# Patient Record
Sex: Male | Born: 1955
Health system: Southern US, Community
[De-identification: ages and names within clinical notes are randomized; demographics above are authoritative.]

## PROBLEM LIST (undated history)

## (undated) DIAGNOSIS — E1142 Type 2 diabetes mellitus with diabetic polyneuropathy: Secondary | ICD-10-CM

## (undated) DIAGNOSIS — IMO0001 Reserved for inherently not codable concepts without codable children: Secondary | ICD-10-CM

## (undated) DIAGNOSIS — E119 Type 2 diabetes mellitus without complications: Secondary | ICD-10-CM

## (undated) DIAGNOSIS — I6381 Other cerebral infarction due to occlusion or stenosis of small artery: Secondary | ICD-10-CM

## (undated) DIAGNOSIS — R059 Cough, unspecified: Secondary | ICD-10-CM

## (undated) DIAGNOSIS — J189 Pneumonia, unspecified organism: Secondary | ICD-10-CM

## (undated) DIAGNOSIS — F419 Anxiety disorder, unspecified: Secondary | ICD-10-CM

## (undated) DIAGNOSIS — Z8739 Personal history of other diseases of the musculoskeletal system and connective tissue: Secondary | ICD-10-CM

## (undated) DIAGNOSIS — C801 Malignant (primary) neoplasm, unspecified: Secondary | ICD-10-CM

## (undated) DIAGNOSIS — R05 Cough: Secondary | ICD-10-CM

## (undated) DIAGNOSIS — F329 Major depressive disorder, single episode, unspecified: Secondary | ICD-10-CM

## (undated) DIAGNOSIS — M1990 Unspecified osteoarthritis, unspecified site: Secondary | ICD-10-CM

## (undated) DIAGNOSIS — I639 Cerebral infarction, unspecified: Secondary | ICD-10-CM

## (undated) DIAGNOSIS — Z9989 Dependence on other enabling machines and devices: Secondary | ICD-10-CM

## (undated) DIAGNOSIS — Z86010 Personal history of colonic polyps: Principal | ICD-10-CM

## (undated) DIAGNOSIS — R55 Syncope and collapse: Secondary | ICD-10-CM

## (undated) DIAGNOSIS — F32A Depression, unspecified: Secondary | ICD-10-CM

## (undated) DIAGNOSIS — N189 Chronic kidney disease, unspecified: Secondary | ICD-10-CM

## (undated) DIAGNOSIS — E785 Hyperlipidemia, unspecified: Secondary | ICD-10-CM

## (undated) DIAGNOSIS — G4733 Obstructive sleep apnea (adult) (pediatric): Secondary | ICD-10-CM

## (undated) DIAGNOSIS — I10 Essential (primary) hypertension: Secondary | ICD-10-CM

## (undated) HISTORY — PX: KNEE ARTHROSCOPY: SHX127

## (undated) HISTORY — DX: Personal history of colonic polyps: Z86.010

## (undated) HISTORY — PX: HERNIA REPAIR: SHX51

---

## 1978-10-20 HISTORY — PX: APPENDECTOMY: SHX54

## 2002-02-18 DIAGNOSIS — E119 Type 2 diabetes mellitus without complications: Secondary | ICD-10-CM

## 2002-02-18 HISTORY — DX: Type 2 diabetes mellitus without complications: E11.9

## 2002-09-03 DIAGNOSIS — R7303 Prediabetes: Secondary | ICD-10-CM

## 2004-07-11 ENCOUNTER — Emergency Department (HOSPITAL_COMMUNITY): Admission: EM | Admit: 2004-07-11 | Discharge: 2004-07-11 | Payer: Self-pay | Admitting: Family Medicine

## 2004-07-23 ENCOUNTER — Ambulatory Visit: Payer: Self-pay | Admitting: *Deleted

## 2004-07-23 ENCOUNTER — Ambulatory Visit: Payer: Self-pay | Admitting: Internal Medicine

## 2004-08-06 ENCOUNTER — Ambulatory Visit: Payer: Self-pay | Admitting: Internal Medicine

## 2004-08-31 ENCOUNTER — Ambulatory Visit: Payer: Self-pay | Admitting: Internal Medicine

## 2004-11-02 ENCOUNTER — Ambulatory Visit: Payer: Self-pay | Admitting: Internal Medicine

## 2005-01-15 ENCOUNTER — Ambulatory Visit: Payer: Self-pay | Admitting: Internal Medicine

## 2005-04-23 ENCOUNTER — Ambulatory Visit: Payer: Self-pay | Admitting: Internal Medicine

## 2005-05-23 ENCOUNTER — Ambulatory Visit: Payer: Self-pay | Admitting: Internal Medicine

## 2005-06-27 ENCOUNTER — Ambulatory Visit: Payer: Self-pay | Admitting: Internal Medicine

## 2005-09-06 ENCOUNTER — Ambulatory Visit: Payer: Self-pay | Admitting: Internal Medicine

## 2005-10-18 ENCOUNTER — Ambulatory Visit: Payer: Self-pay | Admitting: Family Medicine

## 2005-11-01 ENCOUNTER — Ambulatory Visit: Payer: Self-pay | Admitting: Internal Medicine

## 2005-12-11 ENCOUNTER — Ambulatory Visit: Payer: Self-pay | Admitting: Internal Medicine

## 2006-01-21 ENCOUNTER — Ambulatory Visit: Payer: Self-pay | Admitting: Internal Medicine

## 2006-03-18 ENCOUNTER — Ambulatory Visit: Payer: Self-pay | Admitting: Internal Medicine

## 2006-03-21 ENCOUNTER — Ambulatory Visit: Payer: Self-pay | Admitting: Internal Medicine

## 2006-05-05 ENCOUNTER — Ambulatory Visit (HOSPITAL_BASED_OUTPATIENT_CLINIC_OR_DEPARTMENT_OTHER): Admission: RE | Admit: 2006-05-05 | Discharge: 2006-05-05 | Payer: Self-pay | Admitting: Internal Medicine

## 2006-05-11 ENCOUNTER — Ambulatory Visit: Payer: Self-pay | Admitting: Internal Medicine

## 2006-06-24 ENCOUNTER — Ambulatory Visit (HOSPITAL_BASED_OUTPATIENT_CLINIC_OR_DEPARTMENT_OTHER): Admission: RE | Admit: 2006-06-24 | Discharge: 2006-06-24 | Payer: Self-pay | Admitting: Internal Medicine

## 2006-06-28 ENCOUNTER — Ambulatory Visit: Payer: Self-pay | Admitting: Internal Medicine

## 2006-08-19 ENCOUNTER — Ambulatory Visit: Payer: Self-pay | Admitting: Internal Medicine

## 2006-11-05 ENCOUNTER — Encounter (INDEPENDENT_AMBULATORY_CARE_PROVIDER_SITE_OTHER): Payer: Self-pay | Admitting: *Deleted

## 2007-03-12 ENCOUNTER — Ambulatory Visit: Payer: Self-pay | Admitting: Internal Medicine

## 2007-04-23 ENCOUNTER — Ambulatory Visit: Payer: Self-pay | Admitting: Internal Medicine

## 2007-07-16 ENCOUNTER — Ambulatory Visit: Payer: Self-pay | Admitting: Internal Medicine

## 2007-07-16 LAB — CONVERTED CEMR LAB
Albumin: 4.6 g/dL (ref 3.5–5.2)
Alkaline Phosphatase: 80 units/L (ref 39–117)
CO2: 24 meq/L (ref 19–32)
Cholesterol: 170 mg/dL (ref 0–200)
Glucose, Bld: 134 mg/dL — ABNORMAL HIGH (ref 70–99)
Microalb, Ur: 0.74 mg/dL (ref 0.00–1.89)
Total Bilirubin: 0.3 mg/dL (ref 0.3–1.2)

## 2007-08-25 ENCOUNTER — Ambulatory Visit: Payer: Self-pay | Admitting: Internal Medicine

## 2007-10-06 ENCOUNTER — Ambulatory Visit: Payer: Self-pay | Admitting: Internal Medicine

## 2007-11-19 ENCOUNTER — Ambulatory Visit: Payer: Self-pay | Admitting: Internal Medicine

## 2007-11-19 HISTORY — PX: CAROTID ENDARTERECTOMY: SUR193

## 2007-11-23 ENCOUNTER — Ambulatory Visit: Payer: Self-pay | Admitting: Internal Medicine

## 2007-11-25 ENCOUNTER — Ambulatory Visit: Payer: Self-pay | Admitting: *Deleted

## 2007-11-25 ENCOUNTER — Inpatient Hospital Stay (HOSPITAL_COMMUNITY): Admission: EM | Admit: 2007-11-25 | Discharge: 2007-11-28 | Payer: Self-pay | Admitting: Emergency Medicine

## 2007-11-26 ENCOUNTER — Ambulatory Visit: Payer: Self-pay | Admitting: Vascular Surgery

## 2007-11-26 ENCOUNTER — Encounter (INDEPENDENT_AMBULATORY_CARE_PROVIDER_SITE_OTHER): Payer: Self-pay | Admitting: *Deleted

## 2007-11-27 ENCOUNTER — Encounter: Payer: Self-pay | Admitting: Vascular Surgery

## 2007-12-08 ENCOUNTER — Ambulatory Visit: Payer: Self-pay | Admitting: Vascular Surgery

## 2007-12-10 ENCOUNTER — Ambulatory Visit: Payer: Self-pay | Admitting: Internal Medicine

## 2008-03-10 ENCOUNTER — Ambulatory Visit: Payer: Self-pay | Admitting: Internal Medicine

## 2008-06-02 ENCOUNTER — Ambulatory Visit: Payer: Self-pay | Admitting: Internal Medicine

## 2008-06-02 LAB — CONVERTED CEMR LAB
ALT: 24 units/L (ref 0–53)
AST: 17 units/L (ref 0–37)
Albumin: 4.3 g/dL (ref 3.5–5.2)
Alkaline Phosphatase: 98 units/L (ref 39–117)
BUN: 19 mg/dL (ref 6–23)
Chloride: 106 meq/L (ref 96–112)
Ferritin: 47 ng/mL (ref 22–322)
Glucose, Bld: 68 mg/dL — ABNORMAL LOW (ref 70–99)
Iron: 71 ug/dL (ref 42–165)
LDL Cholesterol: 75 mg/dL (ref 0–99)
Magnesium: 2.1 mg/dL (ref 1.5–2.5)
Potassium: 5 meq/L (ref 3.5–5.3)
Saturation Ratios: 17 % — ABNORMAL LOW (ref 20–55)
Sodium: 141 meq/L (ref 135–145)
Total CHOL/HDL Ratio: 4.2
VLDL: 42 mg/dL — ABNORMAL HIGH (ref 0–40)
Vit D, 25-Hydroxy: 27 ng/mL — ABNORMAL LOW (ref 30–89)

## 2008-09-01 ENCOUNTER — Ambulatory Visit: Payer: Self-pay | Admitting: Internal Medicine

## 2008-10-27 ENCOUNTER — Ambulatory Visit: Payer: Self-pay | Admitting: Internal Medicine

## 2008-11-04 ENCOUNTER — Ambulatory Visit: Payer: Self-pay | Admitting: Family Medicine

## 2008-11-04 ENCOUNTER — Ambulatory Visit (HOSPITAL_COMMUNITY): Admission: RE | Admit: 2008-11-04 | Discharge: 2008-11-04 | Payer: Self-pay | Admitting: Internal Medicine

## 2008-11-04 ENCOUNTER — Encounter (INDEPENDENT_AMBULATORY_CARE_PROVIDER_SITE_OTHER): Payer: Self-pay | Admitting: Internal Medicine

## 2008-11-04 LAB — CONVERTED CEMR LAB
Basophils Absolute: 0 10*3/uL (ref 0.0–0.1)
Basophils Relative: 0 % (ref 0–1)
Eosinophils Relative: 2 % (ref 0–5)
HCT: 46.9 % (ref 39.0–52.0)
MCHC: 31.3 g/dL (ref 30.0–36.0)
MCV: 96.7 fL (ref 78.0–100.0)
Monocytes Absolute: 0.7 10*3/uL (ref 0.1–1.0)
Monocytes Relative: 7 % (ref 3–12)
Neutro Abs: 7.6 10*3/uL (ref 1.7–7.7)
Neutrophils Relative %: 72 % (ref 43–77)
WBC: 10.5 10*3/uL (ref 4.0–10.5)

## 2008-11-05 ENCOUNTER — Encounter (INDEPENDENT_AMBULATORY_CARE_PROVIDER_SITE_OTHER): Payer: Self-pay | Admitting: Internal Medicine

## 2008-11-07 ENCOUNTER — Ambulatory Visit: Payer: Self-pay | Admitting: Internal Medicine

## 2008-11-07 ENCOUNTER — Emergency Department (HOSPITAL_COMMUNITY): Admission: EM | Admit: 2008-11-07 | Discharge: 2008-11-07 | Payer: Self-pay | Admitting: Emergency Medicine

## 2008-11-10 ENCOUNTER — Ambulatory Visit: Payer: Self-pay | Admitting: Internal Medicine

## 2008-11-23 ENCOUNTER — Ambulatory Visit: Payer: Self-pay | Admitting: Internal Medicine

## 2008-11-23 LAB — CONVERTED CEMR LAB: Calcium: 9.5 mg/dL (ref 8.4–10.5)

## 2008-12-01 ENCOUNTER — Ambulatory Visit: Payer: Self-pay | Admitting: Internal Medicine

## 2009-03-16 ENCOUNTER — Ambulatory Visit: Payer: Self-pay | Admitting: Internal Medicine

## 2009-03-16 LAB — CONVERTED CEMR LAB
Cholesterol: 161 mg/dL (ref 0–200)
LDL Cholesterol: 89 mg/dL (ref 0–99)
Triglycerides: 227 mg/dL — ABNORMAL HIGH (ref <150)

## 2009-03-20 ENCOUNTER — Ambulatory Visit: Payer: Self-pay | Admitting: Internal Medicine

## 2009-05-24 ENCOUNTER — Ambulatory Visit: Payer: Self-pay | Admitting: Internal Medicine

## 2009-06-15 ENCOUNTER — Ambulatory Visit: Payer: Self-pay | Admitting: Vascular Surgery

## 2009-07-31 ENCOUNTER — Telehealth (INDEPENDENT_AMBULATORY_CARE_PROVIDER_SITE_OTHER): Payer: Self-pay | Admitting: *Deleted

## 2009-07-31 ENCOUNTER — Inpatient Hospital Stay (HOSPITAL_COMMUNITY)
Admission: EM | Admit: 2009-07-31 | Discharge: 2009-08-03 | Payer: Self-pay | Source: Home / Self Care | Admitting: Emergency Medicine

## 2009-07-31 ENCOUNTER — Ambulatory Visit: Payer: Self-pay | Admitting: Cardiology

## 2009-08-03 ENCOUNTER — Encounter: Payer: Self-pay | Admitting: Internal Medicine

## 2009-08-28 ENCOUNTER — Ambulatory Visit: Payer: Self-pay | Admitting: Internal Medicine

## 2010-02-06 ENCOUNTER — Encounter (INDEPENDENT_AMBULATORY_CARE_PROVIDER_SITE_OTHER): Payer: Self-pay | Admitting: Family Medicine

## 2010-02-06 LAB — CONVERTED CEMR LAB
ALT: 19 units/L (ref 0–53)
AST: 18 units/L (ref 0–37)
Albumin: 4.3 g/dL (ref 3.5–5.2)
Alkaline Phosphatase: 100 units/L (ref 39–117)
Cholesterol: 157 mg/dL (ref 0–200)
LDL Cholesterol: 94 mg/dL (ref 0–99)
Potassium: 5 meq/L (ref 3.5–5.3)
Sodium: 144 meq/L (ref 135–145)
Total CHOL/HDL Ratio: 5.1
Total Protein: 6.9 g/dL (ref 6.0–8.3)
Triglycerides: 158 mg/dL — ABNORMAL HIGH (ref <150)

## 2010-02-20 ENCOUNTER — Ambulatory Visit (HOSPITAL_BASED_OUTPATIENT_CLINIC_OR_DEPARTMENT_OTHER)
Admission: RE | Admit: 2010-02-20 | Discharge: 2010-02-20 | Payer: Self-pay | Source: Home / Self Care | Attending: Family Medicine | Admitting: Family Medicine

## 2010-02-21 ENCOUNTER — Ambulatory Visit (HOSPITAL_COMMUNITY)
Admission: RE | Admit: 2010-02-21 | Discharge: 2010-02-21 | Payer: Self-pay | Source: Home / Self Care | Attending: Family Medicine | Admitting: Family Medicine

## 2010-03-11 ENCOUNTER — Encounter: Payer: Self-pay | Admitting: *Deleted

## 2010-03-20 NOTE — Progress Notes (Signed)
Summary: triage/chest and left shoulder pain  Phone Note Call from Patient   Caller: Spouse Reason for Call: Talk to Nurse Summary of Call: patient's wife states her husband  has been hurting day and night in his left shoulder. Patient is not aware of any injuries or strains to shoulder. He has a history of stroke and states the pain radiates to his chest at times with severe chest pain and he becomes SOB.Marland KitchenMarland KitchenHe says his left hand feels like it goes to sleep alot.. Wife advised to take patient to ED for evaluation.  She stated she would do so. Initial call taken by: Conchita Paris,  July 31, 2009 2:40 PM

## 2010-03-20 NOTE — Consult Note (Signed)
Summary: [facility]  [facility]   Imported By: Marylou Mccoy 09/12/2009 16:23:34  _____________________________________________________________________  External Attachment:    Type:   Image     Comment:   External Document

## 2010-04-13 ENCOUNTER — Other Ambulatory Visit: Payer: Self-pay | Admitting: Emergency Medicine

## 2010-04-16 ENCOUNTER — Encounter: Payer: Self-pay | Admitting: Internal Medicine

## 2010-04-16 LAB — CONVERTED CEMR LAB
Amylase: 64 units/L (ref 0–105)
Helicobacter Pylori Antibody-IgG: 3.41 — ABNORMAL HIGH
Lipase: 45 units/L (ref 0–75)

## 2010-04-24 ENCOUNTER — Ambulatory Visit
Admission: RE | Admit: 2010-04-24 | Discharge: 2010-04-24 | Disposition: A | Discharge status: Home / Self Care | Payer: Self-pay | Source: Ambulatory Visit | Attending: Internal Medicine | Admitting: Internal Medicine

## 2010-05-06 LAB — BASIC METABOLIC PANEL
CO2: 28 mEq/L (ref 19–32)
Calcium: 9.5 mg/dL (ref 8.4–10.5)
Chloride: 105 mEq/L (ref 96–112)
GFR calc Af Amer: >60 mL/min (ref >60)
Sodium: 138 mEq/L (ref 135–145)

## 2010-05-06 LAB — COMPREHENSIVE METABOLIC PANEL
AST: 21 U/L (ref 0–37)
Albumin: 3.4 g/dL — ABNORMAL LOW (ref 3.5–5.2)
CO2: 28 mEq/L (ref 19–32)
Calcium: 9.6 mg/dL (ref 8.4–10.5)
Chloride: 106 mEq/L (ref 96–112)
Creatinine, Ser: 1.36 mg/dL (ref 0.4–1.5)
GFR calc Af Amer: >60 mL/min (ref >60)
GFR calc non Af Amer: 55 mL/min — ABNORMAL LOW (ref >60)
Potassium: 5.3 mEq/L — ABNORMAL HIGH (ref 3.5–5.1)
Total Protein: 6.8 g/dL (ref 6.0–8.3)

## 2010-05-06 LAB — CBC
HCT: 45.6 % (ref 39.0–52.0)
MCV: 94 fL (ref 78.0–100.0)
RBC: 4.69 MIL/uL (ref 4.22–5.81)
RDW: 14.3 % (ref 11.5–15.5)
WBC: 10.3 10*3/uL (ref 4.0–10.5)
WBC: 12.9 10*3/uL — ABNORMAL HIGH (ref 4.0–10.5)

## 2010-05-06 LAB — GLUCOSE, CAPILLARY
Glucose-Capillary: 139 mg/dL — ABNORMAL HIGH (ref 70–99)
Glucose-Capillary: 76 mg/dL (ref 70–99)

## 2010-05-06 LAB — PROTIME-INR: Prothrombin Time: 12.9 seconds (ref 11.6–15.2)

## 2010-05-07 LAB — DIFFERENTIAL
Eosinophils Absolute: 0.3 10*3/uL (ref 0.0–0.7)
Lymphocytes Relative: 19 % (ref 12–46)
Lymphs Abs: 1.9 10*3/uL (ref 0.7–4.0)
Monocytes Relative: 6 % (ref 3–12)
Neutrophils Relative %: 72 % (ref 43–77)

## 2010-05-07 LAB — CARDIAC PANEL(CRET KIN+CKTOT+MB+TROPI)
CK, MB: 1.9 ng/mL (ref 0.3–4.0)
Relative Index: INVALID (ref 0.0–2.5)
Relative Index: INVALID (ref 0.0–2.5)
Troponin I: 0.02 ng/mL (ref 0.00–0.06)
Troponin I: 0.02 ng/mL (ref 0.00–0.06)

## 2010-05-07 LAB — GLUCOSE, CAPILLARY
Glucose-Capillary: 168 mg/dL — ABNORMAL HIGH (ref 70–99)
Glucose-Capillary: 76 mg/dL (ref 70–99)
Glucose-Capillary: 85 mg/dL (ref 70–99)
Glucose-Capillary: 93 mg/dL (ref 70–99)
Glucose-Capillary: 99 mg/dL (ref 70–99)

## 2010-05-07 LAB — DRUGS OF ABUSE SCREEN W/O ALC, ROUTINE URINE
Amphetamine Screen, Ur: NEGATIVE
Barbiturate Quant, Ur: NEGATIVE
Benzodiazepines.: POSITIVE — AB
Cocaine Metabolites: NEGATIVE
Marijuana Metabolite: POSITIVE — AB
Opiate Screen, Urine: NEGATIVE

## 2010-05-07 LAB — BLOOD GAS, ARTERIAL
Acid-Base Excess: 0.3 mmol/L (ref 0.0–2.0)
Bicarbonate: 25.2 mEq/L — ABNORMAL HIGH (ref 20.0–24.0)
FIO2: 0.21 %
O2 Saturation: 93.3 %
Patient temperature: 98.6
pO2, Arterial: 67.2 mmHg — ABNORMAL LOW (ref 80.0–100.0)

## 2010-05-07 LAB — THC (MARIJUANA), URINE, CONFIRMATION: Marijuana, Ur-Confirmation: 38 NG/ML — ABNORMAL HIGH

## 2010-05-07 LAB — CK TOTAL AND CKMB (NOT AT ARMC): Relative Index: INVALID (ref 0.0–2.5)

## 2010-05-07 LAB — LIPID PANEL
HDL: 31 mg/dL — ABNORMAL LOW (ref >39)
Total CHOL/HDL Ratio: 5.3 RATIO
VLDL: 69 mg/dL — ABNORMAL HIGH (ref 0–40)

## 2010-05-07 LAB — HEPARIN LEVEL (UNFRACTIONATED): Heparin Unfractionated: <0.1 IU/mL — ABNORMAL LOW (ref 0.30–0.70)

## 2010-05-07 LAB — BASIC METABOLIC PANEL
Chloride: 106 mEq/L (ref 96–112)
Creatinine, Ser: 1.87 mg/dL — ABNORMAL HIGH (ref 0.4–1.5)
GFR calc Af Amer: 46 mL/min — ABNORMAL LOW (ref >60)
GFR calc non Af Amer: 38 mL/min — ABNORMAL LOW (ref >60)
Potassium: 4.6 mEq/L (ref 3.5–5.1)

## 2010-05-07 LAB — HEMOGLOBIN A1C: Hgb A1c MFr Bld: 5.5 % (ref <5.7)

## 2010-05-07 LAB — POCT CARDIAC MARKERS
CKMB, poc: 1.2 ng/mL (ref 1.0–8.0)
Myoglobin, poc: 80.9 ng/mL (ref 12–200)
Troponin i, poc: <0.05 ng/mL (ref 0.00–0.09)

## 2010-05-07 LAB — CBC
MCV: 93.5 fL (ref 78.0–100.0)
RBC: 4.74 MIL/uL (ref 4.22–5.81)
WBC: 10.5 10*3/uL (ref 4.0–10.5)

## 2010-05-07 LAB — BENZODIAZEPINE, QUANTITATIVE, URINE: Lorazepam UR QT: NEGATIVE NG/ML

## 2010-05-07 LAB — MRSA PCR SCREENING: MRSA by PCR: NEGATIVE

## 2010-05-07 LAB — TSH: TSH: 2.906 u[IU]/mL (ref 0.350–4.500)

## 2010-05-25 LAB — CBC
HCT: 44.8 % (ref 39.0–52.0)
MCHC: 32.6 g/dL (ref 30.0–36.0)
MCV: 93.5 fL (ref 78.0–100.0)
Platelets: 219 10*3/uL (ref 150–400)
WBC: 10.7 10*3/uL — ABNORMAL HIGH (ref 4.0–10.5)

## 2010-05-25 LAB — WOUND CULTURE: Gram Stain: NONE SEEN

## 2010-05-25 LAB — POCT I-STAT, CHEM 8
BUN: 30 mg/dL — ABNORMAL HIGH (ref 6–23)
Calcium, Ion: 1.09 mmol/L — ABNORMAL LOW (ref 1.12–1.32)
Chloride: 108 mEq/L (ref 96–112)
HCT: 47 % (ref 39.0–52.0)
Potassium: 4.6 mEq/L (ref 3.5–5.1)

## 2010-05-25 LAB — DIFFERENTIAL
Basophils Absolute: 0 10*3/uL (ref 0.0–0.1)
Eosinophils Absolute: 0.2 10*3/uL (ref 0.0–0.7)
Eosinophils Relative: 2 % (ref 0–5)
Lymphs Abs: 1.7 10*3/uL (ref 0.7–4.0)

## 2010-06-07 ENCOUNTER — Other Ambulatory Visit (INDEPENDENT_AMBULATORY_CARE_PROVIDER_SITE_OTHER): Payer: Self-pay

## 2010-06-07 DIAGNOSIS — I6529 Occlusion and stenosis of unspecified carotid artery: Secondary | ICD-10-CM

## 2010-06-07 DIAGNOSIS — Z48812 Encounter for surgical aftercare following surgery on the circulatory system: Secondary | ICD-10-CM

## 2010-06-13 NOTE — Procedures (Unsigned)
CAROTID DUPLEX EXAM  INDICATION:  Carotid endarterectomy.  HISTORY: Diabetes:  Yes. Cardiac:  No. Hypertension:  Yes. Smoking:  Yes. Previous Surgery:  Left carotid endarterectomy on 11/27/2007. CV History:  History of possible stroke prior to surgery, currently asymptomatic. Amaurosis Fugax No, Paresthesias No, Hemiparesis No                                      RIGHT             LEFT Brachial systolic pressure:         130               142 Brachial Doppler waveforms:         Normal            Normal Vertebral direction of flow:        Antegrade         Antegrade DUPLEX VELOCITIES (cm/sec) CCA peak systolic                   67                72 ECA peak systolic                   141               104 ICA peak systolic                   78                57 ICA end diastolic                   28                23 PLAQUE MORPHOLOGY:                  Heterogeneous PLAQUE AMOUNT:                      Mild              None PLAQUE LOCATION:                    ICA / bifurcation  IMPRESSION: 1. No hemodynamically significant stenosis of the right proximal     internal carotid artery with plaque formations as described above. 2. Patent left carotid endarterectomy site with no left internal     carotid artery stenosis. 3. No significant change noted when compared to the previous exam on     06/15/2009.  ___________________________________________ Di Kindle. Edilia Bo, M.D.  CH/MEDQ  D:  06/08/2010  T:  06/08/2010  Job:  [id number]

## 2010-07-03 NOTE — Procedures (Signed)
CAROTID DUPLEX EXAM   INDICATION:  Follow up carotid artery disease.   HISTORY:  Diabetes:  Yes.  Cardiac:  No.  Hypertension:  Yes.  Smoking:  Quit.  Previous Surgery:  Left CEA with DPA on 11/27/07 by Dr. Edilia Bo.  CV History:  Right-sided stroke, still symptomatic; however, improving,  per patient.  Amaurosis Fugax No, Paresthesias Yes, Hemiparesis Yes.                                       RIGHT             LEFT  Brachial systolic pressure:         142               140  Brachial Doppler waveforms:         WNL               WNL  Vertebral direction of flow:        Not visualized    Antegrade  DUPLEX VELOCITIES (cm/sec)  CCA peak systolic                   128               122  ECA peak systolic                   166               255  ICA peak systolic                   118               135 (distal)  ICA end diastolic                   51                45  PLAQUE MORPHOLOGY:                  Homogenous        N/A  PLAQUE AMOUNT:                      Mild              N/A  PLAQUE LOCATION:                    ICA               ECA   IMPRESSION:  1. Right internal carotid artery velocities are suggestive of 40-59%      stenosis (low end of range; however, no significant plaque      visualized).  2. Left internal carotid artery velocities distal to patch are      suggestive of 40-59% stenosis (low end of range; however, no plaque      visualized).  No evidence of restenosis in patch area.  3. Left external carotid artery stenosis.  4. Left vertebral artery is antegrade.  Right is not visualized;      however, may be due to body habitus.   ___________________________________________  Di Kindle. Edilia Bo, M.D.   AS/MEDQ  D:  06/07/2008  T:  06/07/2008  Job:  [id number]

## 2010-07-03 NOTE — Procedures (Signed)
CAROTID DUPLEX EXAM   INDICATION:  Followup known carotid artery disease.   HISTORY:  Diabetes:  Yes.  Cardiac:  No.  Hypertension:  Yes.  Smoking:  Yes.  Previous Surgery:  Left carotid endarterectomy with DPA on 11/27/2007 by  Dr. Edilia Bo.  CV History:  CVA right-sided, still symptomatic but improving.  Amaurosis Fugax No, Paresthesias Yes, Hemiparesis Yes                                       RIGHT             LEFT  Brachial systolic pressure:         100               120  Brachial Doppler waveforms:         Triphasic         Triphasic  Vertebral direction of flow:        Unable to visualize                 Antegrade  DUPLEX VELOCITIES (cm/sec)  CCA peak systolic                   81                104  ECA peak systolic                   171               293  ICA peak systolic                   117               113  ICA end diastolic                   54                51  PLAQUE MORPHOLOGY:                  Homogeneous       NA  PLAQUE AMOUNT:                      Mild              NA  PLAQUE LOCATION:                    ICA               ECA   IMPRESSION:  1. Right internal carotid artery velocities are suggestive of 40%-59%      stenosis (low end of range; however, no significant plaque      visualized).  2. Left internal carotid artery velocities distal to the patch are      suggestive of 40%-59% stenosis (low end of range; however, no      plaque visualized).  No evidence of restenosis in patch area.  3. Left external carotid artery stenosis.  4. Left vertebral is antegrade.  Right is not visualized, however, may      be due to body habitus.  5. Noticeable difference in blood pressures with a drop on the right      side.       ___________________________________________  Di Kindle. Edilia Bo, M.D.  NT/MEDQ  D:  06/15/2009  T:  06/15/2009  Job:  [id number]

## 2010-07-03 NOTE — Assessment & Plan Note (Signed)
OFFICE VISIT   [name], [name]  DOB:  1955/08/26                                       12/08/2007  MVHQI#:[id number]   I saw the patient in the office today for followup after his recent left  carotid endarterectomy.  This is a pleasant 55 year old gentleman who  had an episode of dysarthria which lasted approximately 15 minutes and  resolved.  He had a subsequent episode and was admitted when he  developed right upper extremity weakness and clumsiness on 11/26/2007.  He was fount to have a evidence of stroke in the left frontal parietal  region on his CT scan.  Carotid duplex scan showed a greater than 80%  left carotid stenosis.  Left carotid endarterectomy was recommended in  order to lower his risk of future stroke.   He underwent a left carotid endarterectomy with Dacron patch angioplasty  and 11/27/2007.  He did well postoperatively and was discharged on  postoperative day #1.  Returns for his first followup visit.  Overall he  has been doing quite well and states that the strength in his upper  extremity is improving on the right.  He has had no focal weakness or  paresthesias.   PHYSICAL EXAMINATION:  Blood pressure is 137/82, heart rate is 104.  His  incision is healing nicely.  His strength in the right upper extremity  has improved.  Lungs are clear bilaterally to auscultation.  Cardiac  exam, he has a regular rate and rhythm.   Overall, I am pleased with his progress.  I will see him back in 6  months for a followup duplex scan.  He knows to call sooner if he has  problems.  In the meantime he knows to continue taking his aspirin. Of  note, he has been on Aggrenox and I have told him, from my standpoint,  he does not need to continue with his Aggrenox.  He will discuss this  with Dr. Reche Dixon also.   Di Kindle. Edilia Bo, M.D.  Electronically Signed   CSD/MEDQ  D:  12/08/2007  T:  12/09/2007  Job:  [id number]   cc:   Dineen Kid. Reche Dixon,  M.D.

## 2010-07-03 NOTE — Assessment & Plan Note (Signed)
OFFICE VISIT   [name], [name]  DOB:  1955/10/09                                       06/07/2008  [id number]#:[id number]   I saw the patient in the office today for continued followup of his  carotid disease.  He had undergone a left carotid endarterectomy in  October of 2009 for symptomatic left carotid stenosis.  He comes in for  a 6 month followup visit.  Since I saw him last he has had no history of  stroke, TIAs, expressive or receptive aphasia or amaurosis fugax.  This  has been no significant change in his medical history since October of  2009.   REVIEW OF SYSTEMS:  He has had no recent chest pain, chest pressure,  palpitations or arrhythmias.  He does admit to dyspnea on exertion.   PHYSICAL EXAMINATION:  This is a pleasant 55 year old gentleman who  appears his stated age.  He has morbid obesity.  Blood pressure is  159/86, heart rate is 90.  Neck is supple.  I do not detect any carotid  bruits.  His lungs are clear bilaterally to auscultation.  On cardiac  exam he has a regular rate and rhythm.  Neurologic exam is nonfocal.   Carotid duplex scan shows no evidence of recurrent carotid stenosis on  the left.  He has a 40-59% right carotid stenosis.   As he is asymptomatic he understands we would not consider right carotid  endarterectomy unless the stenosis progressed to greater than 80%.  I  will see him back in 1 year for followup duplex scan.  He knows to call  sooner if he has problems.  In the meantime he knows to continue taking  his aspirin.   Di Kindle. Edilia Bo, M.D.  Electronically Signed   CSD/MEDQ  D:  06/07/2008  T:  06/08/2008  Job:  [id number]   cc:   Dineen Kid. Reche Dixon, M.D.

## 2010-07-03 NOTE — Op Note (Signed)
[name], [name]           ACCOUNT NO.:  [id number]   MEDICAL RECORD NO.:  [id number]          PATIENT TYPE:  INP   LOCATION:  3309                         FACILITY:  [facility]   PHYSICIAN:  Di Kindle. Edilia Bo, M.D.DATE OF BIRTH:  [birth year]   DATE OF PROCEDURE:  11/27/2007  DATE OF DISCHARGE:                               OPERATIVE REPORT   PREOPERATIVE DIAGNOSIS:  Symptomatic left carotid stenosis.   POSTOPERATIVE DIAGNOSIS:  Symptomatic left carotid stenosis.   PROCEDURES:  Left carotid endarterectomy with Dacron patch angioplasty.   SURGEON:  Di Kindle. Edilia Bo, MD   ASSISTANT:  Johny Shears, RNFA.   ANESTHESIA:  General.   INDICATIONS:  This is a 55 year old gentleman who a week ago had an  episode of dysarthria which lasted 15 minutes and resolved.  He had a  subsequent episode.  Four days prior to admission, he had developed  right upper extremity weakness and clumsiness.  He was admitted on  November 26, 2007, and was found to have subtle area of low attenuation in  the left frontal parietal area consistent with possible stroke.  Carotid  duplex scan showed a greater than 80% left carotid stenosis.  Left  carotid endarterectomy was recommended in order to lower his risk of  future stroke.   TECHNIQUE:  The patient was taken to the operating room and received a  general anesthetic.  Arterial line had been placed by anesthesia.  The  neck and upper chest were prepped and draped in the usual sterile  fashion on the left.  An incision was made along the anterior border of  the sternocleidomastoid and dissection was carried down to the common  carotid artery which was dissected free and controlled Rumel tourniquet.  Of note, the patient was morbidly obese and had a very thick neck.  The  dissection was quite deep.  The facial vein was divided between 2-0 silk  ties.  The internal carotid artery was controlled above the plaque.  It  was fairly small.  The external  carotid artery was controlled with blue  vessel loop.  The patient received 1200 units of heparin.  The internal,  then the external, and then the common carotid arteries were clamped.  A  longitudinal arteriotomy was made in the common carotid artery.  This  was extended through this soft hemorrhagic plaque into the internal  carotid artery.  A 10 shunt was placed into the internal carotid artery  backbled and then placed in the common carotid artery and secured with  Rumel tourniquet.  Flow was reestablished to the shunt.  Endarterectomy  plane was established proximally and the plaque was sharply divided.  Eversion endarterectomy was performed of the external carotid artery.  Distally, there was a nice taper in the plaque and 2 tacking sutures  were placed.  The artery was irrigated with copious amounts of heparin  and dextran and all loose debris was removed.  Dacron patch was then  sewn using continuous 6-0 Prolene suture.  Prior to completing the patch  closure, the shunt was removed.  The arteries were backbled and flushed  appropriately and the  anastomosis was completed.  Flow was reestablished  first to the external carotid artery and into the internal carotid  artery.  At the completion, there was good pulse distal to the patch and  a good Doppler signal with good diastolic flow.  Heparin was partially  reversed with protamine.  The wound was then closed with a deep layer of  3-0 Vicryl.  The platysma was closed with running 3-0 Vicryl.  A third  layer was a subcutaneous layer  of 3-0 Vicryl and then the skin was closed with 4-0 subcuticular stitch.  Sterile dressing was applied.  The patient tolerated the procedure well  and woke at his neurologic baseline with right upper extremity weakness,  which he had preoperatively.      Di Kindle. Edilia Bo, M.D.  Electronically Signed     CSD/MEDQ  D:  11/27/2007  T:  11/27/2007  Job:  [id number]   cc:   Manning Charity, MD

## 2010-07-03 NOTE — Discharge Summary (Signed)
NAMEMATTHER, LABELL NO.:  [id number]   MEDICAL RECORD NO.:  [id number]          PATIENT TYPE:  INP   LOCATION:  3309                         FACILITY:  [facility]   PHYSICIAN:  Manning Charity, MD     DATE OF BIRTH:  September 15, 1955   DATE OF ADMISSION:  11/25/2007  DATE OF DISCHARGE:  11/28/2007                               DISCHARGE SUMMARY   DISCHARGE DIAGNOSES:  1. Stroke of the left anterior parietal region resulting in right hand      numbness, weakness, and incoordination.  2. Hypertension.  3. Chronic renal injury.  4. Type 2 diabetes.  5. Obstructive sleep apnea.  6. Diabetic neuropathy.   DISCHARGE MEDICATIONS:  1. Byetta 5 units twice daily.  2. Neurontin 100 mg 3 times daily.  3. Hydrochlorothiazide 25 mg daily.  4. Allegra 180 mg daily.  5. Metformin 1000 mg twice daily.  6. Glucotrol 5 mg daily.  7. Naproxen 500 mg nightly as needed for jaw pain.  8. Wellbutrin 150 mg daily.  9. Aggrenox 1 tablet twice daily.  10.Tylenol 1-2 tablets every 4 hours as needed for pain.  The patient was given instructions to stop aspirin 81 mg daily, while on  Aggrenox.   DISPOSITION AND FOLLOWUP:  The patient was given the phone number for  Dr. Edilia Bo, phone number [phone].  He was told he would need an  appointment in the next 2-3 weeks.  Dr. Adele Dan office will arrange  the appointment.  He was told to call that number for any redness,  drainage from the incision site, fever greater than 101, or neurological  change.  The reason for this appointment is to follow up after his left  carotid endarterectomy.  In addition, the patient was asked to call  [facility] to see Dr. Reche Dixon within 1-2 weeks of discharge.  At that  time, it would be important to reassess the patient's chronic issues  including hypertension and renal insufficiency.  He had a slight bump in  his creatinine on admission at 1.56.  He was back at his baseline at  discharge, but it might be  important to recheck his BMET, also as he has  type 2 diabetes, recheck his sugars, and also reassess for obstructive  sleep apnea.  The patient has a BiPAP machine at home, he is not  currently using it.  It might be important to reassess the parameters  there as well.   PROCEDURES PERFORMED:  The patient had a left carotid endarterectomy  during this hospitalization that took place on Jun 27, 2007, after the  carotid Doppler showed a greater than 80% occlusion of the left internal  carotid artery.  Procedure was performed by Dr. Edilia Bo.   CONSULTATIONS:  Dr. Edilia Bo ([facility]).   HISTORY OF PRESENT ILLNESS:  This is a 55 year old male with past  medical history of hypertension, hyperlipidemia, and diabetes type 2.  He is a morbidly obese man who presents with a 6-day history of right  hand numbness that progressed to right hand weakness x2 days ago.  The  patient notes inability to hold pen and utensils with  right hand since  Monday.  In addition, the patient has noted slurred speech initially  that has improved.  The patient's wife adds that she noticed decreased  memory and mental processing since Friday.  There was no facial droop  detected.  The patient endorses balance abnormalities, but not favoring  any 1 side.  The patient also notes left-sided headache that is  retroorbital and throbbing with photophobia and phonophobia.  He has no  history of migraines.  His headache began on Friday prior to his other  symptoms.  He has had no similar symptoms in the past and no prior  hospitalizations.   PHYSICAL EXAMINATION:  VITAL SIGNS:  T 98.0, BP 138/88, P 116, RR 18, O2  sat 94% on room air.  GENERAL:  The patient is in no acute distress.  He is a morbidly obese  man.  EYES:  Pupils are round and reactive to light.  Right pupil is  approximately 3 mm and the left pupil was approximately 2 mm.  His  extraocular muscles are intact.  Slight ptosis of the right eye noted.  ENT:   Oropharynx clear, Mallampati III, edentulous, and he had moist  mucous membranes.  NECK:  Obese neck with no thyromegaly and no JVD present.  RESPIRATORY:  Lungs are clear to auscultation bilaterally with poor  inspiratory effort.  CV:  The patient is tachycardic, but no murmurs, rubs, or gallops were  noted.  His peripheral pulses were 1+ bilaterally.  GI:  Soft, obese abdomen, nontender, and nondistended with positive  bowel sounds.  EXTREMITIES:  There is no edema.  SKIN:  Normal skin turgor.  No rashes noted.  NEUROLOGIC:  The patient is alert and oriented x3.  Cranial nerves II  through XII are intact except he has decreased SSP in the right lower  two-thirds of the face.  He has decreased blink to threat on the right.  His muscle strength is 4/5 in the right upper extremity mainly reserved  to his grip strength, otherwise he has 5/5 in all 4 extremities with no  pronator drift.  CEREBELLAR:  He is intact by finger-to-nose and heel-to-shin.  He had  downgoing toes on Babinski.  He had decreased sensation in his legs and  right hand, indiscriminate dull-to-sharp differentiation.  He had a  negative Romberg.  His DTRs were +1, but symmetric.   ADMITTING LABORATORY DATA:  Sodium of 135, potassium 4.4, chloride of  104, bicarb 23, BUN 24, creatinine 1.56, glucose 149, calcium is 9.6.  LFTs were bilirubin 0.5, alk phos 77, AST 22, ALT 28, total protein 6.7,  and albumin of 3.7.  He had a white count of 9.6, hemoglobin of 14.7  with a crit of 44.3, his platelets were 214.  His ANC was 7.5 and his  MCV was 91.2.  His coags were normal with an INR of 0.9 and a PTT of 26.  A CT of the head which showed an age indeterminate, advanced small  vessel disease with areas of remote ischemia.  In addition, there was a  subtle area of hypoattenuation within the left frontoparietal area that  may relate to his symptoms and may be consistent with ischemia.  He had  an EKG with poor R-wave  progression, but normal sinus rhythm and normal  rate.  No old EKGs were available for comparison.   HOSPITAL COURSE:  1. Right arm paraesthesia, weakness, and incoordination.  The patient      presented with these  symptoms that were suspicious for a CVA.  He      had a CT of the head which showed hypoattenuation in the left      frontoparietal region that would be consistent with these findings      on physical exam.  He had no known history of AFib or palpitations,      but multiple risk factors including diabetes, hyperlipidemia.      During his hospitalization, he underwent testing for his stroke      protocol.  Of note, he had cardiac biomarkers that were negative      x3.  A repeat EKG showed new first degree AV block.  He was placed      on tele and on his first night, had bradycardia into the 20s and      30s for about 6-12 seconds.  This is nonsustained.  There was      question if the leads had been removed temporarily or were not      attached properly.  He underwent further risk stratification for      secondary prevention.  He had a fasting lipid panel, which      initially showed elevated triglycerides greater than 400. This was      repeated the next day with triglycerides still elevated at 265 and      an HDL of 25; however, his LDL was 86.  His hemoglobin A1c was      6.9%.  His TSH was normal.  He had carotid Dopplers, which showed      greater than 80% occlusion of the left carotid artery.  He had 2-D      echo which had evidence of left ventricular diastolic dysfunction,      but there was normal left ventricular ejection fraction of 60%.  He      had borderline normal left ventricular hypertrophy, but no other      abnormalities were noted.  An MRI and MRA could not be performed      due to the patient's body habitus.  Therefore, he underwent a      repeat head CT prior to discharge which had no new findings.  Given      that the patient had failed aspirin therapy,  he was started on      Aggrenox and Vascular Surgery was consulted and after discussion      with the patient and his family, the decision was made to perform a      left carotid endarterectomy during this hospitalization.  He was      believed to be at low risk given his minimal symptoms prior to the      procedure.  He did well following the procedure and was discharged      the next day with exercises for home to improve his strength.  He      was cleared by Speech, PT, and OT and he required no home health at      this time.  2. Hypertension.  Overall, he is well controlled during his      hospitalization.  We held his ACE inhibitor and hydrochlorothiazide      as he had an elevated creatinine.  These medications were restarted      upon discharge as his creatinine did return to baseline.  Further      management should be taken up by his primary care Zavien Clubb at  [facility].  3. Chronic renal insufficiency.  Per records obtained from      [facility], his baseline creatinine was 1.4, it was slightly      elevated at admission of 1.56 and by his discharge it was back to      baseline.  4. Type 2 diabetes.  The patient was placed on sliding scale insulin      while in the hospital and his home regimen was held.  He was placed      on a carb-monitored heart-healthy diet and overall did well.  His      home medications were restarted prior to discharge.  5. Obstructive sleep apnea.  The patient was offered CPAP while he was      in the hospital, however, he used it only intermittently and then      refused.  He was placed on the tele and was noted to have      bradycardia into the 20s or 30s that was unsustained and      asymptomatic.  It lasted about 6-12 seconds.  There is question if      the leads were placed on or if this was secondary to bradycardia      from his obstructive sleep apnea.  This should be reassessed with      an outpatient care Zamarian Scarano.  6. Diabetic  neuropathy.  He was continued on Neurontin during his      hospitalization and had no complaints.   DISCHARGE VITALS:  T-max of 37.4, systolic blood pressure of 113-132,  diastolic blood pressure of 53-94 with pulse of 89-104, and a  respiratory rate of 13-21.  He was sating at 96% on room air.   DISCHARGE LABORATORY DATA:  CBC with a white count of 14.7, hemoglobin  of 13.9 and a crit of 42.1 with a platelet count of 226.  He also had  chemistries with sodium 138, potassium 4.6, chloride 103, bicarb 27, BUN  14, and creatinine 1.4 with a glucose of 150.   Hollace Hayward, MS IV      Carlus Pavlov, M.D.  Electronically Signed      Manning Charity, MD  Electronically Signed    CG/MEDQ  D:  12/02/2007  T:  12/03/2007  Job:  [id number]   cc:   Di Kindle. Edilia Bo, M.D.

## 2010-07-03 NOTE — Consult Note (Signed)
[name], [name]           ACCOUNT NO.:  [id number]   MEDICAL RECORD NO.:  [id number]          PATIENT TYPE:  INP   LOCATION:  3712                         FACILITY:  [facility]   PHYSICIAN:  Di Kindle. Edilia Bo, M.D.DATE OF BIRTH:  May 05, 1955   DATE OF CONSULTATION:  11/26/2007  DATE OF DISCHARGE:                                 CONSULTATION   REFERRED BY:  Internal medicine teaching service.   REASON FOR CONSULTATION:  Symptomatic left carotid stenosis.   HISTORY:  This is a pleasant 55 year old gentleman who approximately a  week ago had an episode of transient dysarthria which lasted  approximately 15 minutes.  He has had 1 subsequent episode in the last  week.  He also noted some weakness in the right upper extremity on  Monday which was 4 days ago, and then when he went to sign a check, he  noted that his arm was not functioning well.  He has had clumsiness in  the arm, weakness and paresthesias in the arm for the last 4 days.  He  was admitted today for further workup.  He is unaware of any previous  history of stroke, TIAs or amaurosis fugax.  He did have a previous  episode of dysarthria approximately 3 months ago.  He has been on  aspirin.   The patient was admitted and underwent an extensive workup including CT  scan and duplex scan of the carotids which showed a greater than 80%  left carotid stenosis.  Carotid vascular surgery was consulted for  further recommendations.   PAST MEDICAL HISTORY:  1. Non-insulin-dependent diabetes which he has had for about 3 years.  2. Hypertension.  3. Hypercholesterolemia.  4. Morbid obesity.  5. He denies any history of previous myocardial infarction, history of      congestive heart failure, or history of COPD.  6. Diabetic neuropathy.   PAST SURGICAL HISTORY:  Significant for surgery on his knee and  appendectomy.   SOCIAL HISTORY:  He is married.  He has 1 child.  He smokes 1/2 pack per  day of cigarettes and has  been smoking for 30 years.   FAMILY HISTORY:  His father had coronary artery disease, but he is not  sure of what age.  He is unaware of any history of premature  cardiovascular disease.   MEDICATIONS ON ADMISSION:  1. Neurontin 100 mg p.o. t.i.d.  2. Lipitor 20 mg p.o. daily.  3. Hydrochlorothiazide.  4. Allegra.  5. Metformin 100 mg p.o. b.i.d.  6. Glucotrol 5 mg p.o. daily.  7. Naprosyn 500 mg p.o. daily.  8. Aspirin 81 mg p.o. daily.  9. He is been on Wellbutrin to try to quit smoking 150 mg a day.  10.He takes Excedrin and Tylenol p.r.n. for headache.   REVIEW OF SYSTEMS:  GENERAL:  He has had no recent weight loss, weight  gain, problem with his appetite, fever or chills.  CARDIAC:  He had no  chest pain, chest pressure, palpitations or arrhythmias.  He denies  orthopnea.  PULMONARY:  He does have a history of sleep apnea but does  not wear  his BiPAP.  He has had no recent productive cough, bronchitis,  asthma or wheezing.  GI:  He has had no recent change in his bowel  habits and has no history of peptic ulcer disease.  GU:  He has had no  dysuria or frequency.  ENDOCRINE:  He has had no heat intolerance, cold  intolerance, polyuria or polydipsia.  NEURO:  He has had no dizziness,  blackouts or seizures.  He has had somewhat of a left-sided headache.  VASCULAR:  He has had no claudication rest pain.  He has had no history  of DVT or phlebitis.  HEMATOLOGIC:  He has had no bleeding problems or  clotting disorders.   PHYSICAL EXAMINATION:  VITAL SIGNS:  Temperature is 98, blood pressure  138/88, heart rate 103.  NECK:  Supple.  There is no cervical lymphadenopathy.  He has  significant obesity.  I did not detect carotid bruits.  LUNGS:  Clear bilaterally to auscultation.  CARDIAC:  On cardiac exam, he has a regular rate and rhythm.  ABDOMEN:  Obese and difficult to assess, but I cannot palpate an  aneurysm.  PULSES/EXTREMITIES:  He has palpable femoral, popliteal,  posterior  tibial pulses bilaterally and diminished but palpable right dorsalis  pedis pulse with normal left dorsalis pedis pulse.  He has no  significant lower extremity swelling.  NEUROLOGIC:  Exam reveals 3/5 grip in the right upper extremity, 3/5  biceps in the right upper extremity, 3/5 triceps in the right upper  extremity.  He has normal strength in the left upper extremity and both  lower extremities.  He has some paresthesias in the right upper  extremity but none in the lower extremities or left arm.  Speech is  somewhat slow, but he has no significant dysarthria currently.   I did review his CT scan which shows a subtle area of hypoattenuation  within the left frontal parietal area which could be related to his  recent symptoms.  Duplex scan:  I also reviewed this and it shows a  critical greater than 80% left carotid stenosis with no significant  carotid stenosis on the right.   LABORATORY EVALUATION:  Reveals normal coags and a platelet count.  Hemoglobin  and hematocrit 14 and 44 respectively, white count 9.6,  creatinine 1.56.   IMPRESSION:  This patient presents with a symptomatic critical left  carotid stenosis.  He has had dysarthria and right upper extremity  weakness consistent with a symptomatic left carotid stenosis.  The  stenosis is quite tight on duplex, and I have recommend left carotid  endarterectomy in order to lower his risk of future stroke.  We have  discussed the indications for the procedure and the potential  complications including but not limited to bleeding, stroke (peri-  procedural risk 1-2%), MI, nerve injury, or other unpredictable medical  problems.  We discussed the potential risk of reperfusion bleed given  his recent stroke.  I have also discussed the option of delaying surgery  for 4-6 weeks to allow him to have time to heal the stroke to lower his  risk of reperfusion bleed.  I have explained there is some risk of  having a stroke in  the interim, and he would have to be on Coumadin.  However, given that his CT findings are subtle, and he has reasonable  strength in the right upper extremity, (he does not have a profound  weakness) I feel that it is safe to proceed, and the patient  and wife  both would favor early carotid endarterectomy  versus delayed carotid endarterectomy.  We will need careful control of  his blood pressure postoperatively.  We put him on the schedule for  tomorrow.  All the patient's and his wife's questions have been  answered.  He will continue his aspirin through surgery, but we will  hold his Lovenox.      Di Kindle. Edilia Bo, M.D.  Electronically Signed     CSD/MEDQ  D:  11/26/2007  T:  11/26/2007  Job:  [id number]   cc:   Internal Medicine Teaching Service

## 2010-07-06 NOTE — Procedures (Signed)
NAME:  [name], [name]           ACCOUNT NO.:  [id number]   MEDICAL RECORD NO.:  [id number]          PATIENT TYPE:  OUT   LOCATION:  SLEEP CENTER                 FACILITY:  [facility]   PHYSICIAN:  Clinton D. Maple Hudson, MD, FCCP, FACPDATE OF BIRTH:  06-11-1955   DATE OF STUDY:  06/24/2006                            NOCTURNAL POLYSOMNOGRAM   REFERRING PHYSICIAN:  Dineen Kid. Reche Dixon, M.D.   INDICATION FOR STUDY:  Hypersomnia with sleep apnea.   EPWORTH SLEEPINESS SCORE:  19/24.  BMI 45.4.  Weight 325 pounds.   MEDICATIONS:  Home medications are listed and reviewed.   A baseline study on May 05, 2006, recorded an AHI of 79.7 per hour.  CPAP titration is requested.   SLEEP ARCHITECTURE:  Total sleep time 383 minutes with sleep efficiency  95%.  Stage I 5%, stage II 57%, stages III and IV absent.  REM 38% of  total sleep time indicating REM rebound.  Sleep latency was 6 minutes.  REM latency 40 minutes.  Awake after sleep onset 15 minutes.  Arousal  index 7.  Lipitor, Naproxen, hydrochlorothiazide and metformin were  taken at 10 p.m.   RESPIRATORY DATA:  CPAP titration protocol.  CPAP was titrated to 22  CWP, AHI 0.8 per hour.  A medium, ResMed Quattro mask was used with  heated humidifier.   OXYGEN DATA:  Moderate snoring before CPAP control was prevented by CPAP  and saturation held at 92% on room air with CPAP.   CARDIAC DATA:  Sinus rhythm with occasional PAC.   MOVEMENT-PARASOMNIA:  Rare limb jerk.   IMPRESSIONS-RECOMMENDATIONS:  1. Successful continuous positive airway pressure (CPAP) titration to      22 CWP, AHI 0.8 per hour.  A medium,      ResMed Quattro mask was used with heated humidifier.  2. Diagnostic nocturnal polysomnography (NPSG) on May 05, 2006,      recorded an AHI of 79.7 per hour.      Clinton D. Maple Hudson, MD, [facility], FACP  Diplomate, Biomedical engineer of Sleep Medicine  Electronically Signed     CDY/MEDQ  D:  06/29/2006 10:09:21  T:  06/30/2006 08:04:20   Job:  [id number]

## 2010-07-06 NOTE — Procedures (Signed)
NAME:  [name], [name]           ACCOUNT NO.:  [id number]   MEDICAL RECORD NO.:  [id number]          PATIENT TYPE:  OUT   LOCATION:  SLEEP CENTER                 FACILITY:  [facility]   PHYSICIAN:  Clinton D. Maple Hudson, MD, FCCP, FACPDATE OF BIRTH:  1955-02-23   DATE OF STUDY:                            NOCTURNAL POLYSOMNOGRAM   INDICATION FOR STUDY:  Hypersomnia with sleep apnea.   EPWORTH SLEEPINESS SCORE:  18/24. BMI 45.3. Weight 324 pounds.   MEDICATIONS:  Home medications listed and reviewed. Diagnostic NPSG  protocol ordered.   SLEEP ARCHITECTURE:  Short total sleep time 254 minutes with sleep  efficiency 71%. Stage I was 32%, stage II 57%, stages III and IV absent.  REM 12% of total sleep time.  Sleep latency 41 minutes. REM latency 35 minutes. Awake after sleep  onset 57 minutes. Arousal index 35.8. Sleep was fragmented by very  frequent sleep stage changes and awakenings reflecting respiratory  events.   RESPIRATORY DATA:  Apnea/hypopnea index (AHI/RDI) 79.7 obstructive  events per hour indicating severe obstructive sleep apnea/hypopnea  syndrome. There were 299 obstructive apnea's and 39 hypopnea's. Events  were not positional. REM AHI 80.   OXYGEN DATA:  Moderately loud snoring with oxygen desaturation to a  nadir of 70%.  Mean oxygen saturation through the study was 90% on room  air.   CARDIAC DATA:  Sinus rhythm with sinus pauses at blocked PACs.   MOVEMENT-PARASOMNIA:  No significant limb jerk or unusual activity  noted.   IMPRESSIONS-RECOMMENDATIONS:  1. Severe obstructive sleep apnea/hypopnea syndrome, RDI 79.7 per hour      with non positional events. Moderately loud snoring at oxygen      desaturation to 70%.  2. A diagnostic NPSG protocol was ordered. Consider return for CPAP      titration or evaluate for alternative therapies as appropriate.      Clinton D. Maple Hudson, MD, [facility], FACP  Diplomate, Biomedical engineer of Sleep Medicine  Electronically Signed    CDY/MEDQ  D:  05/11/2006 15:23:34  T:  05/11/2006 19:08:02  Job:  [id number]

## 2010-10-06 ENCOUNTER — Emergency Department (HOSPITAL_COMMUNITY)
Admission: EM | Admit: 2010-10-06 | Discharge: 2010-10-06 | Disposition: A | Discharge status: Home / Self Care | Payer: Self-pay | Attending: Emergency Medicine | Admitting: Emergency Medicine

## 2010-10-06 DIAGNOSIS — M109 Gout, unspecified: Secondary | ICD-10-CM | POA: Insufficient documentation

## 2010-10-06 DIAGNOSIS — E119 Type 2 diabetes mellitus without complications: Secondary | ICD-10-CM | POA: Insufficient documentation

## 2010-10-06 DIAGNOSIS — M2550 Pain in unspecified joint: Secondary | ICD-10-CM | POA: Insufficient documentation

## 2010-10-06 DIAGNOSIS — Z8679 Personal history of other diseases of the circulatory system: Secondary | ICD-10-CM | POA: Insufficient documentation

## 2010-10-06 DIAGNOSIS — I10 Essential (primary) hypertension: Secondary | ICD-10-CM | POA: Insufficient documentation

## 2010-10-06 LAB — POCT I-STAT, CHEM 8
BUN: 26 mg/dL — ABNORMAL HIGH (ref 6–23)
Chloride: 108 mEq/L (ref 96–112)
Creatinine, Ser: 1.3 mg/dL (ref 0.50–1.35)
Sodium: 137 mEq/L (ref 135–145)
TCO2: 22 mmol/L (ref 0–100)

## 2010-10-06 LAB — URIC ACID: Uric Acid, Serum: 11.5 mg/dL — ABNORMAL HIGH (ref 4.0–7.8)

## 2010-10-06 LAB — SEDIMENTATION RATE: Sed Rate: 76 mm/hr — ABNORMAL HIGH (ref 0–16)

## 2010-10-07 LAB — RHEUMATOID FACTOR: Rhuematoid fact SerPl-aCnc: <10 IU/mL (ref <=14)

## 2010-10-08 LAB — ROCKY MTN SPOTTED FVR AB, IGG-BLOOD: RMSF IgG: 0.52 IV

## 2010-10-08 LAB — ROCKY MTN SPOTTED FVR AB, IGM-BLOOD: RMSF IgM: 0.37 IV (ref 0.00–0.89)

## 2010-10-30 ENCOUNTER — Inpatient Hospital Stay (INDEPENDENT_AMBULATORY_CARE_PROVIDER_SITE_OTHER)
Admission: RE | Admit: 2010-10-30 | Discharge: 2010-10-30 | Disposition: A | Discharge status: Home / Self Care | Payer: Self-pay | Source: Ambulatory Visit | Attending: Family Medicine | Admitting: Family Medicine

## 2010-10-30 DIAGNOSIS — M129 Arthropathy, unspecified: Secondary | ICD-10-CM

## 2010-10-30 LAB — POCT I-STAT, CHEM 8
Creatinine, Ser: 1.4 mg/dL — ABNORMAL HIGH (ref 0.50–1.35)
HCT: 40 % (ref 39.0–52.0)
Hemoglobin: 13.6 g/dL (ref 13.0–17.0)
Potassium: 4.8 mEq/L (ref 3.5–5.1)
Sodium: 139 mEq/L (ref 135–145)
TCO2: 22 mmol/L (ref 0–100)

## 2010-10-30 LAB — POCT URINALYSIS DIP (DEVICE)
Glucose, UA: NEGATIVE mg/dL
Leukocytes, UA: NEGATIVE
Nitrite: NEGATIVE
Protein, ur: NEGATIVE mg/dL
Specific Gravity, Urine: 1.01 (ref 1.005–1.030)
Urobilinogen, UA: 0.2 mg/dL (ref 0.0–1.0)

## 2010-10-30 LAB — SEDIMENTATION RATE: Sed Rate: 45 mm/hr — ABNORMAL HIGH (ref 0–16)

## 2010-11-20 LAB — CARDIAC PANEL(CRET KIN+CKTOT+MB+TROPI)
CK, MB: 2.1
Relative Index: INVALID
Total CK: 91
Troponin I: 0.06

## 2010-11-20 LAB — BASIC METABOLIC PANEL
BUN: 14
BUN: 16
CO2: 27
Calcium: 10
Creatinine, Ser: 1.4
Creatinine, Ser: 1.43
GFR calc Af Amer: >60
GFR calc non Af Amer: 54 — ABNORMAL LOW
Glucose, Bld: 150 — ABNORMAL HIGH

## 2010-11-20 LAB — GLUCOSE, CAPILLARY
Glucose-Capillary: 112 — ABNORMAL HIGH
Glucose-Capillary: 130 — ABNORMAL HIGH
Glucose-Capillary: 132 — ABNORMAL HIGH
Glucose-Capillary: 132 — ABNORMAL HIGH
Glucose-Capillary: 138 — ABNORMAL HIGH
Glucose-Capillary: 139 — ABNORMAL HIGH
Glucose-Capillary: 149 — ABNORMAL HIGH
Glucose-Capillary: 150 — ABNORMAL HIGH
Glucose-Capillary: 154 — ABNORMAL HIGH
Glucose-Capillary: 161 — ABNORMAL HIGH
Glucose-Capillary: 163 — ABNORMAL HIGH
Glucose-Capillary: 228 — ABNORMAL HIGH
Glucose-Capillary: 263 — ABNORMAL HIGH

## 2010-11-20 LAB — COMPREHENSIVE METABOLIC PANEL
ALT: 28
ALT: 34
AST: 25
Albumin: 3.7
BUN: 24 — ABNORMAL HIGH
Calcium: 9.4
Calcium: 9.6
Creatinine, Ser: 1.49
GFR calc Af Amer: >60
Glucose, Bld: 149 — ABNORMAL HIGH
Potassium: 4.4
Sodium: 135
Sodium: 137
Total Protein: 6.5
Total Protein: 6.7

## 2010-11-20 LAB — DIFFERENTIAL
Lymphs Abs: 1.5
Monocytes Absolute: 0.3
Monocytes Relative: 3
Neutro Abs: 7.5
Neutrophils Relative %: 78 — ABNORMAL HIGH

## 2010-11-20 LAB — CBC
HCT: 42.1
Hemoglobin: 14.7
MCHC: 33.2
MCHC: 33.2
MCHC: 33.7
MCV: 90.6
Platelets: 214
Platelets: 226
Platelets: 229
RBC: 5.02
RDW: 13.4
RDW: 13.5
RDW: 13.6
RDW: 13.9
WBC: 14.7 — ABNORMAL HIGH

## 2010-11-20 LAB — LIPID PANEL
Cholesterol: 164
HDL: 21 — ABNORMAL LOW
LDL Cholesterol: 86
LDL Cholesterol: UNDETERMINED
Total CHOL/HDL Ratio: 6.6
Triglycerides: 403 — ABNORMAL HIGH
VLDL: UNDETERMINED

## 2010-11-20 LAB — URINALYSIS, ROUTINE W REFLEX MICROSCOPIC
Glucose, UA: NEGATIVE
Hgb urine dipstick: NEGATIVE
Protein, ur: NEGATIVE
Specific Gravity, Urine: 1.016
pH: 5.5

## 2010-11-20 LAB — HOMOCYSTEINE: Homocysteine: 14.8

## 2010-11-20 LAB — APTT: aPTT: 26

## 2010-11-20 LAB — PROTIME-INR: INR: 0.9

## 2010-11-20 LAB — RAPID URINE DRUG SCREEN, HOSP PERFORMED
Barbiturates: NOT DETECTED
Benzodiazepines: NOT DETECTED

## 2010-11-20 LAB — CK TOTAL AND CKMB (NOT AT ARMC): Total CK: 84

## 2010-11-20 LAB — TROPONIN I: Troponin I: 0.05

## 2011-09-26 ENCOUNTER — Encounter: Payer: Self-pay | Admitting: Vascular Surgery

## 2014-02-18 HISTORY — PX: CARDIAC CATHETERIZATION: SHX172

## 2014-09-03 ENCOUNTER — Emergency Department (HOSPITAL_COMMUNITY): Payer: No Typology Code available for payment source

## 2014-09-03 ENCOUNTER — Encounter (HOSPITAL_COMMUNITY): Payer: Self-pay | Admitting: Physical Medicine and Rehabilitation

## 2014-09-03 ENCOUNTER — Inpatient Hospital Stay (HOSPITAL_COMMUNITY)
Admission: EM | Admit: 2014-09-03 | Discharge: 2014-09-05 | DRG: 065 | Disposition: A | Discharge status: Home / Self Care | Payer: No Typology Code available for payment source | Attending: Internal Medicine | Admitting: Internal Medicine

## 2014-09-03 DIAGNOSIS — Z8673 Personal history of transient ischemic attack (TIA), and cerebral infarction without residual deficits: Secondary | ICD-10-CM

## 2014-09-03 DIAGNOSIS — R4701 Aphasia: Secondary | ICD-10-CM

## 2014-09-03 DIAGNOSIS — G8321 Monoplegia of upper limb affecting right dominant side: Secondary | ICD-10-CM | POA: Diagnosis present

## 2014-09-03 DIAGNOSIS — E1122 Type 2 diabetes mellitus with diabetic chronic kidney disease: Secondary | ICD-10-CM | POA: Diagnosis present

## 2014-09-03 DIAGNOSIS — I6381 Other cerebral infarction due to occlusion or stenosis of small artery: Secondary | ICD-10-CM | POA: Insufficient documentation

## 2014-09-03 DIAGNOSIS — R7303 Prediabetes: Secondary | ICD-10-CM

## 2014-09-03 DIAGNOSIS — Z79899 Other long term (current) drug therapy: Secondary | ICD-10-CM

## 2014-09-03 DIAGNOSIS — E785 Hyperlipidemia, unspecified: Secondary | ICD-10-CM

## 2014-09-03 DIAGNOSIS — R471 Dysarthria and anarthria: Secondary | ICD-10-CM | POA: Insufficient documentation

## 2014-09-03 DIAGNOSIS — N189 Chronic kidney disease, unspecified: Secondary | ICD-10-CM

## 2014-09-03 DIAGNOSIS — I10 Essential (primary) hypertension: Secondary | ICD-10-CM

## 2014-09-03 DIAGNOSIS — Z833 Family history of diabetes mellitus: Secondary | ICD-10-CM

## 2014-09-03 DIAGNOSIS — Z8249 Family history of ischemic heart disease and other diseases of the circulatory system: Secondary | ICD-10-CM

## 2014-09-03 DIAGNOSIS — E1140 Type 2 diabetes mellitus with diabetic neuropathy, unspecified: Secondary | ICD-10-CM | POA: Diagnosis present

## 2014-09-03 DIAGNOSIS — N179 Acute kidney failure, unspecified: Secondary | ICD-10-CM | POA: Diagnosis present

## 2014-09-03 DIAGNOSIS — E669 Obesity, unspecified: Secondary | ICD-10-CM | POA: Diagnosis present

## 2014-09-03 DIAGNOSIS — Z823 Family history of stroke: Secondary | ICD-10-CM

## 2014-09-03 DIAGNOSIS — F17210 Nicotine dependence, cigarettes, uncomplicated: Secondary | ICD-10-CM | POA: Diagnosis present

## 2014-09-03 DIAGNOSIS — I129 Hypertensive chronic kidney disease with stage 1 through stage 4 chronic kidney disease, or unspecified chronic kidney disease: Secondary | ICD-10-CM | POA: Diagnosis not present

## 2014-09-03 DIAGNOSIS — G4733 Obstructive sleep apnea (adult) (pediatric): Secondary | ICD-10-CM

## 2014-09-03 DIAGNOSIS — Z720 Tobacco use: Secondary | ICD-10-CM

## 2014-09-03 DIAGNOSIS — I639 Cerebral infarction, unspecified: Secondary | ICD-10-CM | POA: Diagnosis not present

## 2014-09-03 DIAGNOSIS — I63532 Cerebral infarction due to unspecified occlusion or stenosis of left posterior cerebral artery: Principal | ICD-10-CM | POA: Diagnosis present

## 2014-09-03 DIAGNOSIS — Z9889 Other specified postprocedural states: Secondary | ICD-10-CM

## 2014-09-03 DIAGNOSIS — F40240 Claustrophobia: Secondary | ICD-10-CM | POA: Diagnosis present

## 2014-09-03 DIAGNOSIS — Z6833 Body mass index (BMI) 33.0-33.9, adult: Secondary | ICD-10-CM

## 2014-09-03 DIAGNOSIS — Z7982 Long term (current) use of aspirin: Secondary | ICD-10-CM

## 2014-09-03 HISTORY — DX: Hyperlipidemia, unspecified: E78.5

## 2014-09-03 HISTORY — DX: Cerebral infarction, unspecified: I63.9

## 2014-09-03 HISTORY — DX: Type 2 diabetes mellitus without complications: E11.9

## 2014-09-03 HISTORY — DX: Essential (primary) hypertension: I10

## 2014-09-03 LAB — COMPREHENSIVE METABOLIC PANEL
ALK PHOS: 90 U/L (ref 38–126)
ALT: 16 U/L — AB (ref 17–63)
ANION GAP: 11 (ref 5–15)
AST: 18 U/L (ref 15–41)
Albumin: 4.1 g/dL (ref 3.5–5.0)
BUN: 23 mg/dL — AB (ref 6–20)
CO2: 19 mmol/L — AB (ref 22–32)
Calcium: 9.3 mg/dL (ref 8.9–10.3)
Chloride: 108 mmol/L (ref 101–111)
Creatinine, Ser: 1.79 mg/dL — ABNORMAL HIGH (ref 0.61–1.24)
GFR, EST AFRICAN AMERICAN: 47 mL/min — AB (ref >60)
GFR, EST NON AFRICAN AMERICAN: 41 mL/min — AB (ref >60)
GLUCOSE: 120 mg/dL — AB (ref 65–99)
POTASSIUM: 4 mmol/L (ref 3.5–5.1)
SODIUM: 138 mmol/L (ref 135–145)
TOTAL PROTEIN: 7.3 g/dL (ref 6.5–8.1)
Total Bilirubin: 0.8 mg/dL (ref 0.3–1.2)

## 2014-09-03 LAB — DIFFERENTIAL
BASOS PCT: 0 % (ref 0–1)
Basophils Absolute: 0 10*3/uL (ref 0.0–0.1)
Eosinophils Absolute: 0.2 10*3/uL (ref 0.0–0.7)
Eosinophils Relative: 2 % (ref 0–5)
LYMPHS PCT: 15 % (ref 12–46)
Lymphs Abs: 1.4 10*3/uL (ref 0.7–4.0)
MONO ABS: 0.5 10*3/uL (ref 0.1–1.0)
MONOS PCT: 6 % (ref 3–12)
NEUTROS ABS: 7.3 10*3/uL (ref 1.7–7.7)
NEUTROS PCT: 77 % (ref 43–77)

## 2014-09-03 LAB — CBC
HCT: 50.1 % (ref 39.0–52.0)
Hemoglobin: 16.7 g/dL (ref 13.0–17.0)
MCH: 30 pg (ref 26.0–34.0)
MCHC: 33.3 g/dL (ref 30.0–36.0)
MCV: 90.1 fL (ref 78.0–100.0)
PLATELETS: 179 10*3/uL (ref 150–400)
RBC: 5.56 MIL/uL (ref 4.22–5.81)
RDW: 14.1 % (ref 11.5–15.5)
WBC: 9.5 10*3/uL (ref 4.0–10.5)

## 2014-09-03 LAB — CBG MONITORING, ED: Glucose-Capillary: 109 mg/dL — ABNORMAL HIGH (ref 65–99)

## 2014-09-03 LAB — PROTIME-INR
INR: 1.08 (ref 0.00–1.49)
Prothrombin Time: 14.2 seconds (ref 11.6–15.2)

## 2014-09-03 LAB — I-STAT TROPONIN, ED: TROPONIN I, POC: 0.01 ng/mL (ref 0.00–0.08)

## 2014-09-03 LAB — ETHANOL

## 2014-09-03 LAB — APTT: aPTT: 28 seconds (ref 24–37)

## 2014-09-03 MED ORDER — STROKE: EARLY STAGES OF RECOVERY BOOK
Freq: Once | Status: AC
  Administered 2014-09-03: 22:00:00
  Filled 2014-09-03: qty 1

## 2014-09-03 MED ORDER — ASPIRIN 325 MG PO TABS
325.0000 mg | ORAL_TABLET | Freq: Once | ORAL | Status: AC
  Administered 2014-09-03: 325 mg via ORAL
  Filled 2014-09-03: qty 1

## 2014-09-03 MED ORDER — PREGABALIN 75 MG PO CAPS: 75.0000 mg | ORAL_CAPSULE | Freq: Every evening | ORAL | Status: DC | PRN

## 2014-09-03 MED ORDER — LORAZEPAM 2 MG/ML IJ SOLN
1.0000 mg | Freq: Once | INTRAMUSCULAR | Status: AC
  Administered 2014-09-03: 1 mg via INTRAVENOUS
  Filled 2014-09-03: qty 1

## 2014-09-03 MED ORDER — SODIUM CHLORIDE 0.9 % IV SOLN
INTRAVENOUS | Status: DC
  Administered 2014-09-03: 1000 mL via INTRAVENOUS
  Administered 2014-09-04: 05:00:00 via INTRAVENOUS

## 2014-09-03 MED ORDER — HEPARIN SODIUM (PORCINE) 5000 UNIT/ML IJ SOLN
5000.0000 [IU] | Freq: Three times a day (TID) | INTRAMUSCULAR | Status: DC
  Administered 2014-09-03 – 2014-09-05 (×6): 5000 [IU] via SUBCUTANEOUS
  Filled 2014-09-03 (×6): qty 1

## 2014-09-03 MED ORDER — ASPIRIN 300 MG RE SUPP: 300.0000 mg | Freq: Once | RECTAL | Status: AC

## 2014-09-03 MED ORDER — HEPARIN SODIUM (PORCINE) 5000 UNIT/ML IJ SOLN: 5000.0000 [IU] | Freq: Three times a day (TID) | INTRAMUSCULAR | Status: DC

## 2014-09-03 NOTE — ED Notes (Signed)
CT called to make aware pt ready is ready transport

## 2014-09-03 NOTE — Progress Notes (Signed)
Pts BP running higher than parameters per MD order. MD on call notified. No new orders. Cont to monitor.

## 2014-09-03 NOTE — H&P (Signed)
Date: 09/03/2014               Patient Name:  [name] MRN: [id number]  DOB: [birth year] Age / Sex: 59 y.o., male   PCP: No Pcp Per Patient         Medical Service: Internal Medicine Teaching Service         Attending Physician: Dr. Lajean Saver, MD    First Contact: Dr. [name] Pager: [phone]  Second Contact: Dr. Jenetta Downer Pager: [phone]       After Hours (After 5p/  First Contact Pager: [phone]  weekends / holidays): Second Contact Pager: [phone]   Chief Complaint: Slurred Speech  History of Present Illness: 59 year old white male with a PMH of DM type 2, HLD, HTN, and current smoker presented to the ED with slurred speech and right hand weakness. Last night at around midnight he developed slurred speech as well as lost his balance fell into a door. He went to bed and woke up this morning with no improvement in his speech. He continued with his daily routine until his wife finally got him to come to the ED for evaluation. He has slurred speech with right sided mouth drooping as well as worsened right hand weakness from baseline.   He had a stroke back in 2009 with residual right hand weakness. At the time his Left Carotid Artery was >80% stenosed and a stent was placed. His PCP retired last year and he has not established with a new PCP and stopped taking his medications. He does report taking Lyrica occasionally for neuropathy but has not had a prescription filled in >9 months.   CT in the ED showed progression of left frontal and parietal lobe deep white matter infarcts but no acute hemorrhage. MRI showed acute lacunar infarct extending from left corona radiata through the left external capsule as well as chronic infarcts throughout the left MCA territory and right thalamic lacunar region.   Meds: No current facility-administered medications for this encounter.   Current Outpatient Prescriptions  Medication Sig Dispense Refill  . aspirin EC 81 MG tablet  Take 81 mg by mouth daily.    . pregabalin (LYRICA) 75 MG capsule Take 75 mg by mouth at bedtime as needed (for feet).      Allergies: Allergies as of 09/03/2014  . (No Known Allergies)   Past Medical History  Diagnosis Date  . CVA (cerebral vascular accident) 2009  . Hypertension   . Hyperlipemia   . Diabetes type 2, controlled     controlled by lifestyle modification  . OSA (obstructive sleep apnea)    Past Surgical History  Procedure Laterality Date  . Appendectomy    . Knee surgery     Family History  Problem Relation Age of Onset  . Stroke Father   . Hypertension Mother   . Hypertension Father   . Hypertension Sister   . Hypertension Brother   . Diabetes Mother   . Diabetes Father   . Diabetes Sister   . Diabetes Brother   . Heart failure Mother   . Heart failure Father    History   Social History  . Marital Status: Married    Spouse Name: N/A  . Number of Children: N/A  . Years of Education: N/A   Occupational History  . Not on file.   Social History Main Topics  . Smoking status: Current Every Day Smoker -- 1.00 packs/day for 30 years  Types: Cigarettes  . Smokeless tobacco: Not on file  . Alcohol Use: 0.0 oz/week    0 Standard drinks or equivalent per week     Comment: 2-3 drinks per year  . Drug Use: No  . Sexual Activity: Not on file   Other Topics Concern  . Not on file   Social History Narrative   Lives alone in [place], [place] (2006)    Review of Systems: Review of Systems  Constitutional: Negative for fever and chills.  Eyes: Negative for blurred vision and double vision.  Respiratory: Negative for cough and shortness of breath.   Cardiovascular: Negative for chest pain.  Gastrointestinal: Negative for nausea, vomiting, abdominal pain, diarrhea and constipation.  Genitourinary: Negative for dysuria.  Neurological: Positive for weakness. Negative for dizziness.   Physical Exam: Blood pressure 165/108, pulse 66, temperature 97.8 F  (36.6 C), temperature source Oral, resp. rate 16, SpO2 89 %. GENERAL- alert, co-operative, appears as stated age, not in any distress. HEENT- Atraumatic, normocephalic, pupils constricted and minimally reactive, EOMI, oral mucosa appears moist, good and intact dentition. No carotid bruit, no cervical LN enlargement, thyroid does not appear enlarged, neck supple. CARDIAC- RRR, no murmurs, rubs or gallops. RESP- Moving equal volumes of air, and clear to auscultation bilaterally, no wheezes or crackles. ABDOMEN- Soft, nontender, no guarding or rebound, no palpable masses or organomegaly, bowel sounds present. BACK- Normal curvature of the spine, No tenderness along the vertebrae, no CVA tenderness. NEURO- Right sided mouth droop, all other cranial nerves intact, strenght upper and lower extremities- 5/5, 4/5 strength right hand grip, 5/5 left hand grip, 1/5 pinky to thumb strength on right, 5/5 pinky to thumb strength on left, Sensation intact- globally, DTRs- Normal, finger to nose test normal bilat, rapid alternating movement- intact, Gait- Normal. EXTREMITIES- pulse 2+, symmetric, no pedal edema. SKIN- Warm, dry, No rash or lesion. PSYCH- Normal mood and affect, appropriate thought content and speech.  Lab results: Basic Metabolic Panel:  Recent Labs  09/03/14 1047  NA 138  K 4.0  CL 108  CO2 19*  GLUCOSE 120*  BUN 23*  CREATININE 1.79*  CALCIUM 9.3   Liver Function Tests:  Recent Labs  09/03/14 1047  AST 18  ALT 16*  ALKPHOS 90  BILITOT 0.8  PROT 7.3  ALBUMIN 4.1   CBC:  Recent Labs  09/03/14 1047  WBC 9.5  NEUTROABS 7.3  HGB 16.7  HCT 50.1  MCV 90.1  PLT 179   CBG:  Recent Labs  09/03/14 1046  GLUCAP 109*   Coagulation:  Recent Labs  09/03/14 1047  LABPROT 14.2  INR 1.08   Urine Drug Screen: Drugs of Abuse     Component Value Date/Time   LABOPIA NEGATIVE 08/01/2009 0507   LABOPIA NONE DETECTED 11/25/2007 2100   COCAINSCRNUR NEGATIVE  08/01/2009 0507   COCAINSCRNUR NONE DETECTED 11/25/2007 2100   LABBENZ * 08/01/2009 0507    POSITIVE (NOTE) Result repeated and verified. Sent for confirmatory testing   [place] DETECTED 11/25/2007 2100   AMPHETMU NEGATIVE 08/01/2009 0507   AMPHETMU NONE DETECTED 11/25/2007 2100   THCU NONE DETECTED 11/25/2007 2100   LABBARB  11/25/2007 2100    NONE DETECTED        DRUG SCREEN FOR MEDICAL PURPOSES ONLY.  IF CONFIRMATION IS NEEDED FOR ANY PURPOSE, NOTIFY LAB WITHIN 5 DAYS.    Alcohol Level:  Recent Labs  09/03/14 [place] <5   Urinalysis: No results for input(s): COLORURINE, LABSPEC, [place], [place],  HGBUR, BILIRUBINUR, KETONESUR, PROTEINUR, UROBILINOGEN, NITRITE, LEUKOCYTESUR in the last 72 hours.  Invalid input(s): APPERANCEUR  Imaging results:  Ct Head Wo Contrast  09/03/2014   CLINICAL DATA:  Possible stroke. Bilateral hand weakness. Difficulty with speech and walking.  EXAM: CT HEAD WITHOUT CONTRAST  TECHNIQUE: Contiguous axial images were obtained from the base of the skull through the vertex without intravenous contrast.  COMPARISON:  11/26/2008  FINDINGS: Left frontal lobe low-attenuation appears increased from previous exam, image 23/ series 201. New area of subcortical low attenuation within the posterior left frontal lobe, image 25/series 201. There is no evidence for acute cortical infarct. No intracranial hemorrhage or mass. The ventricular volumes and sulci appear normal. The paranasal sinuses appear clear. The mastoid air cells are also clear. The calvarium appears intact  IMPRESSION: 1. Progression of left frontal and parietal lobe deep white matter infarcts. 2. No acute intracranial hemorrhage, stroke or mass noted.   Electronically Signed   By: Kerby Moors M.D.   On: 09/03/2014 12:15   Mr Brain Wo Contrast  09/03/2014   CLINICAL DATA:  59 year old male with slurred speech since midnight which began after a fall. Initial encounter.  EXAM: MRI HEAD WITHOUT  CONTRAST  TECHNIQUE: Multiplanar, multiecho pulse sequences of the brain and surrounding structures were obtained without intravenous contrast.  COMPARISON:  Head CT without contrast 1159 hours today, and earlier.  FINDINGS: Strict a diffusion extending from the left corona radiata caudally through the external capsule along a segment of about 3 cm (series 8, image 19). Minimal to mild associated T2 and FLAIR hyperintensity. No associated hemorrhage or mass effect.  Major intracranial vascular flow voids are preserved. No contralateral right hemisphere or posterior fossa restricted diffusion.  However, there are multifocal chronic lacunar infarcts in the left hemisphere including both small cortically based lesions (left superior frontal gyrus and parietal lobe series 6, images 22 and 23), and multifocal corona radiata involvement (including at the left frontal horn which was identified on the earlier CT). There is mild hemosiderin associated with that lesion. There is also a chronic lacunar infarct of the right lateral thalamus near the posterior limb of the right internal capsule. Nearby T2 heterogeneity in the right basal ganglia may reflect small perivascular spaces.  No midline shift, mass effect, evidence of mass lesion, ventriculomegaly, extra-axial collection or acute intracranial hemorrhage. Cervicomedullary junction and pituitary are within normal limits. Brainstem and cerebellum are within normal limits.  Visible internal auditory structures appear normal. Mastoids are clear. Paranasal sinuses are clear. Orbits soft tissues appear normal. Visualized scalp soft tissues are within normal limits. Negative visualized cervical spine. Normal bone marrow signal.  IMPRESSION: 1. Acute on chronic small vessel ischemia. Acute lacunar type infarct extending from the left corona radiata through the left external capsule. No hemorrhage or mass effect. 2. Small chronic white matter and cortically based infarcts  throughout the left MCA territory. Small chronic right thalamic lacunar infarct. 3.   Electronically Signed   By: Genevie Ann M.D.   On: 09/03/2014 15:36    Other results: EKG: normal EKG, normal sinus rhythm, unchanged from previous tracings.  Assessment & Plan by Problem: Principal Problem:   CVA (cerebral infarction) Active Problems:   Diabetes mellitus   Hyperlipidemia   Hypertension   Smoker   CKD (chronic kidney disease)   OSA (obstructive sleep apnea)  59 year old man with a history of strokes, diabetes, hyperlipidemia, hypertension and current smoker presents with a CVA with slurred speech and  right hand weakness  CVA (cerebral infarction): CT negative. MRI showing an acute lacunar infarct extending from left corona radiata through the left external capsule as well as chronic infarcts throughout the left MCA territory and right thalamic lacunar region. Will place patient on stroke protocol. EKG normal sinus. Troponin negative x1.  - Passed swallow eval - Start telemetry - PT/OT eval - Neuro checks q2hr x 12 hrs, then q4hrs - ECHO ordered - Carotid US ordered - ASA 325 mg PO qdaily  AKI on CKD: Stage unknown. Was told he had kidney problems but has not had any follow up. Baseline Cr on previous  admissions is around 1.3. Cr on admission today is 1.79.  - NS 100 mL/hr - UA pending - BMP in AM   Diabetes Mellitus Type II: Currently not taking any medications, believes it to be well controlled with lifestyle modifications. Was previously on Metformin, never required insulin.  -HgA1c pending  Hyperlipidemia: Not taking any medications for HLD.  - lipid panel pending   HTN: Patient has not been on any medications for >9 months. SBP 150-180. Will allow permissive HTN in the setting of acute ischemic stroke for 24 hours. Will assess BP medication requirements after 24 hours and will need follow up outpatient.    Smoker: RN to provide smoking cessation material  DVT PPX:   Heparin 5000 units SQ q8hr  Code status: FULL CODE  Dispo: Disposition is deferred at this time, awaiting improvement of current medical problems. Anticipated discharge in approximately 1-3 day(s).   The patient does not have a current PCP (No Pcp Per Patient) and does need an [facility] hospital follow-up appointment after discharge.  The patient does have transportation limitations that hinder transportation to clinic appointments.  Signed: [name], MD 09/03/2014, 5:41 PM

## 2014-09-03 NOTE — Progress Notes (Signed)
Patient arrived at 1850 he is alert and oriented and wife is with him no complaints of pain and vitals are stable.

## 2014-09-03 NOTE — H&P (Signed)
Date: 09/03/2014               Patient Name:  [name] MRN: [id number]  DOB: 01-17-1956 Age / Sex: 59 y.o., male   PCP: No Pcp Per Patient              Medical Service: Internal Medicine Teaching Service              Attending Physician: Dr. Madilyn Fireman, MD    First Contact: Debbra Riding, MS3 Pager: [phone]  Second Contact: Dr. [name] Pager: [phone]  Third Contact Dr. Bing Neighbors Pager: [phone]       After Hours (After 5p/  First Contact Pager: [phone]  weekends / holidays): Second Contact Pager: [phone]   Chief Complaint: slurred speech  History of Present Illness: [name] is a 59 y.o. male with a past history significant for stroke (2009), carotid artery stenosis (left carotid 80% stenosis in 2009, s/p stent placement '09), CKD, OSA, HTN, HLD, and DMT2 who presents with slurred speech and worsening of previous R hand weakness. First noticed change in speech last night (7/16 12:30AM), but was not too concerned. He then noticed his balance was off when he fell trying to put a leash on his dog, but he did not hit his head. Went to sleep, and woke up this morning (7/16) with speech still slurred and noticed worsening of previous right hand weakness (from previous stroke). Endorses mild confusion and drooping of right lower face. He denies dysphagia, blurry vision, headache, numbness, tingling, syncope, seizure.   Meds:  (Not in a hospital admission) No current facility-administered medications for this encounter.   Current Outpatient Prescriptions  Medication Sig Dispense Refill  . aspirin EC 81 MG tablet Take 81 mg by mouth daily.    . pregabalin (LYRICA) 75 MG capsule Take 75 mg by mouth at bedtime as needed (for feet).      Allergies: Allergies as of 09/03/2014  . (No Known Allergies)    Past Medical History: Past Medical History  Diagnosis Date  . CVA (cerebral vascular accident) 2009  . Hypertension   . Hyperlipemia   .  Diabetes type 2, controlled     controlled by lifestyle modification  . OSA (obstructive sleep apnea)     Past Surgical History: Past Surgical History  Procedure Laterality Date  . Appendectomy    . Knee surgery      Past Family History: Family History  Problem Relation Age of Onset  . Stroke Father   . Hypertension Mother   . Hypertension Father   . Hypertension Sister   . Hypertension Brother   . Diabetes Mother   . Diabetes Father   . Diabetes Sister   . Diabetes Brother   . Heart failure Mother   . Heart failure Father     Past Social History: History   Social History  . Marital Status: Married    Spouse Name: N/A  . Number of Children: N/A  . Years of Education: N/A   Occupational History  . Not on file.   Social History Main Topics  . Smoking status: Current Every Day Smoker -- 1.00 packs/day for 30 years    Types: Cigarettes  . Smokeless tobacco: Not on file  . Alcohol Use: 0.0 oz/week    0 Standard drinks or equivalent per week     Comment: 2-3 drinks per year  . Drug Use: No  . Sexual Activity: Not on file   Other  Topics Concern  . Not on file   Social History Narrative   Lives alone in [place], [place] (2006)    Review of Systems: Review of Systems  Constitutional: Positive for weight loss.  Eyes: Negative for blurred vision and double vision.  Respiratory: Negative for cough and shortness of breath.   Gastrointestinal: Negative for nausea, vomiting, abdominal pain, diarrhea and constipation.  Genitourinary: Negative for dysuria.  Neurological: Positive for speech change, focal weakness and weakness. Negative for dizziness, tingling, tremors, sensory change, seizures, loss of consciousness and headaches.    Physical Exam: Blood pressure 167/83, pulse 62, temperature 97.8 F (36.6 C), temperature source Oral, resp. rate 19, SpO2 99 %. General: alert, cooperative, in no acute distress CV: RRR, normal S1 and S2, no murmurs, no LE edema, +2 pulse  DP bilaterally Lung: LCAB, no increased work of breathing, no wheezing or crackles Abd: normal bowel sounds, soft, nontender Neuro: pupils equal, constricted, mildly reactive to light; EOM normal; normal facial sensation, normal eyebrow raise, normal eyelid close, mild drooping of R side of mouth with frown and smile; symmetrical palate elevation, normal lateral head rotation bilaterally, normal shoulder shrug bilaterally, symmetrical tongue protrusion; 5/5 strength bilaterally in UE and LE EXCEPT 4/5 hand grip in right hand and 1/5 pinky-to-thumb strength in right hand; 2+ reflexes in biceps, triceps, patellar, achilles; normal finger to nose; normal rapid alternating hand movements; unable to perform rapid alternating finger movements in right hand, but normal in left hand  Lab results: Results for orders placed or performed during the hospital encounter of 09/03/14 (from the past 24 hour(s))  CBG monitoring, ED     Status: Abnormal   Collection Time: 09/03/14 10:46 AM  Result Value Ref Range   Glucose-Capillary 109 (H) 65 - 99 mg/dL  Protime-INR     Status: None   Collection Time: 09/03/14 10:47 AM  Result Value Ref Range   Prothrombin Time 14.2 11.6 - 15.2 seconds   INR 1.08 0.00 - 1.49  APTT     Status: None   Collection Time: 09/03/14 10:47 AM  Result Value Ref Range   aPTT 28 24 - 37 seconds  CBC     Status: None   Collection Time: 09/03/14 10:47 AM  Result Value Ref Range   WBC 9.5 4.0 - 10.5 K/uL   RBC 5.56 4.22 - 5.81 MIL/uL   Hemoglobin 16.7 13.0 - 17.0 g/dL   HCT 50.1 39.0 - 52.0 %   MCV 90.1 78.0 - 100.0 fL   MCH 30.0 26.0 - 34.0 pg   MCHC 33.3 30.0 - 36.0 g/dL   RDW 14.1 11.5 - 15.5 %   Platelets 179 150 - 400 K/uL  Differential     Status: None   Collection Time: 09/03/14 10:47 AM  Result Value Ref Range   Neutrophils Relative % 77 43 - 77 %   Neutro Abs 7.3 1.7 - 7.7 K/uL   Lymphocytes Relative 15 12 - 46 %   Lymphs Abs 1.4 0.7 - 4.0 K/uL   Monocytes Relative 6  3 - 12 %   Monocytes Absolute 0.5 0.1 - 1.0 K/uL   Eosinophils Relative 2 0 - 5 %   Eosinophils Absolute 0.2 0.0 - 0.7 K/uL   Basophils Relative 0 0 - 1 %   Basophils Absolute 0.0 0.0 - 0.1 K/uL  Comprehensive metabolic panel     Status: Abnormal   Collection Time: 09/03/14 10:47 AM  Result Value Ref Range   Sodium 138 135 -  145 mmol/L   Potassium 4.0 3.5 - 5.1 mmol/L   Chloride 108 101 - 111 mmol/L   CO2 19 (L) 22 - 32 mmol/L   Glucose, Bld 120 (H) 65 - 99 mg/dL   BUN 23 (H) 6 - 20 mg/dL   Creatinine, Ser 1.79 (H) 0.61 - 1.24 mg/dL   Calcium 9.3 8.9 - 10.3 mg/dL   Total Protein 7.3 6.5 - 8.1 g/dL   Albumin 4.1 3.5 - 5.0 g/dL   AST 18 15 - 41 U/L   ALT 16 (L) 17 - 63 U/L   Alkaline Phosphatase 90 38 - 126 U/L   Total Bilirubin 0.8 0.3 - 1.2 mg/dL   GFR calc non Af Amer 41 (L) >60 mL/min   GFR calc Af Amer 47 (L) >60 mL/min   Anion gap 11 5 - 15  Ethanol     Status: None   Collection Time: 09/03/14 10:47 AM  Result Value Ref Range   Alcohol, Ethyl (B) <5 <5 mg/dL  I-stat troponin, ED (not at [facility])     Status: None   Collection Time: 09/03/14 10:51 AM  Result Value Ref Range   Troponin i, poc 0.01 0.00 - 0.08 ng/mL   Comment 3            Imaging results:  Ct Head Wo Contrast  09/03/2014   CLINICAL DATA:  Possible stroke. Bilateral hand weakness. Difficulty with speech and walking.  EXAM: CT HEAD WITHOUT CONTRAST  TECHNIQUE: Contiguous axial images were obtained from the base of the skull through the vertex without intravenous contrast.  COMPARISON:  11/26/2008  FINDINGS: Left frontal lobe low-attenuation appears increased from previous exam, image 23/ series 201. New area of subcortical low attenuation within the posterior left frontal lobe, image 25/series 201. There is no evidence for acute cortical infarct. No intracranial hemorrhage or mass. The ventricular volumes and sulci appear normal. The paranasal sinuses appear clear. The mastoid air cells are also clear. The  calvarium appears intact  IMPRESSION: 1. Progression of left frontal and parietal lobe deep white matter infarcts. 2. No acute intracranial hemorrhage, stroke or mass noted.   Electronically Signed   By: Kerby Moors M.D.   On: 09/03/2014 12:15   Mr Brain Wo Contrast  09/03/2014   CLINICAL DATA:  59 year old male with slurred speech since midnight which began after a fall. Initial encounter.  EXAM: MRI HEAD WITHOUT CONTRAST  TECHNIQUE: Multiplanar, multiecho pulse sequences of the brain and surrounding structures were obtained without intravenous contrast.  COMPARISON:  Head CT without contrast 1159 hours today, and earlier.  FINDINGS: Strict a diffusion extending from the left corona radiata caudally through the external capsule along a segment of about 3 cm (series 8, image 19). Minimal to mild associated T2 and FLAIR hyperintensity. No associated hemorrhage or mass effect.  Major intracranial vascular flow voids are preserved. No contralateral right hemisphere or posterior fossa restricted diffusion.  However, there are multifocal chronic lacunar infarcts in the left hemisphere including both small cortically based lesions (left superior frontal gyrus and parietal lobe series 6, images 22 and 23), and multifocal corona radiata involvement (including at the left frontal horn which was identified on the earlier CT). There is mild hemosiderin associated with that lesion. There is also a chronic lacunar infarct of the right lateral thalamus near the posterior limb of the right internal capsule. Nearby T2 heterogeneity in the right basal ganglia may reflect small perivascular spaces.  No midline shift, mass effect,  evidence of mass lesion, ventriculomegaly, extra-axial collection or acute intracranial hemorrhage. Cervicomedullary junction and pituitary are within normal limits. Brainstem and cerebellum are within normal limits.  Visible internal auditory structures appear normal. Mastoids are clear. Paranasal  sinuses are clear. Orbits soft tissues appear normal. Visualized scalp soft tissues are within normal limits. Negative visualized cervical spine. Normal bone marrow signal.  IMPRESSION: 1. Acute on chronic small vessel ischemia. Acute lacunar type infarct extending from the left corona radiata through the left external capsule. No hemorrhage or mass effect. 2. Small chronic white matter and cortically based infarcts throughout the left MCA territory. Small chronic right thalamic lacunar infarct. 3.   Electronically Signed   By: Genevie Ann M.D.   On: 09/03/2014 15:36    Other results: EKG: normal sinus rhythm. Left atrial enlargement  Assessment & Plan by Problem: Principal Problem:   CVA (cerebral infarction) Active Problems:   Diabetes mellitus   Hyperlipidemia   Hypertension   Smoker   CKD (chronic kidney disease)   OSA (obstructive sleep apnea)   [name] is a 59 y.o. male with a past history significant for stroke (2009), carotid artery stenosis (left carotid 80% stenosis in 2009, s/p stent placement '09), CKD, OSA, HTN, HLD, and DMT2 who presented with slurred speech and worsening of previous R hand weakness, due to an acute left lacunar stroke.  Acute Left Lacunar Stroke: CT negative. MRI showed an acute left lacunar stroke extending from L corona radiata through L external capsule, as well as multiple chronic infarcts. Presented too late to give tPA. EKG normal, troponin neg. Will assess for chronic conditions (CAS, HTN, CKD, HLD, DMT2) for secondary stroke prevention. Probe for smoking cessation education. - aspirin 325 mg daily - Echo tomorrow - on telemetry -neuro check q2hr x12hr, then q4hr  Carotid Artery Stenosis: s/p stent placement in 2009 when L carotid at 80% stenosis. May be possible etiology for current stroke, will need to reassess. - US Carotid Arteries  HTN: Blood pressure elevated  Systolic BP [id number]'R. He does not take any home medications for his blood  pressure. Will not treat for now due to acute stroke, but will continue to monitor. Will need follow up with PCP to treat chronic HTN.  CKD:  Has not been taking medications for chronic CKD. Today's creatinine elevated to 1.79 (baseline 1.4) and GFR 41 (unknown baseline). Will continue to monitor kidney function to check for AKI. - NS IV 100 mL/hr - UA tomorrow -BMP tomorrow  HLD: Has not been taking any medications for HLD. - lipid panel tomorrow  DMT2: Currently treated with lifestyle modifications. Previously on Metformin. Glucose in ED at 120. - Hgb A1c tomorrow  Dispo: Depending on results of tests, estimated discharge in 1-2 days.  -Needs to establish PCP  FEN: heart healthy diet DVT Prophylaxis: heparin Code Status: Full  This is a Careers information officer Note.  The care of the patient was discussed with Dr. Charlynn Grimes and Dr. Denton Brick and the assessment and plan was formulated with their assistance.  Please see their note for official documentation of the patient encounter.   Signed: Debbra Riding, Med Student 09/03/2014, 5:45 PM

## 2014-09-03 NOTE — ED Notes (Signed)
Pt presents to department for evaluation of possible CVA. Pt states he began having bilateral hand weakness Friday evening, wife reports garbled speech and difficulty walking this morning. Pt's speech slurred upon arrival to ED. Pt is alert, able to move all extremities. Denies pain.

## 2014-09-03 NOTE — ED Provider Notes (Signed)
CSN: [id number]     Arrival date & time 09/03/14  1035 History   First MD Initiated Contact with Patient 09/03/14 1045     Chief Complaint  Patient presents with  . Cerebrovascular Accident     (Consider location/radiation/quality/duration/timing/severity/associated sxs/prior Treatment) Patient is a 59 y.o. male presenting with Acute Neurological Problem. The history is provided by the patient and the spouse. The history is limited by the condition of the patient.  Cerebrovascular Accident Pertinent negatives include no chest pain, no abdominal pain, no headaches and no shortness of breath.  Patient w hx prior cva, presents w slurred speech since last pm. Started around [place].  Pt had leaned against, falling into wall, ?imbalance - no head trauma or fall to ground, along w slurred speech. Slurred speech persists this AM.  From prior cva, minor residual right hand weakness, but could attend to normal adls, and speech normal.  Pt denies headache. No trouble breathing or swallowing. Denies extremity numbness/weakness. Spouse indicates balance seems impaired this AM.  Denies fever or chills.      Past Medical History  Diagnosis Date  . CVA (cerebral vascular accident)   . Hypertension   . Hyperlipemia    History reviewed. No pertinent past surgical history. No family history on file. History  Substance Use Topics  . Smoking status: Current Every Day Smoker    Types: Cigarettes  . Smokeless tobacco: Not on file  . Alcohol Use: No    Review of Systems  Constitutional: Negative for fever and chills.  HENT: Negative for sore throat.   Eyes: Negative for redness.  Respiratory: Negative for shortness of breath.   Cardiovascular: Negative for chest pain.  Gastrointestinal: Negative for vomiting and abdominal pain.  Genitourinary: Negative for flank pain.  Musculoskeletal: Negative for back pain and neck pain.  Skin: Negative for rash.  Neurological: Positive for speech difficulty.  Negative for weakness, numbness and headaches.  Hematological: Does not bruise/bleed easily.  Psychiatric/Behavioral: Negative for confusion.      Allergies  Review of patient's allergies indicates no known allergies.  Home Medications   Prior to Admission medications   Not on File   BP 172/83 mmHg  Pulse 75  Temp(Src) 97.8 F (36.6 C) (Oral)  Resp 18  SpO2 99% Physical Exam  Constitutional: He is oriented to person, place, and time. He appears well-developed and well-nourished. No distress.  HENT:  Head: Atraumatic.  Mouth/Throat: Oropharynx is clear and moist.  Eyes: Conjunctivae are normal. Pupils are equal, round, and reactive to light. No scleral icterus.  Neck: Neck supple. No tracheal deviation present.  No bruit  Cardiovascular: Normal rate, regular rhythm, normal heart sounds and intact distal pulses.   Pulmonary/Chest: Effort normal and breath sounds normal. No accessory muscle usage. No respiratory distress.  Abdominal: Soft. Bowel sounds are normal. He exhibits no distension. There is no tenderness.  Musculoskeletal: Normal range of motion. He exhibits no edema or tenderness.  Neurological: He is alert and oriented to person, place, and time. No cranial nerve deficit.  Speech markedly dysarthric. No pronator drift, motor intact bil, stre 5/5. sens grossly intact  Skin: Skin is warm and dry. He is not diaphoretic.  Psychiatric: He has a normal mood and affect.  Nursing note and vitals reviewed.   ED Course  Procedures (including critical care time) Labs Review   Results for orders placed or performed during the hospital encounter of 09/03/14  Protime-INR  Result Value Ref Range   Prothrombin Time 14.2  11.6 - 15.2 seconds   INR 1.08 0.00 - 1.49  APTT  Result Value Ref Range   aPTT 28 24 - 37 seconds  CBC  Result Value Ref Range   WBC 9.5 4.0 - 10.5 K/uL   RBC 5.56 4.22 - 5.81 MIL/uL   Hemoglobin 16.7 13.0 - 17.0 g/dL   HCT 50.1 39.0 - 52.0 %   MCV  90.1 78.0 - 100.0 fL   MCH 30.0 26.0 - 34.0 pg   MCHC 33.3 30.0 - 36.0 g/dL   RDW 14.1 11.5 - 15.5 %   Platelets 179 150 - 400 K/uL  Differential  Result Value Ref Range   Neutrophils Relative % 77 43 - 77 %   Neutro Abs 7.3 1.7 - 7.7 K/uL   Lymphocytes Relative 15 12 - 46 %   Lymphs Abs 1.4 0.7 - 4.0 K/uL   Monocytes Relative 6 3 - 12 %   Monocytes Absolute 0.5 0.1 - 1.0 K/uL   Eosinophils Relative 2 0 - 5 %   Eosinophils Absolute 0.2 0.0 - 0.7 K/uL   Basophils Relative 0 0 - 1 %   Basophils Absolute 0.0 0.0 - 0.1 K/uL  Comprehensive metabolic panel  Result Value Ref Range   Sodium 138 135 - 145 mmol/L   Potassium 4.0 3.5 - 5.1 mmol/L   Chloride 108 101 - 111 mmol/L   CO2 19 (L) 22 - 32 mmol/L   Glucose, Bld 120 (H) 65 - 99 mg/dL   BUN 23 (H) 6 - 20 mg/dL   Creatinine, Ser 1.79 (H) 0.61 - 1.24 mg/dL   Calcium 9.3 8.9 - 10.3 mg/dL   Total Protein 7.3 6.5 - 8.1 g/dL   Albumin 4.1 3.5 - 5.0 g/dL   AST 18 15 - 41 U/L   ALT 16 (L) 17 - 63 U/L   Alkaline Phosphatase 90 38 - 126 U/L   Total Bilirubin 0.8 0.3 - 1.2 mg/dL   GFR calc non Af Amer 41 (L) >60 mL/min   GFR calc Af Amer 47 (L) >60 mL/min   Anion gap 11 5 - 15  Ethanol  Result Value Ref Range   Alcohol, Ethyl (B) <5 <5 mg/dL  I-stat troponin, ED (not at [facility])  Result Value Ref Range   Troponin i, poc 0.01 0.00 - 0.08 ng/mL   Comment 3          CBG monitoring, ED  Result Value Ref Range   Glucose-Capillary 109 (H) 65 - 99 mg/dL   Ct Head Wo Contrast  09/03/2014   CLINICAL DATA:  Possible stroke. Bilateral hand weakness. Difficulty with speech and walking.  EXAM: CT HEAD WITHOUT CONTRAST  TECHNIQUE: Contiguous axial images were obtained from the base of the skull through the vertex without intravenous contrast.  COMPARISON:  11/26/2008  FINDINGS: Left frontal lobe low-attenuation appears increased from previous exam, image 23/ series 201. New area of subcortical low attenuation within the posterior left frontal  lobe, image 25/series 201. There is no evidence for acute cortical infarct. No intracranial hemorrhage or mass. The ventricular volumes and sulci appear normal. The paranasal sinuses appear clear. The mastoid air cells are also clear. The calvarium appears intact  IMPRESSION: 1. Progression of left frontal and parietal lobe deep white matter infarcts. 2. No acute intracranial hemorrhage, stroke or mass noted.   Electronically Signed   By: Kerby Moors M.D.   On: 09/03/2014 12:15       EKG Interpretation  Date/Time:  Saturday September 03 2014 10:45:16 EDT Ventricular Rate:  76 PR Interval:  191 QRS Duration: 82 QT Interval:  398 QTC Calculation: 447 R Axis:   19 Text Interpretation:  Sinus rhythm Left atrial enlargement Baseline wander  in lead(s) III V1 Confirmed by Ashok Cordia  MD, Lennette Bihari ([id number]) on 09/03/2014  10:48:15 AM      MDM   Iv ns. Continuous pulse ox and monitor. o2 [place]. Labs. Stat ct.  Reviewed nursing notes and prior charts for additional history.   Recheck, no change to neuro exam from prior.   Mri added to workup.  Pt indicates has no pcp. Int med roc called as next up for unassigned admission.     Lajean Saver, MD 09/03/14 [phone]

## 2014-09-04 ENCOUNTER — Inpatient Hospital Stay (HOSPITAL_COMMUNITY): Payer: No Typology Code available for payment source

## 2014-09-04 ENCOUNTER — Observation Stay (HOSPITAL_COMMUNITY): Payer: No Typology Code available for payment source

## 2014-09-04 DIAGNOSIS — G8321 Monoplegia of upper limb affecting right dominant side: Secondary | ICD-10-CM | POA: Diagnosis present

## 2014-09-04 DIAGNOSIS — F40240 Claustrophobia: Secondary | ICD-10-CM | POA: Diagnosis present

## 2014-09-04 DIAGNOSIS — I6789 Other cerebrovascular disease: Secondary | ICD-10-CM | POA: Diagnosis not present

## 2014-09-04 DIAGNOSIS — Z8249 Family history of ischemic heart disease and other diseases of the circulatory system: Secondary | ICD-10-CM | POA: Diagnosis not present

## 2014-09-04 DIAGNOSIS — Z79899 Other long term (current) drug therapy: Secondary | ICD-10-CM | POA: Diagnosis not present

## 2014-09-04 DIAGNOSIS — Z9889 Other specified postprocedural states: Secondary | ICD-10-CM | POA: Diagnosis not present

## 2014-09-04 DIAGNOSIS — Z8673 Personal history of transient ischemic attack (TIA), and cerebral infarction without residual deficits: Secondary | ICD-10-CM | POA: Diagnosis not present

## 2014-09-04 DIAGNOSIS — N179 Acute kidney failure, unspecified: Secondary | ICD-10-CM | POA: Diagnosis present

## 2014-09-04 DIAGNOSIS — E1140 Type 2 diabetes mellitus with diabetic neuropathy, unspecified: Secondary | ICD-10-CM | POA: Diagnosis present

## 2014-09-04 DIAGNOSIS — I129 Hypertensive chronic kidney disease with stage 1 through stage 4 chronic kidney disease, or unspecified chronic kidney disease: Secondary | ICD-10-CM | POA: Diagnosis present

## 2014-09-04 DIAGNOSIS — I63312 Cerebral infarction due to thrombosis of left middle cerebral artery: Secondary | ICD-10-CM | POA: Diagnosis not present

## 2014-09-04 DIAGNOSIS — F17210 Nicotine dependence, cigarettes, uncomplicated: Secondary | ICD-10-CM | POA: Diagnosis present

## 2014-09-04 DIAGNOSIS — E785 Hyperlipidemia, unspecified: Secondary | ICD-10-CM | POA: Diagnosis present

## 2014-09-04 DIAGNOSIS — I639 Cerebral infarction, unspecified: Secondary | ICD-10-CM | POA: Diagnosis not present

## 2014-09-04 DIAGNOSIS — Z833 Family history of diabetes mellitus: Secondary | ICD-10-CM | POA: Diagnosis not present

## 2014-09-04 DIAGNOSIS — R471 Dysarthria and anarthria: Secondary | ICD-10-CM | POA: Insufficient documentation

## 2014-09-04 DIAGNOSIS — Z823 Family history of stroke: Secondary | ICD-10-CM | POA: Diagnosis not present

## 2014-09-04 DIAGNOSIS — E1122 Type 2 diabetes mellitus with diabetic chronic kidney disease: Secondary | ICD-10-CM | POA: Diagnosis present

## 2014-09-04 DIAGNOSIS — N189 Chronic kidney disease, unspecified: Secondary | ICD-10-CM | POA: Diagnosis not present

## 2014-09-04 DIAGNOSIS — E669 Obesity, unspecified: Secondary | ICD-10-CM | POA: Diagnosis present

## 2014-09-04 DIAGNOSIS — Z6833 Body mass index (BMI) 33.0-33.9, adult: Secondary | ICD-10-CM | POA: Diagnosis not present

## 2014-09-04 DIAGNOSIS — I63532 Cerebral infarction due to unspecified occlusion or stenosis of left posterior cerebral artery: Secondary | ICD-10-CM | POA: Diagnosis present

## 2014-09-04 DIAGNOSIS — G4733 Obstructive sleep apnea (adult) (pediatric): Secondary | ICD-10-CM | POA: Diagnosis present

## 2014-09-04 DIAGNOSIS — Z7982 Long term (current) use of aspirin: Secondary | ICD-10-CM | POA: Diagnosis not present

## 2014-09-04 LAB — URINALYSIS, ROUTINE W REFLEX MICROSCOPIC
Bilirubin Urine: NEGATIVE
Glucose, UA: NEGATIVE mg/dL
HGB URINE DIPSTICK: NEGATIVE
Ketones, ur: NEGATIVE mg/dL
LEUKOCYTES UA: NEGATIVE
Nitrite: NEGATIVE
PROTEIN: NEGATIVE mg/dL
SPECIFIC GRAVITY, URINE: 1.012 (ref 1.005–1.030)
Urobilinogen, UA: 1 mg/dL (ref 0.0–1.0)
pH: 5.5 (ref 5.0–8.0)

## 2014-09-04 LAB — BASIC METABOLIC PANEL
Anion gap: 6 (ref 5–15)
BUN: 20 mg/dL (ref 6–20)
CHLORIDE: 108 mmol/L (ref 101–111)
CO2: 23 mmol/L (ref 22–32)
Calcium: 8.9 mg/dL (ref 8.9–10.3)
Creatinine, Ser: 1.63 mg/dL — ABNORMAL HIGH (ref 0.61–1.24)
GFR calc Af Amer: 53 mL/min — ABNORMAL LOW (ref >60)
GFR calc non Af Amer: 46 mL/min — ABNORMAL LOW (ref >60)
Glucose, Bld: 93 mg/dL (ref 65–99)
Potassium: 4.5 mmol/L (ref 3.5–5.1)
Sodium: 137 mmol/L (ref 135–145)

## 2014-09-04 LAB — LIPID PANEL
CHOL/HDL RATIO: 6.5 ratio
Cholesterol: 203 mg/dL — ABNORMAL HIGH (ref 0–200)
HDL: 31 mg/dL — ABNORMAL LOW (ref >40)
LDL Cholesterol: 151 mg/dL — ABNORMAL HIGH (ref 0–99)
Triglycerides: 106 mg/dL (ref <150)
VLDL: 21 mg/dL (ref 0–40)

## 2014-09-04 MED ORDER — ASPIRIN 81 MG PO CHEW
81.0000 mg | CHEWABLE_TABLET | Freq: Every day | ORAL | Status: DC
  Administered 2014-09-04 – 2014-09-05 (×2): 81 mg via ORAL
  Filled 2014-09-04 (×2): qty 1

## 2014-09-04 MED ORDER — ATORVASTATIN CALCIUM 40 MG PO TABS
40.0000 mg | ORAL_TABLET | Freq: Every day | ORAL | Status: DC
  Administered 2014-09-04: 40 mg via ORAL
  Filled 2014-09-04: qty 1

## 2014-09-04 NOTE — Consult Note (Signed)
Referring Physician: Dr Ellwood Dense    Chief Complaint:  stroke  HPI: [name] is a 59 y.o. male  with history of a previous CVA in 2009, hypertension, hyperlipidemia, previous carotid artery stenting, diabetes mellitus, and obstructive sleep apnea, who has not had regular medical follow-up for several years and apparently stopped taking all of his medications several months ago. Late Friday evening early Saturday morning the patient developed dysarthria. He did not seek medical attention at that time and went about his normal activities. He came to the emergency department today where an MRI showed an acute lacunar type infarct extending from the left corona radiata through the left external capsule. The patient was admitted for further evaluation and treatment. A neurology consult was requested. The patient and his family feel that his dysarthria has improved somewhat since admission. He denies any unilateral weakness. He states he is determined to quit smoking and will be compliant with medical therapy from here on out.  Date last known well: Date: 09/02/2014 Time last known well: Unable to determine tPA Given: No: Late presentation and minimal deficits.  Past Medical History  Diagnosis Date  . CVA (cerebral vascular accident) 2009  . Hypertension   . Hyperlipemia   . Diabetes type 2, controlled     controlled by lifestyle modification  . OSA (obstructive sleep apnea)     Past Surgical History  Procedure Laterality Date  . Appendectomy    . Knee surgery      Family History  Problem Relation Age of Onset  . Stroke Father   . Hypertension Mother   . Hypertension Father   . Hypertension Sister   . Hypertension Brother   . Diabetes Mother   . Diabetes Father   . Diabetes Sister   . Diabetes Brother   . Heart failure Mother   . Heart failure Father    Social History:  reports that he has been smoking cigarettes but he intends to quit.  He has a 30 pack-year smoking  history. He does not have any smokeless tobacco history on file. He reports that he drinks alcohol. He reports that he does not use illicit drugs.  Allergies: No Known Allergies  Medications:  Scheduled: . aspirin  81 mg Oral Daily  . atorvastatin  40 mg Oral q1800  . heparin  5,000 Units Subcutaneous 3 times per day    ROS: History obtained from the patient and wife  General ROS: negative for - chills, fatigue, fever, night sweats, weight gain. Intentional 100 pound weight loss over the past year. Psychological ROS: negative for - behavioral disorder, hallucinations, memory difficulties, mood swings or suicidal ideation Ophthalmic ROS: negative for - blurry vision, double vision, eye pain or loss of vision ENT ROS: negative for - epistaxis, nasal discharge, oral lesions, sore throat, tinnitus or vertigo Allergy and Immunology ROS: negative for - hives or itchy/watery eyes Hematological and Lymphatic ROS: negative for - bleeding problems, bruising or swollen lymph nodes Endocrine ROS: negative for - galactorrhea, hair pattern changes, polydipsia/polyuria or temperature intolerance Respiratory ROS: negative for - cough, hemoptysis, shortness of breath or wheezing Cardiovascular ROS: negative for - chest pain, dyspnea on exertion, edema or irregular heartbeat Gastrointestinal ROS: negative for - abdominal pain, diarrhea, hematemesis, nausea/vomiting or stool incontinence Genito-Urinary ROS: negative for - dysuria, hematuria, incontinence or urinary frequency/urgency Musculoskeletal ROS: negative for - joint swelling or muscular weakness. Positive for recent neck soreness, possible pulled muscle. Neurological ROS: as noted in HPI. Positive for severe  peripheral neuropathy secondary to his diabetes. Dermatological ROS: negative for rash and skin lesion changes   Physical Examination: Blood pressure 138/82, pulse 70, temperature 98.8 F (37.1 C), temperature source Oral, resp. rate 18,  weight 107.865 kg (237 lb 12.8 oz), SpO2 99 %.  General - 59 year old male in bed in no acute distress. Heart - Regular rate and rhythm - no murmer Lungs - decreased breath sounds with occasional wheeze. Abdomen - Soft - non tender Extremities - distal pulses weak to absent in the lower extremities - no edema Skin - Warm and dry  Mental Status: Alert, oriented, thought content appropriate.  No aphasia present significant dysarthria.  Able to follow 3 step commands without difficulty. Cranial Nerves: II: Discs not visualized; Visual fields grossly normal, pupils equal, round, reactive to light and accommodation III,IV, VI: ptosis not present, extra-ocular motions intact bilaterally V,VII: smile symmetric, facial light touch sensation normal bilaterally VIII: hearing normal bilaterally IX,X: gag reflex present XI: bilateral shoulder shrug XII: midline tongue extension Motor: Right : Upper extremity   5/5    Left:     Upper extremity   5/5  Lower extremity   5/5     Lower extremity   5/5 Tone and bulk:normal tone throughout; no atrophy noted Mild difficulty with rapid alternating movements of the right upper extremity. Sensory: Pinprick and light touch intact throughout, bilaterally Deep Tendon Reflexes: 1+ and symmetric throughout Plantars: Patient declined testing secondary to his neuropathy Cerebellar: normal finger-to-nose, normal rapid alternating movements and normal heel-to-shin test Gait: Deferred CV: pulses palpable throughout    Laboratory Studies:  Basic Metabolic Panel:  Recent Labs Lab 09/03/14 1047 09/04/14 0405  NA 138 137  K 4.0 4.5  CL 108 108  CO2 19* 23  GLUCOSE 120* 93  BUN 23* 20  CREATININE 1.79* 1.63*  CALCIUM 9.3 8.9    Liver Function Tests:  Recent Labs Lab 09/03/14 1047  AST 18  ALT 16*  ALKPHOS 90  BILITOT 0.8  PROT 7.3  ALBUMIN 4.1   No results for input(s): LIPASE, AMYLASE in the last 168 hours. No results for input(s): AMMONIA  in the last 168 hours.  CBC:  Recent Labs Lab 09/03/14 1047  WBC 9.5  NEUTROABS 7.3  HGB 16.7  HCT 50.1  MCV 90.1  PLT 179    Cardiac Enzymes: No results for input(s): CKTOTAL, CKMB, CKMBINDEX, TROPONINI in the last 168 hours.  BNP: Invalid input(s): POCBNP  CBG:  Recent Labs Lab 09/03/14 1046  GLUCAP 109*    Microbiology: Results for orders placed or performed during the hospital encounter of 07/31/09  MRSA PCR Screening     Status: None   Collection Time: 08/01/09  4:37 PM  Result Value Ref Range Status   MRSA by PCR  NEGATIVE Final    NEGATIVE        The GeneXpert MRSA Assay (FDA approved for NASAL specimens only), is one component of a comprehensive MRSA colonization surveillance program. It is not intended to diagnose MRSA infection nor to guide or monitor treatment for MRSA infections.    Coagulation Studies:  Recent Labs  09/03/14 1047  LABPROT 14.2  INR 1.08    Urinalysis:  Recent Labs Lab 09/04/14 0755  COLORURINE YELLOW  LABSPEC 1.012  PHURINE 5.5  GLUCOSEU NEGATIVE  HGBUR NEGATIVE  BILIRUBINUR NEGATIVE  KETONESUR NEGATIVE  PROTEINUR NEGATIVE  UROBILINOGEN 1.0  NITRITE NEGATIVE  LEUKOCYTESUR NEGATIVE    Lipid Panel:    Component Value Date/Time  CHOL 203* 09/04/2014 0405   TRIG 106 09/04/2014 0405   HDL 31* 09/04/2014 0405   CHOLHDL 6.5 09/04/2014 0405   VLDL 21 09/04/2014 0405   LDLCALC 151* 09/04/2014 0405    HgbA1C:  Lab Results  Component Value Date   HGBA1C 5.8* 02/06/2010    Urine Drug Screen:     Component Value Date/Time   LABOPIA NEGATIVE 08/01/2009 0507   LABOPIA NONE DETECTED 11/25/2007 2100   COCAINSCRNUR NEGATIVE 08/01/2009 0507   COCAINSCRNUR NONE DETECTED 11/25/2007 2100   LABBENZ * 08/01/2009 0507    POSITIVE (NOTE) Result repeated and verified. Sent for confirmatory testing   [place] DETECTED 11/25/2007 2100   AMPHETMU NEGATIVE 08/01/2009 0507   AMPHETMU NONE DETECTED 11/25/2007 2100    THCU NONE DETECTED 11/25/2007 2100   LABBARB  11/25/2007 2100    NONE DETECTED        DRUG SCREEN FOR MEDICAL PURPOSES ONLY.  IF CONFIRMATION IS NEEDED FOR ANY PURPOSE, NOTIFY LAB WITHIN 5 DAYS.    Alcohol Level:  Recent Labs Lab 09/03/14 [place] <5    Other results: EKG: Sinus rhythm rate 76 bpm. Please see formal cardiology reading for complete details.  Imaging:   Ct Head Wo Contrast 09/03/2014    1. Progression of left frontal and parietal lobe deep white matter infarcts.  2. No acute intracranial hemorrhage, stroke or mass noted.      Mr Brain Wo Contrast 09/03/2014    1. Acute lacunar type infarct extending from the left corona radiata through the left external capsule. Acute on chronic small vessel ischemia. No hemorrhage or mass effect. 2. Small chronic white matter and cortically based infarcts throughout the left MCA territory.  3. Small chronic right thalamic lacunar infarct.     Assessment: 59 y.o. male with history of previous stroke and multiple risk factors for stroke who developed dysarthria Friday evening but did not present for evaluation until today. MRI of the brain was consistent with an acute lacunar type infarct as noted above. The patient has not been compliant with medical follow-up or medical therapy. He had been a daily smoker up until this admission. His dysarthria has already shown some improvement.  The patient realizes that this is a wake-up call and he is determined to quit smoking and resume medical therapy. Full stroke workup planned and already initiated.  Stroke Risk Factors - carotid stenosis, diabetes mellitus, family history, hyperlipidemia, hypertension and smoking  Plan: 1. HgbA1c, fasting lipid panel 2. MRI, MRA  of the brain without contrast 3. PT consult, OT consult, Speech consult 4. Echocardiogram 5. Carotid dopplers 6. Prophylactic therapy- consider change to Plavix 7. NPO until RN stroke swallow screen 8. Telemetry  monitoring 9. Frequent neuro checks  Dr. Armida Sans  to follow with definitive assessment and plan.  Chad Bussing PA-C [facility] Pager [phone] 09/04/2014, 5:33 PM Patient seen and examined together with physician assistant and I concur with the assessment and plan.  Dorian Pod, MD

## 2014-09-04 NOTE — Evaluation (Signed)
Physical Therapy Evaluation Patient Details Name: [name] MRN: [id number] DOB: 1955-05-24 Today's Date: 09/04/2014   History of Present Illness  Patient is a 59 yo male admitted 09/03/14 with slurred speech and Rt hand weakness.  PMH:  CVA, DM, neuropathy, HTN, OSA, tobacco use  Clinical Impression  Patient is functioning at supervision/independent level with all mobility and gait.  Good balance with gait.  No acute PT needs - PT will sign off.   Wife staying with her mother (dementia).  Patient to stay with them initially at discharge for 24 hour supervision availability.    Follow Up Recommendations No PT follow up;Supervision for mobility/OOB    Equipment Recommendations  None recommended by PT    Recommendations for Other Services       Precautions / Restrictions Precautions Precautions: None Restrictions Weight Bearing Restrictions: No      Mobility  Bed Mobility Overal bed mobility: Independent                Transfers Overall transfer level: Needs assistance Equipment used: None Transfers: Sit to/from Stand Sit to Stand: Supervision         General transfer comment: Assist for safety and IV pole.  Ambulation/Gait Ambulation/Gait assistance: Supervision Ambulation Distance (Feet): 40 Feet Assistive device: None Gait Pattern/deviations: Step-through pattern;Decreased stride length   Gait velocity interpretation: at or above normal speed for age/gender General Gait Details: Patient with good gait pattern, balance, and speed.  Assist for safety only.  Stairs            Wheelchair Mobility    Modified Rankin (Stroke Patients Only) Modified Rankin (Stroke Patients Only) Pre-Morbid Rankin Score: No symptoms Modified Rankin: No significant disability     Balance Overall balance assessment: No apparent balance deficits (not formally assessed)         Standing balance support: No upper extremity supported Standing balance-Leahy  Scale: Normal               High level balance activites: Direction changes;Turns;Sudden stops;Head turns High Level Balance Comments: No loss of balance with high level balance activities             Pertinent Vitals/Pain Pain Assessment: No/denies pain    Home Living Family/patient expects to be discharged to:: Private residence Living Arrangements: Alone Available Help at Discharge: Family;Available 24 hours/day (Wife moved in with her mother-dementia. Pt can go there.) Type of Home: House Home Access: Stairs to enter Entrance Stairs-Rails: None Entrance Stairs-Number of Steps: 2 Home Layout: One level Home Equipment: Walker - 2 wheels;Walker - 4 wheels;Cane - single point;Shower seat      Prior Function Level of Independence: Independent         Comments: Works in yard and garden     Journalist, newspaper   Dominant Hand: Right    Extremity/Trunk Assessment   Upper Extremity Assessment: Defer to OT evaluation (Patient reports decreased coord Rt hand after first CVA)           Lower Extremity Assessment: Overall WFL for tasks assessed      Cervical / Trunk Assessment: Normal  Communication   Communication: Expressive difficulties  Cognition Arousal/Alertness: Awake/alert Behavior During Therapy: Flat affect;Anxious (Tearful at end of session re: speech) Overall Cognitive Status: Within Functional Limits for tasks assessed                      General Comments  Education provided on warning signs of stroke and calling  911.  Patient voiced understanding.    Exercises        Assessment/Plan    PT Assessment Patent does not need any further PT services  PT Diagnosis Hemiplegia dominant side   PT Problem List    PT Treatment Interventions     PT Goals (Current goals can be found in the Care Plan section) Acute Rehab PT Goals PT Goal Formulation: All assessment and education complete, DC therapy    Frequency     Barriers to discharge         Co-evaluation               End of Session   Activity Tolerance: Patient tolerated treatment well Patient left: in bed;with call bell/phone within reach;with family/visitor present Nurse Communication: Mobility status    Functional Assessment Tool Used: Clinical judgement Functional Limitation: Mobility: Walking and moving around Mobility: Walking and Moving Around Current Status (T5974): 0 percent impaired, limited or restricted Mobility: Walking and Moving Around Goal Status [phone]): 0 percent impaired, limited or restricted Mobility: Walking and Moving Around Discharge Status [phone]): 0 percent impaired, limited or restricted    Time: 8032-1224 PT Time Calculation (min) (ACUTE ONLY): 19 min   Charges:   PT Evaluation $Initial PT Evaluation Tier I: 1 Procedure     PT G Codes:   PT G-Codes **NOT FOR INPATIENT CLASS** Functional Assessment Tool Used: Clinical judgement Functional Limitation: Mobility: Walking and moving around Mobility: Walking and Moving Around Current Status (M2500): 0 percent impaired, limited or restricted Mobility: Walking and Moving Around Goal Status (B7048): 0 percent impaired, limited or restricted Mobility: Walking and Moving Around Discharge Status (G8916): 0 percent impaired, limited or restricted    Despina Pole 09/04/2014, 11:56 AM Carita Pian. Sanjuana Kava, [place] Pager [phone]

## 2014-09-04 NOTE — Progress Notes (Signed)
Subjective: Patient with improved speech though still having some slurring and somewhat difficult to understand. Reports weakness in right hand has improved. Counseled about smoking cessation and reports he his done smoking.  Objective: Vital signs in last 24 hours: Filed Vitals:   09/04/14 0300 09/04/14 0500 09/04/14 0700 09/04/14 0940  BP: 151/69 141/72 150/97   Pulse: 71 62 78   Temp: 98.1 F (36.7 C) 98 F (36.7 C) 99 F (37.2 C)   TempSrc: Oral Oral Oral   Resp: 19 19 19    Weight:    107.865 kg (237 lb 12.8 oz)  SpO2: 100% 98% 100%    Weight change:  No intake or output data in the 24 hours ending 09/04/14 1123   Physical Exam GENERAL- alert, co-operative, not in any distress, speech slurred but improved from previous. CARDIAC- RRR, no murmurs, rubs or gallops. RESP- Moving equal volumes of air, and clear to auscultation bilaterally, no wheezes or crackles. ABDOMEN- Soft, nontender, no guarding or rebound, no palpable masses or organomegaly, bowel sounds present. NEURO- pupils equal, constricted, mildly reactive to light; normal eyebrow raise, normal eyelid close, mild dropping of R side of mouth with smile improved from previous, symmetrical palate elevation, normal lateral head rotation bilaterally, normal shoulder shrug bilaterally, symmetrical tongue protrusion, 5/5 strength biceps/triceps bilaterally, pinky-to-thumb strength 1/5 in right hand unchanged from previous and 5/5 in left hand EXTREMITIES- pulse 2+, symmetric, no pedal edema. SKIN- Warm, dry, No rash or lesion. PSYCH- Normal mood and affect, appropriate thought content and speech.  Lab Results: Basic Metabolic Panel:  Recent Labs Lab 09/03/14 1047 09/04/14 0405  NA 138 137  K 4.0 4.5  CL 108 108  CO2 19* 23  GLUCOSE 120* 93  BUN 23* 20  CREATININE 1.79* 1.63*  CALCIUM 9.3 8.9   Liver Function Tests:  Recent Labs Lab 09/03/14 1047  AST 18  ALT 16*  ALKPHOS 90  BILITOT 0.8  PROT 7.3    ALBUMIN 4.1   CBC:  Recent Labs Lab 09/03/14 1047  WBC 9.5  NEUTROABS 7.3  HGB 16.7  HCT 50.1  MCV 90.1  PLT 179   CBG:  Recent Labs Lab 09/03/14 1046  GLUCAP 109*   Hemoglobin A1C: No results for input(s): HGBA1C in the last 168 hours. Fasting Lipid Panel:  Recent Labs Lab 09/04/14 0405  CHOL 203*  HDL 31*  LDLCALC 151*  TRIG 106  CHOLHDL 6.5   Coagulation:  Recent Labs Lab 09/03/14 1047  LABPROT 14.2  INR 1.08   Urine Drug Screen: Drugs of Abuse     Component Value Date/Time   LABOPIA NEGATIVE 08/01/2009 0507   LABOPIA NONE DETECTED 11/25/2007 2100   COCAINSCRNUR NEGATIVE 08/01/2009 0507   COCAINSCRNUR NONE DETECTED 11/25/2007 2100   LABBENZ * 08/01/2009 0507    POSITIVE (NOTE) Result repeated and verified. Sent for confirmatory testing   [place] DETECTED 11/25/2007 2100   AMPHETMU NEGATIVE 08/01/2009 0507   AMPHETMU NONE DETECTED 11/25/2007 2100   THCU NONE DETECTED 11/25/2007 2100   LABBARB  11/25/2007 2100    NONE DETECTED        DRUG SCREEN FOR MEDICAL PURPOSES ONLY.  IF CONFIRMATION IS NEEDED FOR ANY PURPOSE, NOTIFY LAB WITHIN 5 DAYS.    Alcohol Level:  Recent Labs Lab 09/03/14 1047  ETH <5   Urinalysis:  Recent Labs Lab 09/04/14 0755  COLORURINE YELLOW  LABSPEC 1.012  PHURINE 5.5  GLUCOSEU NEGATIVE  HGBUR NEGATIVE  BILIRUBINUR NEGATIVE  KETONESUR NEGATIVE  PROTEINUR NEGATIVE  UROBILINOGEN 1.0  NITRITE NEGATIVE  LEUKOCYTESUR NEGATIVE   Micro Results: No results found for this or any previous visit (from the past 240 hour(s)). Studies/Results: Ct Head Wo Contrast  09/03/2014   CLINICAL DATA:  Possible stroke. Bilateral hand weakness. Difficulty with speech and walking.  EXAM: CT HEAD WITHOUT CONTRAST  TECHNIQUE: Contiguous axial images were obtained from the base of the skull through the vertex without intravenous contrast.  COMPARISON:  11/26/2008  FINDINGS: Left frontal lobe low-attenuation appears increased  from previous exam, image 23/ series 201. New area of subcortical low attenuation within the posterior left frontal lobe, image 25/series 201. There is no evidence for acute cortical infarct. No intracranial hemorrhage or mass. The ventricular volumes and sulci appear normal. The paranasal sinuses appear clear. The mastoid air cells are also clear. The calvarium appears intact  IMPRESSION: 1. Progression of left frontal and parietal lobe deep white matter infarcts. 2. No acute intracranial hemorrhage, stroke or mass noted.   Electronically Signed   By: Kerby Moors M.D.   On: 09/03/2014 12:15   Mr Brain Wo Contrast  09/03/2014   CLINICAL DATA:  59 year old male with slurred speech since midnight which began after a fall. Initial encounter.  EXAM: MRI HEAD WITHOUT CONTRAST  TECHNIQUE: Multiplanar, multiecho pulse sequences of the brain and surrounding structures were obtained without intravenous contrast.  COMPARISON:  Head CT without contrast 1159 hours today, and earlier.  FINDINGS: Strict a diffusion extending from the left corona radiata caudally through the external capsule along a segment of about 3 cm (series 8, image 19). Minimal to mild associated T2 and FLAIR hyperintensity. No associated hemorrhage or mass effect.  Major intracranial vascular flow voids are preserved. No contralateral right hemisphere or posterior fossa restricted diffusion.  However, there are multifocal chronic lacunar infarcts in the left hemisphere including both small cortically based lesions (left superior frontal gyrus and parietal lobe series 6, images 22 and 23), and multifocal corona radiata involvement (including at the left frontal horn which was identified on the earlier CT). There is mild hemosiderin associated with that lesion. There is also a chronic lacunar infarct of the right lateral thalamus near the posterior limb of the right internal capsule. Nearby T2 heterogeneity in the right basal ganglia may reflect small  perivascular spaces.  No midline shift, mass effect, evidence of mass lesion, ventriculomegaly, extra-axial collection or acute intracranial hemorrhage. Cervicomedullary junction and pituitary are within normal limits. Brainstem and cerebellum are within normal limits.  Visible internal auditory structures appear normal. Mastoids are clear. Paranasal sinuses are clear. Orbits soft tissues appear normal. Visualized scalp soft tissues are within normal limits. Negative visualized cervical spine. Normal bone marrow signal.  IMPRESSION: 1. Acute on chronic small vessel ischemia. Acute lacunar type infarct extending from the left corona radiata through the left external capsule. No hemorrhage or mass effect. 2. Small chronic white matter and cortically based infarcts throughout the left MCA territory. Small chronic right thalamic lacunar infarct. 3.   Electronically Signed   By: Genevie Ann M.D.   On: 09/03/2014 15:36   Medications: I have reviewed the patient's current medications. Scheduled Meds: . aspirin  81 mg Oral Daily  . atorvastatin  40 mg Oral q1800  . heparin  5,000 Units Subcutaneous 3 times per day   Continuous Infusions:  PRN Meds:.pregabalin Assessment/Plan: Principal Problem:   CVA (cerebral infarction) Active Problems:   Diabetes mellitus   Hyperlipidemia   Hypertension   Smoker  CKD (chronic kidney disease)   OSA (obstructive sleep apnea)  CVA (cerebral infarction): Speech and facial droop improved from previous but not resolved. Right hand weakness remains unchanged on exam but pt reports improvement. Will consult neuro today. - ECHO pending - Carotid Doppler pending - Continue telemetry  - PT/OT evaluation today - Neuro checks q4hrs - ASA 325 mg qdaily - Neuro consulted  CKD: Cr on admission was 1.79.  Improved to 1.63 today. Baseline Cr on previousadmissions is around 1.3 but has not had any follow up or treatment so baseline is uncertain. Will recheck Cr/GFR in AM and  reassess starting an ACE-I. - D/C IVF, <0.3 improvement in Cr with fluids - UA normal - Check BMP in AM  Diabetes Mellitus Type II: Glucose 93 this AM. Currently not taking any medications, believes it to be well controlled with lifestyle modifications. Was previously on Metformin, never required insulin.  - HgA1c pending  Hyperlipidemia: LDL 151, HDL 31 Total Cholesterol 203.  - Start atorvastatin 40 mg qdaily  HTN: SBP 140-160s overnight. Will allow permissive HTN in the setting of acute ischemic stroke for 24 hours. Will assess BP medication requirements after 24 hours and will need follow up outpatient.   Smoker: Patient reports he is giving up smoking. Not having any cravings in the hospital. Does wish to have any resources to help quit.   OSA: Patient has reported history of OSA but has never had follow up or had CPAP at night. He desatted to 89% on RA overnight and was put on 2L  with sats returning to upper 90s.  - Will check O2 sat today and assess need for supplemental O2 - Will need sleep study follow up as outpatient  DVT PPX: Heparin 5000 units SQ q8hr  Code status: FULL CODE  Dispo: Disposition is deferred at this time, awaiting improvement of current medical problems.  Anticipated discharge in approximately 1-2 day(s).   The patient does not have a current PCP (No Pcp Per Patient) and does need an [facility] hospital follow-up appointment after discharge.  The patient does not have transportation limitations that hinder transportation to clinic appointments.  [name], MD 09/04/2014, 11:23 AM

## 2014-09-04 NOTE — Evaluation (Signed)
Occupational Therapy Evaluation and Discharge Patient Details Name: [name] MRN: [id number] DOB: [birth year] Today's Date: 09/04/2014    History of Present Illness Patient is a 59 yo male admitted 09/03/14 with slurred speech and Rt hand weakness.  PMH:  CVA, DM, neuropathy, HTN, OSA, tobacco use   Clinical Impression   This 59 yo male admitted with above presents to acute OT at a Mod I to independent level for BADLs, acute OT will sign off.    Follow Up Recommendations  No OT follow up    Equipment Recommendations  None recommended by OT       Precautions / Restrictions Precautions Precautions: None Restrictions Weight Bearing Restrictions: No      Mobility Bed Mobility Overal bed mobility: Independent                Transfers Overall transfer level: Independent Equipment used: None Transfers: Sit to/from Stand Sit to Stand: Independent         General transfer comment: ambulated around 1/4 of unit without LOB; looking up, down, right and left         ADL Overall ADL's : Modified independent                                             Vision Additional Comments: No change from baseline          Pertinent Vitals/Pain Pain Assessment: No/denies pain     Hand Dominance Right   Extremity/Trunk Assessment Upper Extremity Assessment Upper Extremity Assessment: Overall WFL for tasks assessed     Communication Communication Communication: Expressive difficulties   Cognition Arousal/Alertness: Awake/alert Behavior During Therapy: Flat affect Overall Cognitive Status: Within Functional Limits for tasks assessed                                Home Living Family/patient expects to be discharged to:: Private residence Living Arrangements: Alone Available Help at Discharge: Family;Available 24 hours/day (wife moved in with mother with dementia, pt can go there short term before returning home) Type of  Home: House Home Access: Stairs to enter [employer] of Steps: 2 Entrance Stairs-Rails: None Home Layout: One level     Bathroom Shower/Tub: Walk-in shower;Door   [employer] Toilet: Standard     Home Equipment: Environmental consultant - 2 wheels;Walker - 4 wheels;Cane - single point;Shower seat          Prior Functioning/Environment Level of Independence: Independent        Comments: Works in yard and garden    OT Diagnosis: Generalized weakness         OT Goals(Current goals can be found in the care plan section) Acute Rehab OT Goals Patient Stated Goal: to go home  OT Frequency:                End of Session Equipment Utilized During Treatment:  (none) Nurse Communication:  (started sign off sheet for pt to be up independently)  Activity Tolerance: Patient tolerated treatment well Patient left: in chair;with call bell/phone within reach;with family/visitor present   Time: 2725-3664 OT Time Calculation (min): 17 min Charges:  OT General Charges $OT Visit: 1 Procedure OT Evaluation $Initial OT Evaluation Tier I: 1 Procedure G-Codes: OT G-codes **NOT FOR INPATIENT CLASS** Functional Assessment Tool Used: clinical observation Functional Limitation: Self  care Self Care Current Status [phone]): At least 1 percent but less than 20 percent impaired, limited or restricted Self Care Goal Status (Z6629): At least 1 percent but less than 20 percent impaired, limited or restricted Self Care Discharge Status [phone]): At least 1 percent but less than 20 percent impaired, limited or restricted  Almon Register [phone] 09/04/2014, 12:16 PM

## 2014-09-04 NOTE — Progress Notes (Signed)
VASCULAR LAB PRELIMINARY  PRELIMINARY  PRELIMINARY  PRELIMINARY  Carotid Dopplers completed.    Preliminary report:  1-39% ICA stenosis.  Vertebral artery flow is antegrade.   Michal Strzelecki, RVT 09/04/2014, 3:42 PM

## 2014-09-04 NOTE — Progress Notes (Signed)
Subjective: Speech improved greatly, but still mild slurred speech present. He reports improvement of right hand strength, but on exam remained the same as yesterday. Has decided to quit smoking today and did not want any offered resources.  Objective: Vital signs in last 24 hours: Filed Vitals:   09/04/14 0100 09/04/14 0300 09/04/14 0500 09/04/14 0700  BP: 147/102 151/69 141/72 150/97  Pulse: 66 71 62 78  Temp: 98 F (36.7 C) 98.1 F (36.7 C) 98 F (36.7 C) 99 F (37.2 C)  TempSrc: Oral Oral Oral Oral  Resp: 18 19 19 19   SpO2: 100% 100% 98% 100%   Weight change:  No intake or output data in the 24 hours ending 09/04/14 0841 Physical Exam: General: alert, cooperative, in no acute distress, speech mildly slurred but improving from yesterday CV: RRR, normal S1 and S2, no murmurs, no LE edema, +2 pulse DP bilaterally Lung: LCAB, no increased work of breathing, no wheezing or crackles Abd: normal bowel sounds, soft, nontender Neuro: pupils equal, constricted, mildly reactive to light; normal eyebrow raise, normal eyelid close, mild dropping of R side of mouth with smile, symmetrical palate elevation, normal lateral head rotation bilaterally, normal shoulder shrug bilaterally, symmetrical tongue protrusion, 5/5 strength biceps/triceps bilaterally, pinky-to-thumb strength 1/5 in right hand and 5/5 in left hand  Lab Results: Results for orders placed or performed during the hospital encounter of 09/03/14 (from the past 24 hour(s))  CBG monitoring, ED     Status: Abnormal   Collection Time: 09/03/14 10:46 AM  Result Value Ref Range   Glucose-Capillary 109 (H) 65 - 99 mg/dL  Protime-INR     Status: None   Collection Time: 09/03/14 10:47 AM  Result Value Ref Range   Prothrombin Time 14.2 11.6 - 15.2 seconds   INR 1.08 0.00 - 1.49  APTT     Status: None   Collection Time: 09/03/14 10:47 AM  Result Value Ref Range   aPTT 28 24 - 37 seconds  CBC     Status: None   Collection Time:  09/03/14 10:47 AM  Result Value Ref Range   WBC 9.5 4.0 - 10.5 K/uL   RBC 5.56 4.22 - 5.81 MIL/uL   Hemoglobin 16.7 13.0 - 17.0 g/dL   HCT 50.1 39.0 - 52.0 %   MCV 90.1 78.0 - 100.0 fL   MCH 30.0 26.0 - 34.0 pg   MCHC 33.3 30.0 - 36.0 g/dL   RDW 14.1 11.5 - 15.5 %   Platelets 179 150 - 400 K/uL  Differential     Status: None   Collection Time: 09/03/14 10:47 AM  Result Value Ref Range   Neutrophils Relative % 77 43 - 77 %   Neutro Abs 7.3 1.7 - 7.7 K/uL   Lymphocytes Relative 15 12 - 46 %   Lymphs Abs 1.4 0.7 - 4.0 K/uL   Monocytes Relative 6 3 - 12 %   Monocytes Absolute 0.5 0.1 - 1.0 K/uL   Eosinophils Relative 2 0 - 5 %   Eosinophils Absolute 0.2 0.0 - 0.7 K/uL   Basophils Relative 0 0 - 1 %   Basophils Absolute 0.0 0.0 - 0.1 K/uL  Comprehensive metabolic panel     Status: Abnormal   Collection Time: 09/03/14 10:47 AM  Result Value Ref Range   Sodium 138 135 - 145 mmol/L   Potassium 4.0 3.5 - 5.1 mmol/L   Chloride 108 101 - 111 mmol/L   CO2 19 (L) 22 - 32 mmol/L  Glucose, Bld 120 (H) 65 - 99 mg/dL   BUN 23 (H) 6 - 20 mg/dL   Creatinine, Ser 1.79 (H) 0.61 - 1.24 mg/dL   Calcium 9.3 8.9 - 10.3 mg/dL   Total Protein 7.3 6.5 - 8.1 g/dL   Albumin 4.1 3.5 - 5.0 g/dL   AST 18 15 - 41 U/L   ALT 16 (L) 17 - 63 U/L   Alkaline Phosphatase 90 38 - 126 U/L   Total Bilirubin 0.8 0.3 - 1.2 mg/dL   GFR calc non Af Amer 41 (L) >60 mL/min   GFR calc Af Amer 47 (L) >60 mL/min   Anion gap 11 5 - 15  Ethanol     Status: None   Collection Time: 09/03/14 10:47 AM  Result Value Ref Range   Alcohol, Ethyl (B) <5 <5 mg/dL  I-stat troponin, ED (not at [facility])     Status: None   Collection Time: 09/03/14 10:51 AM  Result Value Ref Range   Troponin i, poc 0.01 0.00 - 0.08 ng/mL   Comment 3          Basic metabolic panel     Status: Abnormal   Collection Time: 09/04/14  4:05 AM  Result Value Ref Range   Sodium 137 135 - 145 mmol/L   Potassium 4.5 3.5 - 5.1 mmol/L   Chloride 108 101  - 111 mmol/L   CO2 23 22 - 32 mmol/L   Glucose, Bld 93 65 - 99 mg/dL   BUN 20 6 - 20 mg/dL   Creatinine, Ser 1.63 (H) 0.61 - 1.24 mg/dL   Calcium 8.9 8.9 - 10.3 mg/dL   GFR calc non Af Amer 46 (L) >60 mL/min   GFR calc Af Amer 53 (L) >60 mL/min   Anion gap 6 5 - 15  Lipid panel     Status: Abnormal   Collection Time: 09/04/14  4:05 AM  Result Value Ref Range   Cholesterol 203 (H) 0 - 200 mg/dL   Triglycerides 106 <150 mg/dL   HDL 31 (L) >40 mg/dL   Total CHOL/HDL Ratio 6.5 RATIO   VLDL 21 0 - 40 mg/dL   LDL Cholesterol 151 (H) 0 - 99 mg/dL  Urinalysis, Routine w reflex microscopic (not at [facility])     Status: None   Collection Time: 09/04/14  7:55 AM  Result Value Ref Range   Color, Urine YELLOW YELLOW   APPearance CLEAR CLEAR   Specific Gravity, Urine 1.012 1.005 - 1.030   pH 5.5 5.0 - 8.0   Glucose, UA NEGATIVE NEGATIVE mg/dL   Hgb urine dipstick NEGATIVE NEGATIVE   Bilirubin Urine NEGATIVE NEGATIVE   Ketones, ur NEGATIVE NEGATIVE mg/dL   Protein, ur NEGATIVE NEGATIVE mg/dL   Urobilinogen, UA 1.0 0.0 - 1.0 mg/dL   Nitrite NEGATIVE NEGATIVE   Leukocytes, UA NEGATIVE NEGATIVE   Micro Results: No results found for this or any previous visit (from the past 240 hour(s)). Studies/Results: Ct Head Wo Contrast  09/03/2014   CLINICAL DATA:  Possible stroke. Bilateral hand weakness. Difficulty with speech and walking.  EXAM: CT HEAD WITHOUT CONTRAST  TECHNIQUE: Contiguous axial images were obtained from the base of the skull through the vertex without intravenous contrast.  COMPARISON:  11/26/2008  FINDINGS: Left frontal lobe low-attenuation appears increased from previous exam, image 23/ series 201. New area of subcortical low attenuation within the posterior left frontal lobe, image 25/series 201. There is no evidence for acute cortical infarct. No intracranial hemorrhage  or mass. The ventricular volumes and sulci appear normal. The paranasal sinuses appear clear. The mastoid air cells  are also clear. The calvarium appears intact  IMPRESSION: 1. Progression of left frontal and parietal lobe deep white matter infarcts. 2. No acute intracranial hemorrhage, stroke or mass noted.   Electronically Signed   By: Kerby Moors M.D.   On: 09/03/2014 12:15   Mr Brain Wo Contrast  09/03/2014   CLINICAL DATA:  59 year old male with slurred speech since midnight which began after a fall. Initial encounter.  EXAM: MRI HEAD WITHOUT CONTRAST  TECHNIQUE: Multiplanar, multiecho pulse sequences of the brain and surrounding structures were obtained without intravenous contrast.  COMPARISON:  Head CT without contrast 1159 hours today, and earlier.  FINDINGS: Strict a diffusion extending from the left corona radiata caudally through the external capsule along a segment of about 3 cm (series 8, image 19). Minimal to mild associated T2 and FLAIR hyperintensity. No associated hemorrhage or mass effect.  Major intracranial vascular flow voids are preserved. No contralateral right hemisphere or posterior fossa restricted diffusion.  However, there are multifocal chronic lacunar infarcts in the left hemisphere including both small cortically based lesions (left superior frontal gyrus and parietal lobe series 6, images 22 and 23), and multifocal corona radiata involvement (including at the left frontal horn which was identified on the earlier CT). There is mild hemosiderin associated with that lesion. There is also a chronic lacunar infarct of the right lateral thalamus near the posterior limb of the right internal capsule. Nearby T2 heterogeneity in the right basal ganglia may reflect small perivascular spaces.  No midline shift, mass effect, evidence of mass lesion, ventriculomegaly, extra-axial collection or acute intracranial hemorrhage. Cervicomedullary junction and pituitary are within normal limits. Brainstem and cerebellum are within normal limits.  Visible internal auditory structures appear normal. Mastoids are  clear. Paranasal sinuses are clear. Orbits soft tissues appear normal. Visualized scalp soft tissues are within normal limits. Negative visualized cervical spine. Normal bone marrow signal.  IMPRESSION: 1. Acute on chronic small vessel ischemia. Acute lacunar type infarct extending from the left corona radiata through the left external capsule. No hemorrhage or mass effect. 2. Small chronic white matter and cortically based infarcts throughout the left MCA territory. Small chronic right thalamic lacunar infarct. 3.   Electronically Signed   By: Genevie Ann M.D.   On: 09/03/2014 15:36   Medications: I have reviewed the patient's current medications. Scheduled Meds: . aspirin  81 mg Oral Daily  . heparin  5,000 Units Subcutaneous 3 times per day   Continuous Infusions: . sodium chloride 100 mL/hr at 09/04/14 0458   PRN Meds:.pregabalin Assessment/Plan: Principal Problem:   CVA (cerebral infarction) Active Problems:   Diabetes mellitus   Hyperlipidemia   Hypertension   Smoker   CKD (chronic kidney disease)   OSA (obstructive sleep apnea)  DALVIN CLIPPER is a 59 y.o. male with a past history significant for stroke (2009), carotid artery stenosis (left carotid 80% stenosis in 2009, s/p stent placement '09), CKD, OSA, HTN, HLD, and DMT2 who presented with slurred speech and worsening of previous R hand weakness, due to an acute left lacunar stroke.  Acute Left Lacunar Stroke:  Improving symptoms. Will assess for chronic conditions (CAS, HTN, CKD, HLD, DMT2) for secondary stroke prevention. Has agreed to stop smoking. Neuro consult team will see him today. - aspirin 325 mg daily - Echo today - on telemetry -neuro check q4hr  Carotid Artery Stenosis: s/p stent  placement in 2009 when L carotid at 80% stenosis. May be possible etiology for current stroke, will need to reassess. - US Carotid Arteries today  HTN: Blood pressure elevated Systolic BP [id number]. He does not take any home  medications for his blood pressure. Will not treat for now due to acute stroke, but will continue to monitor. Will need follow up with PCP to treat chronic HTN. Can consider starting ACE-I for HTN/CKD.  CKD: Has not been taking medications for chronic CKD. Today's creatinine at 1.63 (baseline 1.4). UA normal. Will recheck creatinine/GFR tomorrow to determine benefits of starting ACE-I. - d/c NS IV 100 mL/hr  HLD: Known condition but previously not taking any medications. Lipid Panel showed mild inc in total cholesterol, decreased HDL (31) and increased LDL (151).  - start atorvastatin 40mg  daily  DMT2: Currently treated with lifestyle modifications. Previously on Metformin. Glucose 93 this morning. - pending Hgb A1c  Obstructive Sleep Apnea: Has a history of OSA but was not using a CPAP. Overnight oxygen saturations dropped to 89, so he was given 2L of oxygen via nasal cannula, with improvements of O2 sat to upper 90's.   Dispo: Depending on results of tests, estimated discharge in 1-2 days.  -Needs to establish PCP  FEN: heart healthy  DVT Prophylaxis: heparin Code Status: Full  This is a Careers information officer Note.  The care of the patient was discussed with Dr. Denton Brick and Dr. Charlynn Grimes and the assessment and plan formulated with their assistance.  Please see their attached note for official documentation of the daily encounter.     Debbra Riding, Med Student 09/04/2014, 8:41 AM

## 2014-09-04 NOTE — Progress Notes (Signed)
  Echocardiogram 2D Echocardiogram has been performed.  [name] 09/04/2014, 9:16 AM

## 2014-09-05 ENCOUNTER — Inpatient Hospital Stay (HOSPITAL_COMMUNITY): Payer: No Typology Code available for payment source

## 2014-09-05 DIAGNOSIS — G4733 Obstructive sleep apnea (adult) (pediatric): Secondary | ICD-10-CM

## 2014-09-05 DIAGNOSIS — I639 Cerebral infarction, unspecified: Secondary | ICD-10-CM

## 2014-09-05 DIAGNOSIS — I63312 Cerebral infarction due to thrombosis of left middle cerebral artery: Secondary | ICD-10-CM

## 2014-09-05 DIAGNOSIS — N189 Chronic kidney disease, unspecified: Secondary | ICD-10-CM

## 2014-09-05 DIAGNOSIS — E785 Hyperlipidemia, unspecified: Secondary | ICD-10-CM

## 2014-09-05 DIAGNOSIS — R471 Dysarthria and anarthria: Secondary | ICD-10-CM

## 2014-09-05 DIAGNOSIS — Z720 Tobacco use: Secondary | ICD-10-CM

## 2014-09-05 DIAGNOSIS — I10 Essential (primary) hypertension: Secondary | ICD-10-CM

## 2014-09-05 LAB — HEMOGLOBIN A1C
HEMOGLOBIN A1C: 6 % — AB (ref 4.8–5.6)
HEMOGLOBIN A1C: 5.9 % — AB (ref 4.8–5.6)
MEAN PLASMA GLUCOSE: 126 mg/dL
MEAN PLASMA GLUCOSE: 123 mg/dL

## 2014-09-05 MED ORDER — LISINOPRIL 10 MG PO TABS: 10.0000 mg | ORAL_TABLET | Freq: Every day | ORAL | Status: DC

## 2014-09-05 MED ORDER — LORAZEPAM 2 MG/ML IJ SOLN: 0.5000 mg | Freq: Once | INTRAMUSCULAR | Status: DC

## 2014-09-05 MED ORDER — ATORVASTATIN CALCIUM 40 MG PO TABS: 40.0000 mg | ORAL_TABLET | Freq: Every day | ORAL | Status: DC

## 2014-09-05 MED ORDER — LISINOPRIL 10 MG PO TABS
10.0000 mg | ORAL_TABLET | Freq: Every day | ORAL | Status: DC
  Administered 2014-09-05: 10 mg via ORAL
  Filled 2014-09-05: qty 1

## 2014-09-05 NOTE — Progress Notes (Signed)
Patient declined EMMI at this time.

## 2014-09-05 NOTE — Progress Notes (Addendum)
STROKE TEAM PROGRESS NOTE   HISTORY [name] is a 59 y.o. male with history of a previous CVA in 2009, hypertension, hyperlipidemia, previous carotid artery stenting, diabetes mellitus, and obstructive sleep apnea, who has not had regular medical follow-up for several years and apparently stopped taking all of his medications several months ago. Late Friday evening early Saturday morning the patient developed dysarthria (LKW 09/02/2014, time unknown). He did not seek medical attention at that time and went about his normal activities. He came to the emergency department today where an MRI showed an acute lacunar type infarct extending from the left corona radiata through the left external capsule. The patient was admitted for further evaluation and treatment. A neurology consult was requested. The patient and his family feel that his dysarthria has improved somewhat since admission. He denies any unilateral weakness. He states he is determined to quit smoking and will be compliant with medical therapy from here on out. Patient was not administered TPA secondary to late presentation and minimal deficits. He was admitted  for further evaluation and treatment.   SUBJECTIVE (INTERVAL HISTORY) His significant other is at the bedside; she reports she smokes and she knows she should quit.  Overall he feels his condition is stable.    OBJECTIVE Temp:  [98.1 F (36.7 C)-98.8 F (37.1 C)] 98.6 F (37 C) (07/18 0943) Pulse Rate:  [59-76] 72 (07/18 0943) Cardiac Rhythm:  [-] Normal sinus rhythm (07/17 1944) Resp:  [18] 18 (07/18 0943) BP: (138-178)/(79-84) 178/84 mmHg (07/18 0943) SpO2:  [97 %-99 %] 98 % (07/18 0943) Weight:  [107.412 kg (236 lb 12.8 oz)] 107.412 kg (236 lb 12.8 oz) (07/18 0500)   Recent Labs Lab 09/03/14 1046  GLUCAP 109*    Recent Labs Lab 09/03/14 1047 09/04/14 0405  NA 138 137  K 4.0 4.5  CL 108 108  CO2 19* 23  GLUCOSE 120* 93  BUN 23* 20  CREATININE 1.79*  1.63*  CALCIUM 9.3 8.9    Recent Labs Lab 09/03/14 1047  AST 18  ALT 16*  ALKPHOS 90  BILITOT 0.8  PROT 7.3  ALBUMIN 4.1    Recent Labs Lab 09/03/14 1047  WBC 9.5  NEUTROABS 7.3  HGB 16.7  HCT 50.1  MCV 90.1  PLT 179   No results for input(s): CKTOTAL, CKMB, CKMBINDEX, TROPONINI in the last 168 hours.  Recent Labs  09/03/14 1047  LABPROT 14.2  INR 1.08    Recent Labs  09/04/14 0755  COLORURINE YELLOW  LABSPEC 1.012  PHURINE 5.5  GLUCOSEU NEGATIVE  HGBUR NEGATIVE  BILIRUBINUR NEGATIVE  KETONESUR NEGATIVE  PROTEINUR NEGATIVE  UROBILINOGEN 1.0  NITRITE NEGATIVE  LEUKOCYTESUR NEGATIVE       Component Value Date/Time   CHOL 203* 09/04/2014 0405   TRIG 106 09/04/2014 0405   HDL 31* 09/04/2014 0405   CHOLHDL 6.5 09/04/2014 0405   VLDL 21 09/04/2014 0405   LDLCALC 151* 09/04/2014 0405   Lab Results  Component Value Date   HGBA1C 5.8* 02/06/2010      Component Value Date/Time   LABOPIA NEGATIVE 08/01/2009 0507   LABOPIA NONE DETECTED 11/25/2007 2100   COCAINSCRNUR NEGATIVE 08/01/2009 0507   COCAINSCRNUR NONE DETECTED 11/25/2007 2100   LABBENZ * 08/01/2009 0507    POSITIVE (NOTE) Result repeated and verified. Sent for confirmatory testing   LABBENZ NONE DETECTED 11/25/2007 2100   AMPHETMU NEGATIVE 08/01/2009 0507   AMPHETMU NONE DETECTED 11/25/2007 2100   THCU NONE DETECTED 11/25/2007 2100  LABBARB  11/25/2007 2100    NONE DETECTED        DRUG SCREEN FOR MEDICAL PURPOSES ONLY.  IF CONFIRMATION IS NEEDED FOR ANY PURPOSE, NOTIFY LAB WITHIN 5 DAYS.     Recent Labs Lab 09/03/14 1047  ETH <5    Ct Head Wo Contrast 09/03/2014    1. Progression of left frontal and parietal lobe deep white matter infarcts. 2. No acute intracranial hemorrhage, stroke or mass noted.     Mr Brain Wo Contrast 09/03/2014    1. Acute on chronic small vessel ischemia. Acute lacunar type infarct extending from the left corona radiata through the left external capsule.  No hemorrhage or mass effect. 2. Small chronic white matter and cortically based infarcts throughout the left MCA territory. Small chronic right thalamic lacunar infarct.   MRA brain Fetal origin left posterior cerebral artery which is occluded in the midportion. Anterior circulation shows no significant stenosis.  Carotid Doppler  There is 1-39% bilateral ICA stenosis. Vertebral artery flow is antegrade.    2D Echocardiogram   - Left ventricle: The cavity size was normal. Wall thickness wasincreased in a pattern of mild LVH. Systolic function was normal.The estimated ejection fraction was in the range of 60% to 65%.Wall motion was normal; there were no regional wall motionabnormalities. Doppler parameters are consistent with abnormalleft ventricular relaxation (grade 1 diastolic dysfunction). TheE/e&' ratio is >15, suggesting elevated LV filling pressure. - Aortic valve: Trileaflet. The left coronary cusp appears to beimmobile. There was no stenosis. There was no regurgitation. - Mitral valve: Calcified annulus. Calcified chordae. Trivial MR. - Left atrium: Moderately dilated at 42 ml/m2. - Right atrium: Mildly dilated at 22 cm2. - Inferior vena cava: The vessel was normal in size. Therespirophasic diameter changes were in the normal range (>= 50%),consistent with normal central venous pressure. Impressions: LVEF 60-65%, mild LVH, diastolic dysfunction, elevated LV fillingpressure, immobile left coronary cusp of the aortic valve(without significant stenosis), MAC with trivial MR, moderateLAE, mild RAE, normal IVC.  PHYSICAL EXAM Physical exam  Temp:  [98.1 F (36.7 C)-98.6 F (37 C)] 98.1 F (36.7 C) (07/18 1440) Pulse Rate:  [60-72] 60 (07/18 1440) Resp:  [18] 18 (07/18 1440) BP: (138-178)/(81-84) 138/81 mmHg (07/18 1440) SpO2:  [97 %-98 %] 97 % (07/18 1440)  General - Well nourished, well developed, in no apparent distress.  Ophthalmologic - Fundi not visualized due to  eye movement.  Cardiovascular - Regular rate and rhythm with no murmur.  Mental Status -  Level of arousal and orientation to time, place, and person were intact. Language including expression, naming, repetition, comprehension was assessed and found intact, however, mild dysarthria. Fund of Knowledge was assessed and was intact.  Cranial Nerves II - XII - II - Visual field intact OU. III, IV, VI - Extraocular movements intact. V - Facial sensation intact bilaterally. VII - Facial movement intact bilaterally. VIII - Hearing & vestibular intact bilaterally. X - Palate elevates symmetrically, mild dysarthria. XI - Chin turning & shoulder shrug intact bilaterally. XII - Tongue protrusion intact.  Motor Strength - The patient's strength was normal in all extremities and pronator drift was absent.  Bulk was normal and fasciculations were absent.   Motor Tone - Muscle tone was assessed at the neck and appendages and was normal.  Reflexes - The patient's reflexes were 1+ in all extremities and he had no pathological reflexes.  Sensory - Light touch, temperature/pinprick were assessed and were symmetrical.    Coordination - The patient  had normal movements in the hands and feet with no ataxia or dysmetria.  Tremor was absent.  Gait and Station - deferred due to fatigue.  ASSESSMENT/PLAN Mr. [name] is a 58 y.o. male with history of CVA in 2009, hypertension, hyperlipidemia, previous carotid artery stenting, diabetes mellitus, and obstructive sleep apnea, who has not had regular medical follow-up for several years and apparently stopped taking all of his medications several months ago presenting with dysarthria. He did not receive IV t-PA due to delay in arrival.   Stroke:  left corona radiata/external capsule lacunar infarct secondary to likelysmall vessel disease source  Resultant  dysarhria  MRI  left corona radiata/external capsule lacunar infarct  MRA  Recommend  checking for large vessel disease prior to discharge. If present, will need dual antiplatelet tx x 3 months  Carotid Doppler  No significant stenosis   2D Echo  EF 60-65%, no SOE  LDL 151  HgbA1c 5.9  Heparin 5000 units sq tid for VTE prophylaxis Diet Heart Room service appropriate?: Yes; Fluid consistency:: Thin  aspirin 81 mg orally every day prior to admission, now on aspirin 81 mg orally every day  Patient counseled to be compliant with his antithrombotic medications  Ongoing aggressive stroke risk factor management  Therapy recommendations:  No OT, no PT, ST pending  Disposition:  Return home  followup Dr. Erlinda Hong in 2 months. Order written.  Essential Hypertension  Home meds:   none  Permissive hypertension (OK if < 220/120) but gradually normalize in 5-7 days  Hyperlipidemia  Home meds:  none  LDL 151, goal < 70  Now on lipitor 40 mg daily  Continue statin at discharge  Diabetes type 2  HgbA1c 5.9 , goal < 7.0  Tobacco abuse  Current smoker  Smoking cessation counseling provided  Pt is willing to quit  Other Stroke Risk Factors  ETOH use  Obesity, Body mass index is 33.04 kg/(m^2). hx 120# weight loss  Hx stroke/TIA - 2009 visual deficits, R hand weakness  Family hx stroke (father)  Obstructive sleep apnea, on CPAP at home  Hx carotid stenting per notes  Other Active Problems  CKD  [facility] day # 1  Neurology will sign off. Please call with questions. Pt will follow up with Dr. Erlinda Hong at [facility] in about 2 months. Thanks for the consult.  Rosalin Hawking, MD PhD Stroke Neurology 09/06/2014 6:20 AM   To contact Stroke Continuity provider, please refer to [email or web address] After hours, contact General Neurology

## 2014-09-05 NOTE — Progress Notes (Signed)
  Date: 09/05/2014  Patient name: [name]  Medical record number: [id number]  Date of birth: 1956/02/17   This patient has been seen and the plan of care was discussed with the house staff. Please see their note for complete details. I concur with their findings with the following additions/corrections: Mr Dulworth was sitting in recliner with wife at bedside. No complaints today. Plan is for possible D/C after MRA is obtained to see if pt would benefit from dual anti-plt therapy. Dr Charlynn Grimes will start ACE today for BP control. Pt has been on ACE before without difficulty. A1C 5.9.  1. MRA 2. ACE therapy  3. Arrange F/U with PCP  4. Possible D/C on high intensity statin, ASA, Lisinopril. Possibly plavix. 5. Smoking cessation  Bartholomew Crews, MD 09/05/2014, 11:35 AM

## 2014-09-05 NOTE — Progress Notes (Signed)
Orders received for swallow evaluation however per RN, patient has passed the RN stroke swallow screen and is tolerating diet. Defer formal evaluation per protocol. Noted in HPI that patient had dysarthria upon admission. Please order SLP consult for cognitve-linguistic evaluation if needed.   Thank you  Gabriel Rainwater MA, CCC-SLP [phone]

## 2014-09-05 NOTE — Evaluation (Signed)
Speech Language Pathology Evaluation Patient Details Name: [name] MRN: [id number] DOB: 05-25-1955 Today's Date: 09/05/2014 Time: 1884-1660 SLP Time Calculation (min) (ACUTE ONLY): 19 min  Problem List:  Patient Active Problem List   Diagnosis Date Noted  . Dysarthria   . CVA (cerebral infarction) 09/03/2014  . Diabetes mellitus 09/03/2014  . Hyperlipidemia 09/03/2014  . Hypertension 09/03/2014  . Smoker 09/03/2014  . CKD (chronic kidney disease) 09/03/2014  . OSA (obstructive sleep apnea) 09/03/2014   Past Medical History:  Past Medical History  Diagnosis Date  . CVA (cerebral vascular accident) 2009  . Hypertension   . Hyperlipemia   . Diabetes type 2, controlled     controlled by lifestyle modification  . OSA (obstructive sleep apnea)    Past Surgical History:  Past Surgical History  Procedure Laterality Date  . Appendectomy    . Knee surgery     HPI:  [name] is a 59 y.o. male with history of a previous CVA in 2009, hypertension, hyperlipidemia, previous carotid artery stenting, diabetes mellitus, and obstructive sleep apnea, who has not had regular medical follow-up for several years and apparently stopped taking all of his medications several months ago. Late Friday evening early Saturday morning the patient developed dysarthria (LKW 09/02/2014, time unknown). He did not seek medical attention at that time and went about his normal activities. He came to the emergency department 7/17 where an MRI showed an acute lacunar type infarct extending from the left corona radiata through the left external capsule.    Assessment / Plan / Recommendation Clinical Impression  Cognitive-linguistic evaluation complete. Patient presents with a moderate dysarthria impacted by CN damage impacting labial and lingual strength and ROM as well as decreased respiratory status. In addition, patient with mild cognitive impairements impacting attention, short term memory,  and high level reasoning skills.  Education complete with patient and spouse regarding these deficits, potential impact on ADL, and precautions to utilize upon return home including use of written aids to facilitate recall of daily information, assistance with complex ADLs such as bill pay, medication management, and dysarthria strategies. OP SLP recommended after d/c.      SLP Assessment  All further Speech Lanaguage Pathology  needs can be addressed in the next venue of care    Follow Up Recommendations  Outpatient SLP    Frequency and Duration        Pertinent Vitals/Pain Pain Assessment: No/denies pain   SLP Goals     SLP Evaluation Prior Functioning  Cognitive/Linguistic Baseline: Within functional limits Type of Home: House  Lives With: Spouse Available Help at Discharge: Family;Available 24 hours/day   Cognition  Overall Cognitive Status: Impaired/Different from baseline Arousal/Alertness: Awake/alert Orientation Level: Oriented X4 Attention:  (mildly distracted ) Memory: Impaired Memory Impairment: Retrieval deficit;Decreased recall of new information Awareness: Appears intact Problem Solving: Appears intact Executive Function: Reasoning Reasoning: Impaired Reasoning Impairment: Verbal complex (mathmatical reasoning impaired) Safety/Judgment: Appears intact    Comprehension  Auditory Comprehension Overall Auditory Comprehension: Impaired Yes/No Questions: Impaired Complex Questions: 50-74% accurate Commands: Impaired Complex Commands: 50-74% accurate Interfering Components: Pain Visual Recognition/Discrimination Discrimination: Within Function Limits Reading Comprehension Reading Status: Within funtional limits    Expression Expression Primary Mode of Expression: Verbal Verbal Expression Overall Verbal Expression: Appears within functional limits for tasks assessed Written Expression Dominant Hand: Right   Oral / Motor Oral Motor/Sensory  Function Overall Oral Motor/Sensory Function: Impaired Labial ROM: Reduced right Labial Symmetry: Abnormal symmetry right Labial Strength: Reduced Labial Sensation:  Within Functional Limits Lingual ROM: Within Functional Limits Lingual Symmetry: Abnormal symmetry right Lingual Strength: Reduced Lingual Sensation: Within Functional Limits Facial ROM: Reduced right Facial Symmetry: Right droop Facial Strength: Reduced Facial Sensation: Within Functional Limits Velum: Within Functional Limits Mandible: Within Functional Limits Motor Speech Overall Motor Speech: Impaired Respiration: Impaired Level of Impairment: Word Phonation: Breathy Resonance: Within functional limits Articulation: Impaired Level of Impairment: Word Intelligibility: Intelligibility reduced Word: 50-74% accurate Phrase: 50-74% accurate Sentence: 50-74% accurate Conversation: 50-74% accurate Motor Planning: Witnin functional limits Effective Techniques: Slow rate;Increased vocal intensity;Over-articulate;Pause   GO   Gabriel Rainwater MA, CCC-SLP [phone]   Gabriel Rainwater Meryl 09/05/2014, 4:02 PM

## 2014-09-05 NOTE — Progress Notes (Signed)
Subjective: Patient reports feeling well this morning. Says that his right hand weakness is back to his baseline following his 2009 CVA. Speech improved from yesterday but still some dysarthria.   Objective: Vital signs in last 24 hours: Filed Vitals:   09/04/14 2132 09/05/14 0223 09/05/14 0500 09/05/14 0525  BP: 152/79 150/83  142/79  Pulse: 61 62  76  Temp: 98.3 F (36.8 C) 98.2 F (36.8 C)  98.1 F (36.7 C)  TempSrc: Oral Oral  Oral  Resp: 18 18  18   Height:      Weight:   107.412 kg (236 lb 12.8 oz)   SpO2: 98% 99%  99%   Weight change:  No intake or output data in the 24 hours ending 09/05/14 0701   Physical Exam GENERAL- alert, co-operative, appears as stated age, not in any distress. CARDIAC- RRR, no murmurs, rubs or gallops. RESP- Moving equal volumes of air, and clear to auscultation bilaterally, no wheezes or crackles. ABDOMEN- Soft, nontender, no guarding or rebound, no palpable masses or organomegaly, bowel sounds present. NEURO- No obvious Cr N abnormality, strenght upper and lower extremities- 5/5, strength in right hand 4/5, Sensation intact- globally, DTRs- Normal, finger to nose test normal bilat, rapid alternating movement- mild difficulty on the RUE EXTREMITIES- pulse 2+, symmetric, no pedal edema. SKIN- Warm, dry, No rash or lesion. PSYCH- Normal mood and affect, appropriate thought content and speech.  Lab Results: Basic Metabolic Panel:  Recent Labs Lab 09/03/14 1047 09/04/14 0405  NA 138 137  K 4.0 4.5  CL 108 108  CO2 19* 23  GLUCOSE 120* 93  BUN 23* 20  CREATININE 1.79* 1.63*  CALCIUM 9.3 8.9   Liver Function Tests:  Recent Labs Lab 09/03/14 1047  AST 18  ALT 16*  ALKPHOS 90  BILITOT 0.8  PROT 7.3  ALBUMIN 4.1   CBC:  Recent Labs Lab 09/03/14 1047  WBC 9.5  NEUTROABS 7.3  HGB 16.7  HCT 50.1  MCV 90.1  PLT 179   CBG:  Recent Labs Lab 09/03/14 1046  GLUCAP 109*   Hemoglobin A1C: No results for input(s): HGBA1C  in the last 168 hours. Fasting Lipid Panel:  Recent Labs Lab 09/04/14 0405  CHOL 203*  HDL 31*  LDLCALC 151*  TRIG 106  CHOLHDL 6.5   Coagulation:  Recent Labs Lab 09/03/14 1047  LABPROT 14.2  INR 1.08   Urine Drug Screen: Drugs of Abuse     Component Value Date/Time   LABOPIA NEGATIVE 08/01/2009 0507   LABOPIA NONE DETECTED 11/25/2007 2100   COCAINSCRNUR NEGATIVE 08/01/2009 0507   COCAINSCRNUR NONE DETECTED 11/25/2007 2100   LABBENZ * 08/01/2009 0507    POSITIVE (NOTE) Result repeated and verified. Sent for confirmatory testing   Russell DETECTED 11/25/2007 2100   AMPHETMU NEGATIVE 08/01/2009 0507   AMPHETMU NONE DETECTED 11/25/2007 2100   THCU NONE DETECTED 11/25/2007 2100   LABBARB  11/25/2007 2100    NONE DETECTED        DRUG SCREEN FOR MEDICAL PURPOSES ONLY.  IF CONFIRMATION IS NEEDED FOR ANY PURPOSE, NOTIFY LAB WITHIN 5 DAYS.    Alcohol Level:  Recent Labs Lab 09/03/14 1047  ETH <5   Urinalysis:  Recent Labs Lab 09/04/14 0755  COLORURINE YELLOW  LABSPEC 1.012  PHURINE 5.5  GLUCOSEU NEGATIVE  HGBUR NEGATIVE  BILIRUBINUR NEGATIVE  KETONESUR NEGATIVE  PROTEINUR NEGATIVE  UROBILINOGEN 1.0  NITRITE NEGATIVE  LEUKOCYTESUR NEGATIVE   Micro Results: No results found for this  or any previous visit (from the past 240 hour(s)). Studies/Results: Ct Head Wo Contrast  09/03/2014   CLINICAL DATA:  Possible stroke. Bilateral hand weakness. Difficulty with speech and walking.  EXAM: CT HEAD WITHOUT CONTRAST  TECHNIQUE: Contiguous axial images were obtained from the base of the skull through the vertex without intravenous contrast.  COMPARISON:  11/26/2008  FINDINGS: Left frontal lobe low-attenuation appears increased from previous exam, image 23/ series 201. New area of subcortical low attenuation within the posterior left frontal lobe, image 25/series 201. There is no evidence for acute cortical infarct. No intracranial hemorrhage or mass. The  ventricular volumes and sulci appear normal. The paranasal sinuses appear clear. The mastoid air cells are also clear. The calvarium appears intact  IMPRESSION: 1. Progression of left frontal and parietal lobe deep white matter infarcts. 2. No acute intracranial hemorrhage, stroke or mass noted.   Electronically Signed   By: Kerby Moors M.D.   On: 09/03/2014 12:15   Mr Brain Wo Contrast  09/03/2014   CLINICAL DATA:  59 year old male with slurred speech since midnight which began after a fall. Initial encounter.  EXAM: MRI HEAD WITHOUT CONTRAST  TECHNIQUE: Multiplanar, multiecho pulse sequences of the brain and surrounding structures were obtained without intravenous contrast.  COMPARISON:  Head CT without contrast 1159 hours today, and earlier.  FINDINGS: Strict a diffusion extending from the left corona radiata caudally through the external capsule along a segment of about 3 cm (series 8, image 19). Minimal to mild associated T2 and FLAIR hyperintensity. No associated hemorrhage or mass effect.  Major intracranial vascular flow voids are preserved. No contralateral right hemisphere or posterior fossa restricted diffusion.  However, there are multifocal chronic lacunar infarcts in the left hemisphere including both small cortically based lesions (left superior frontal gyrus and parietal lobe series 6, images 22 and 23), and multifocal corona radiata involvement (including at the left frontal horn which was identified on the earlier CT). There is mild hemosiderin associated with that lesion. There is also a chronic lacunar infarct of the right lateral thalamus near the posterior limb of the right internal capsule. Nearby T2 heterogeneity in the right basal ganglia may reflect small perivascular spaces.  No midline shift, mass effect, evidence of mass lesion, ventriculomegaly, extra-axial collection or acute intracranial hemorrhage. Cervicomedullary junction and pituitary are within normal limits. Brainstem and  cerebellum are within normal limits.  Visible internal auditory structures appear normal. Mastoids are clear. Paranasal sinuses are clear. Orbits soft tissues appear normal. Visualized scalp soft tissues are within normal limits. Negative visualized cervical spine. Normal bone marrow signal.  IMPRESSION: 1. Acute on chronic small vessel ischemia. Acute lacunar type infarct extending from the left corona radiata through the left external capsule. No hemorrhage or mass effect. 2. Small chronic white matter and cortically based infarcts throughout the left MCA territory. Small chronic right thalamic lacunar infarct. 3.   Electronically Signed   By: Genevie Ann M.D.   On: 09/03/2014 15:36   Medications: I have reviewed the patient's current medications. Scheduled Meds: . aspirin  81 mg Oral Daily  . atorvastatin  40 mg Oral q1800  . heparin  5,000 Units Subcutaneous 3 times per day   Continuous Infusions:  PRN Meds:.pregabalin Assessment/Plan: Principal Problem:   CVA (cerebral infarction) Active Problems:   Diabetes mellitus   Hyperlipidemia   Hypertension   Smoker   CKD (chronic kidney disease)   OSA (obstructive sleep apnea)   Dysarthria  CVA (cerebral infarction): Speech and  facial droop improved from previous but not resolved. Right hand weakness improved, patient reports its back to his baseline. Neuro wants to get an MRA today to assess his need for Plavix therapy in addition to ASA.  - ECHO: EF 60-65%, mild LVH and grade 1 diastolic dysfunction. See report for full details.  - Carotid Doppler preliminary report shows 1-39% ICA stenosis with antegrade vertebral artery flow - MRA today - Continue telemetry  - PT/OT on board - Neuro checks q4hrs - Decrease ASA to 81 mg qdaily - Appreciate neuro consult - Possible discharge today pending MRA results  CKD: Cr on admission was 1.79. Improved to 1.63 today. Baseline Cr on previousadmissions is around 1.3 but has not had any follow up or  treatment so baseline is uncertain. - D/C IVF, <0.3 improvement in Cr with fluids - UA normal - Check BMP in AM  Diabetes Mellitus Type II: HgA1c 5.9, blood glucose levels have been in the 90-120 range here. Will not start any medications in the hospital. Will need follow up outpatient.   Hyperlipidemia: LDL 151, HDL 31 Total Cholesterol 203.  - Continue atorvastatin 40 mg qdaily  HTN: SBP remains 140-150s overnight. Will start ACE-I today. - Start Lisinopril 10 mg qdaily, will need OP follow up  Smoker: Patient reports he is giving up smoking. Not having any cravings in the hospital. Does wish to have any resources to help quit.   OSA: Patient has reported history of OSA but has never had follow up or had CPAP at night. He did not have any desat events overnight on room air. Sats remain in upper 90s.  - Will need sleep study follow up as outpatient  DVT PPX: Heparin 5000 units SQ q8hr  Code status: FULL CODE  Dispo: Possible D/C today pending MRA  The patient does not have a current PCP (No Pcp Per Patient) and does need an [facility] hospital follow-up appointment after discharge.  The patient does not have transportation limitations that hinder transportation to clinic appointments.   LOS: 1 day   [name], MD 09/05/2014, 7:01 AM

## 2014-09-05 NOTE — Progress Notes (Signed)
Patient is discharged from room 4N02 at this time. Alert and in stable condition. IV site and tele d/c'd. Instructions read to patient and wife and understanding verbalized. Left unit via wheelchair with all belongings at side.

## 2014-09-06 DIAGNOSIS — I6381 Other cerebral infarction due to occlusion or stenosis of small artery: Secondary | ICD-10-CM | POA: Insufficient documentation

## 2014-09-06 NOTE — Discharge Summary (Signed)
Name: [name] MRN: [id number] DOB: May 22, 1955 59 y.o. PCP: No Pcp Per Patient  Date of Admission: 09/03/2014 10:36 AM Date of Discharge: 09/06/2014 Attending Physician: No att. providers found  Discharge Diagnosis: Principal Problem:   CVA (cerebral infarction) Active Problems:   Diabetes mellitus   Hyperlipidemia   Hypertension   Smoker   CKD (chronic kidney disease)   OSA (obstructive sleep apnea)   Dysarthria   Cerebrovascular accident  Discharge Medications:   Medication List    TAKE these medications        aspirin EC 81 MG tablet  Take 81 mg by mouth daily.     atorvastatin 40 MG tablet  Commonly known as:  LIPITOR  Take 1 tablet (40 mg total) by mouth daily at 6 PM.     lisinopril 10 MG tablet  Commonly known as:  PRINIVIL,ZESTRIL  Take 1 tablet (10 mg total) by mouth daily.     pregabalin 75 MG capsule  Commonly known as:  LYRICA  Take 75 mg by mouth at bedtime as needed (for feet).        Disposition and follow-up:   [name] was discharged from [facility] in Good condition.  At the hospital follow up visit please address:  1.  Please address patient's compliance with new medications (ASA, Lisinopril and Atorvastatin) and resolution of symptoms from CVA, dysarthria and right hand weakness. Please address his blood pressure and reassess need for additional medications. Patient will need a referral for a sleep study to address his unclear history of OSA.  2.  Labs / imaging needed at time of follow-up: None  3.  Pending labs/ test needing follow-up: None  Follow-up Appointments:     Follow-up Information    Follow up with Xu,Jindong, MD In 2 months.   Specialty:  Neurology   Why:  Stroke Clinic, Office will call you with appointment date & time   Contact information:   [address] [place] [address] [phone]       Follow up with Juluis Mire, MD On 09/12/2014.   Specialty:   Internal Medicine   Why:  [id number]   Contact information:   [place] [address] [phone]       Discharge Instructions: Discharge Instructions    Ambulatory referral to Neurology    Complete by:  As directed   Dr. Erlinda Hong requests followup in 2 months     Diet - low sodium heart healthy    Complete by:  As directed      Diet - low sodium heart healthy    Complete by:  As directed      Discharge instructions    Complete by:  As directed   We are prescribing you 2 new medications, Atorvastatin and Lisinopril. Please take these medications as instructed on the bottle. We would also like you to take a baby 81 mg aspirin daily.   Please follow up in our clinic, the appointment is scheduled for 1015 on 7/25. Please follow up with Dr. Erlinda Hong in 2 months.     Increase activity slowly    Complete by:  As directed      Increase activity slowly    Complete by:  As directed            Consultations: Treatment Team:  Bartholomew Crews, MD  Stroke Team: Dr. Rosalin Hawking, MD  Procedures Performed:  Ct Head Wo Contrast  09/03/2014  CLINICAL DATA:  Possible stroke. Bilateral hand weakness. Difficulty with speech and walking.  EXAM: CT HEAD WITHOUT CONTRAST  TECHNIQUE: Contiguous axial images were obtained from the base of the skull through the vertex without intravenous contrast.  COMPARISON:  11/26/2008  FINDINGS: Left frontal lobe low-attenuation appears increased from previous exam, image 23/ series 201. New area of subcortical low attenuation within the posterior left frontal lobe, image 25/series 201. There is no evidence for acute cortical infarct. No intracranial hemorrhage or mass. The ventricular volumes and sulci appear normal. The paranasal sinuses appear clear. The mastoid air cells are also clear. The calvarium appears intact  IMPRESSION: 1. Progression of left frontal and parietal lobe deep white matter infarcts. 2. No acute intracranial hemorrhage, stroke or mass noted.    Electronically Signed   By: Kerby Moors M.D.   On: 09/03/2014 12:15   Chad [name] Head Wo Contrast  09/05/2014   CLINICAL DATA:  Stroke  EXAM: MRA HEAD WITHOUT CONTRAST  TECHNIQUE: Angiographic images of the Circle of Willis were obtained using MRA technique without intravenous contrast.  COMPARISON:  MRI head 09/03/2014  FINDINGS: Both vertebral arteries are widely patent. PICA patent bilaterally. Basilar widely patent. Superior cerebellar artery patent bilaterally. Left posterior cerebral artery widely patent. Fetal origin of the left posterior cerebral artery with hypoplastic left P1 segment. There is disease in the proximal left posterior cerebral artery which occludes. No acute infarct in this territory on the recent MRI.  Cavernous carotid widely patent. Anterior and middle cerebral arteries patent without significant stenosis  Negative for cerebral aneurysm  IMPRESSION: Fetal origin left posterior cerebral artery which is occluded in the midportion.  Anterior circulation shows no significant stenosis.   Electronically Signed   By: Franchot Gallo M.D.   On: 09/05/2014 13:58   Chad Brain Wo Contrast  09/03/2014   CLINICAL DATA:  59 year old male with slurred speech since midnight which began after a fall. Initial encounter.  EXAM: MRI HEAD WITHOUT CONTRAST  TECHNIQUE: Multiplanar, multiecho pulse sequences of the brain and surrounding structures were obtained without intravenous contrast.  COMPARISON:  Head CT without contrast 1159 hours today, and earlier.  FINDINGS: Strict a diffusion extending from the left corona radiata caudally through the external capsule along a segment of about 3 cm (series 8, image 19). Minimal to mild associated T2 and FLAIR hyperintensity. No associated hemorrhage or mass effect.  Major intracranial vascular flow voids are preserved. No contralateral right hemisphere or posterior fossa restricted diffusion.  However, there are multifocal chronic lacunar infarcts in the left  hemisphere including both small cortically based lesions (left superior frontal gyrus and parietal lobe series 6, images 22 and 23), and multifocal corona radiata involvement (including at the left frontal horn which was identified on the earlier CT). There is mild hemosiderin associated with that lesion. There is also a chronic lacunar infarct of the right lateral thalamus near the posterior limb of the right internal capsule. Nearby T2 heterogeneity in the right basal ganglia may reflect small perivascular spaces.  No midline shift, mass effect, evidence of mass lesion, ventriculomegaly, extra-axial collection or acute intracranial hemorrhage. Cervicomedullary junction and pituitary are within normal limits. Brainstem and cerebellum are within normal limits.  Visible internal auditory structures appear normal. Mastoids are clear. Paranasal sinuses are clear. Orbits soft tissues appear normal. Visualized scalp soft tissues are within normal limits. Negative visualized cervical spine. Normal bone marrow signal.  IMPRESSION: 1. Acute on chronic small vessel ischemia. Acute lacunar type infarct  extending from the left corona radiata through the left external capsule. No hemorrhage or mass effect. 2. Small chronic white matter and cortically based infarcts throughout the left MCA territory. Small chronic right thalamic lacunar infarct. 3.   Electronically Signed   By: Genevie Ann M.D.   On: 09/03/2014 15:36   2D Echo:  Study Conclusions  - Left ventricle: The cavity size was normal. Wall thickness was increased in a pattern of mild LVH. Systolic function was normal. The estimated ejection fraction was in the range of 60% to 65%. Wall motion was normal; there were no regional wall motion abnormalities. Doppler parameters are consistent with abnormal left ventricular relaxation (grade 1 diastolic dysfunction). The E/e&' ratio is >15, suggesting elevated LV filling pressure. - Aortic valve: Trileaflet.  The left coronary cusp appears to be immobile. There was no stenosis. There was no regurgitation. - Mitral valve: Calcified annulus. Calcified chordae. Trivial Chad. - Left atrium: Moderately dilated at 42 ml/m2. - Right atrium: Mildly dilated at 22 cm2. - Inferior vena cava: The vessel was normal in size. The respirophasic diameter changes were in the normal range (>= 50%), consistent with normal central venous pressure.  Impressions:  - LVEF 60-65%, mild LVH, diastolic dysfunction, elevated LV filling pressure, immobile left coronary cusp of the aortic valve (without significant stenosis), MAC with trivial Chad, moderate LAE, mild RAE, normal IVC.  Admission HPI: 59 year old white male with a PMH of DM type 2, HLD, HTN, and current smoker presented to the ED with slurred speech and right hand weakness. Last night at around midnight he developed slurred speech as well as lost his balance fell into a door. He went to bed and woke up this morning with no improvement in his speech. He continued with his daily routine until his wife finally got him to come to the ED for evaluation. He has slurred speech with right sided mouth drooping as well as worsened right hand weakness from baseline.   He had a stroke back in 2009 with residual right hand weakness. At the time his Left Carotid Artery was >80% stenosed and a stent was placed. His PCP retired last year and he has not established with a new PCP and stopped taking his medications. He does report taking Lyrica occasionally for neuropathy but has not had a prescription filled in >9 months.   CT in the ED showed progression of left frontal and parietal lobe deep white matter infarcts but no acute hemorrhage. MRI showed acute lacunar infarct extending from left corona radiata through the left external capsule as well as chronic infarcts throughout the left MCA territory and right thalamic lacunar region.    Hospital Course by problem  list: Principal Problem:   CVA (cerebral infarction) Active Problems:   Diabetes mellitus   Hyperlipidemia   Hypertension   Smoker   CKD (chronic kidney disease)   OSA (obstructive sleep apnea)   Dysarthria   Cerebrovascular accident   Acute Left Lacunar Stroke: Chad. Langston presented to the ED with >12hr-history of slurred speech and worsening of previous right hand weakness. At CT showed no acute hemorrhage, and an MRI showed an acute left lacunar stroke extending from the left corona radiate through left external capsule, as well as multiple chronic infarcts. Chad. Voland presented too late to be given tPA. An EKG showed normal sinus rhythm, and troponin was negative. Chad. Valdez received aspirin 325 mg and neurology was consulted. The following day an ECHO was performed showing LVEF  60-65%, mild LVH and grade 1 diastolic dysfunction. Carotid arteries doppler showed 1-39% ICA stenosis with antegrade vertebral artery flow on preliminary report, final results still pending. Neurology wanted an MRA for further evaluation to determine the need to add Plavix to ASA therapy. MRA showed fetal origin left posterior cerebral artery which is occluded in the midportion. As his CVA was in the anterior circulation and MRA showed no large occulusions in the anterior circulation, neurology did not recommend starting Plavix. Chad. Kochan was discharged on aspirin 81 mg daily, atorvastatin 40 mg daily and lisinopril 10 mg daily. Follow up in our clinic in a week and with Dr. Erlinda Hong in 2 months.    Hypertension: History of chronic hypertension but with not on any home medication regimen. Blood pressure was elevated on presentation (172/83) but decreased to the 150's/90's by the next morning. He was allowed permissive hypertension for 24 hours after presentation. We started him on lisinopril 10 mg. Will need follow up with PCP outpatient.   Chronic Kidney Disease: History of CKD but has never follow up for  treatment. Creatinine was elevated to 1.79, with a previous baseline of 1.4 (last checked in 2012). Overnight we started IV fluids with NS 100 mL/hr. The next morning a urinalysis was normal and a serum creatinine decreased to 1.63, and IV fluids were discontinued. Could be a progression of CKD with no treatment and this may be his new baseline. Started lisinopril 10 mg.   Hyperlipidemia: Chad. Karapetyan has a chronic history of HLD but was not on any home medications for this condition. A lipid panel showed mild increase in total cholesterol (203), decreased HDL (31), and elevated LDL (151), so he was started on atorvastatin 40mg  daily, on which he is discharged.  Diabetes Mellitus Type 2: Has a chronic history of DMT2 controlled previously with lifestyle modification. An Hgb A1c was 5.9 and blood glucose levels ranged 90-120 during hospitalization. No medications were started, will need long term monitoring outpatient.  Obstructive Sleep Apnea: Has a history of OSA but was not using a CPAP. Overnight oxygen saturations dropped to 89, so he was given 2L of oxygen via nasal cannula, with improvements of O2 sat to upper 90's. The following night he did not desat while on room air. Will need follow up outpatient with a sleep study to assess need for CPAP at home.   Discharge Vitals:   BP 138/81 mmHg  Pulse 60  Temp(Src) 98.1 F (36.7 C) (Oral)  Resp 18  Ht 5\' 11"  (1.803 m)  Wt 107.412 kg (236 lb 12.8 oz)  BMI 33.04 kg/m2  SpO2 97%  Discharge Labs:  No results found for this or any previous visit (from the past 24 hour(s)).  Signed: [name], MD 09/06/2014, 10:19 AM

## 2014-09-08 ENCOUNTER — Encounter: Payer: Self-pay | Admitting: Internal Medicine

## 2014-09-08 ENCOUNTER — Ambulatory Visit (INDEPENDENT_AMBULATORY_CARE_PROVIDER_SITE_OTHER): Payer: No Typology Code available for payment source | Admitting: Internal Medicine

## 2014-09-08 VITALS — BP 127/66 | HR 74 | Temp 98.2°F | Ht 72.0 in | Wt 240.1 lb

## 2014-09-08 DIAGNOSIS — E669 Obesity, unspecified: Secondary | ICD-10-CM

## 2014-09-08 DIAGNOSIS — Z6832 Body mass index (BMI) 32.0-32.9, adult: Secondary | ICD-10-CM

## 2014-09-08 DIAGNOSIS — Z0000 Encounter for general adult medical examination without abnormal findings: Secondary | ICD-10-CM | POA: Insufficient documentation

## 2014-09-08 DIAGNOSIS — Z720 Tobacco use: Secondary | ICD-10-CM

## 2014-09-08 DIAGNOSIS — I69322 Dysarthria following cerebral infarction: Secondary | ICD-10-CM | POA: Diagnosis not present

## 2014-09-08 DIAGNOSIS — E1142 Type 2 diabetes mellitus with diabetic polyneuropathy: Secondary | ICD-10-CM

## 2014-09-08 DIAGNOSIS — E1122 Type 2 diabetes mellitus with diabetic chronic kidney disease: Secondary | ICD-10-CM | POA: Diagnosis not present

## 2014-09-08 DIAGNOSIS — R911 Solitary pulmonary nodule: Secondary | ICD-10-CM | POA: Insufficient documentation

## 2014-09-08 DIAGNOSIS — N183 Chronic kidney disease, stage 3 unspecified: Secondary | ICD-10-CM

## 2014-09-08 DIAGNOSIS — F329 Major depressive disorder, single episode, unspecified: Secondary | ICD-10-CM

## 2014-09-08 DIAGNOSIS — F32A Depression, unspecified: Secondary | ICD-10-CM | POA: Insufficient documentation

## 2014-09-08 DIAGNOSIS — I129 Hypertensive chronic kidney disease with stage 1 through stage 4 chronic kidney disease, or unspecified chronic kidney disease: Secondary | ICD-10-CM

## 2014-09-08 DIAGNOSIS — G4733 Obstructive sleep apnea (adult) (pediatric): Secondary | ICD-10-CM

## 2014-09-08 DIAGNOSIS — I6381 Other cerebral infarction due to occlusion or stenosis of small artery: Secondary | ICD-10-CM

## 2014-09-08 DIAGNOSIS — N62 Hypertrophy of breast: Secondary | ICD-10-CM

## 2014-09-08 DIAGNOSIS — E785 Hyperlipidemia, unspecified: Secondary | ICD-10-CM

## 2014-09-08 DIAGNOSIS — I5189 Other ill-defined heart diseases: Secondary | ICD-10-CM | POA: Insufficient documentation

## 2014-09-08 DIAGNOSIS — I10 Essential (primary) hypertension: Secondary | ICD-10-CM

## 2014-09-08 DIAGNOSIS — F17210 Nicotine dependence, cigarettes, uncomplicated: Secondary | ICD-10-CM

## 2014-09-08 MED ORDER — LISINOPRIL 10 MG PO TABS: 10.0000 mg | ORAL_TABLET | Freq: Every day | ORAL | Status: DC

## 2014-09-08 MED ORDER — CITALOPRAM HYDROBROMIDE 40 MG PO TABS: 40.0000 mg | ORAL_TABLET | Freq: Every day | ORAL | Status: DC

## 2014-09-08 MED ORDER — PREGABALIN 75 MG PO CAPS: 75.0000 mg | ORAL_CAPSULE | Freq: Every evening | ORAL | Status: DC | PRN

## 2014-09-08 MED ORDER — ATORVASTATIN CALCIUM 40 MG PO TABS: 40.0000 mg | ORAL_TABLET | Freq: Every day | ORAL | Status: DC

## 2014-09-08 NOTE — Assessment & Plan Note (Signed)
Assessment: Pt with history of uncontrolled hypertension due to noncompliance now compliant with one-class (ACEi) anti-hypertensive therapy who presents with blood pressure of 127/66.   Plan:  -BP 127/66 at goal <140/90 -Continue lisinopril 10 mg daily, consider adding HCTZ if becomes uncontrolled -Last BMP on 09/04/14 with CKD Stage 3 with Cr 1.63 and GFR 46

## 2014-09-08 NOTE — Assessment & Plan Note (Signed)
Assessment: Pt with last lipid panel on 09/04/14 with LDL of 151 compliant with high-intensity statin therapy with calculated 10-yr ASCD risk >7.5% with recommendations to continue moderate to high intensity statin therapy.   Plan:  -Continue atorvastatin 40 mg daily  -Repeat lipid panel in 3 months to assess for response -Last CMP on 09/03/14 with normal liver function -Continue to monitor for myalgias

## 2014-09-08 NOTE — Patient Instructions (Addendum)
-  Please follow-up with Dr. Erlinda Hong in September, will schedule you for your appointment and call you  -Start taking celexa 20 mg daily for 1 week then 40 mg daily for mood -Will call you for the CT scan and speech therapy -Will check your eyes, labs, and shots next time   -I refilled your medications  -Please come back in 8-12 weeks for follow-up, very nice meeting you!  General Instructions:  General Instructions:   Thank you for bringing your medicines today. This helps [place] keep you safe from mistakes.   Progress Toward Treatment Goals:  No flowsheet data found.  Self Care Goals & Plans:  No flowsheet data found.  No flowsheet data found.   Care Management & Community Referrals:  No flowsheet data found.

## 2014-09-08 NOTE — Assessment & Plan Note (Signed)
Assessment: Pt with history of depression previously well-controlled on celexa who presents with recurrent symptoms in setting of recent stroke with no suicide ideation.   Plan: -Prescribe celexa 40 mg daily, instructed to take 20 mg daily for 1 week and then increase to 40 mg daily  -Pt to follow-up in 8-12 weeks

## 2014-09-08 NOTE — Assessment & Plan Note (Signed)
-  Pt would like to discuss screening colonoscopy at next visit  -Pt declined tdap vaccination today, would like to have it administered at next visit -Pt reports having pneumococcal vaccination in the past   -Obtain screening HIV Ab and HCV (born b/w 1943-65) at next visit

## 2014-09-08 NOTE — Assessment & Plan Note (Addendum)
Assessment: Pt with last A1c of 5.9 on 09/04/14 not on oral hypoglycemic therapy with occasional symptomatic hypoglycemia who presents with no symptoms of hyper/hypoglycemia.   Plan:  -A1c 5.9 at goal <7, continue lifestyle modification. Pt encouraged to not skip meals to avoid symptomatic hypoglycemia.  -BP 127/66 at goal <140/90, continue lisinopril 10 mg daily -LDL 151 not at goal <100, continue atorvastatin 40 mg daily  -Refill lyrica 75 mg daily PRN bedtime for chronic peripheral neuropathy  -Perform annual eye exam at next visit, declined today -Perform annual foot exam today  -Obtain annual urine microalbumin at next visit, pt could not provide urine sample  -Obtain 25-OH vitamin D, PTH, and phosphorus at next visit in setting of CKD Stage 3 -BMI 32.56 not at goal <25, encourage weight loss

## 2014-09-08 NOTE — Assessment & Plan Note (Signed)
Assessment: Pt with severe OSA on last sleep study in 2012 no longer on CPAP or Bipap therapy who presents with persistent symptoms per wife.   Plan:  -Pt declines sleep study at this time

## 2014-09-08 NOTE — Assessment & Plan Note (Addendum)
Assessment: Pt is a chronic (>30 years) cigarette smoker with current 1 pack per 3 day use who is trying to cut back.    Plan:  -Pt encouraged on tobacco cessation -Consider nicotine patch and gum at next visit, declined wellbutrin  -Obtain CT chest w/o contrast to follow-up solitary pulmonary nodule   -Consider pulmonary function testing

## 2014-09-08 NOTE — Progress Notes (Signed)
Patient ID: [name], male   DOB: 10/12/1955, 59 y.o.   MRN: [id number]    Subjective:   Patient ID: [name] male   DOB: December 07, 1955 59 y.o.   MRN: [id number]  HPI: [name] is a 59 y.o. very pleasant man with past medical history of CVA (2009 & 2016), hypertension, hyperlipidemia, non-insulin dependent Type 2 DM, grade 1 diastolic dysfunction, severe OSA, depression, and tobacco use who presents for hospital follow-up visit. He is accompanied by his wife.  He was hospitalized from 7/16-19 for left lacunar stroke thought to be due to small vessel disease in setting of noncompliance with aspirin, anti-hypertensive, lipid lowering therapy, and tobacco use. He had previous stroke in 2009 with residual right hand weakness. He was instructed to take aspirin 81 mg daily, atorvastatin 40 mg daily, and lisinopril 10 mg daily which he reports compliance with. He continues to have dysarthria with mild dysphagia and was to have speech therapy follow-up as outpatient but has not heard yet from anyone regarding this. He denies paresis, paraesthesias, vertigo, vision change, headache, or ataxia. He is to have neurology follow-up with Dr. Erlinda Hong in 2 months.  He has history of non-insulin Type 2 DM with last A1c of 5.9 on 09/04/14. He is not currently on any oral hypoglycemic agents but used to be on metformin. He does not check his blood sugar at home. He reports occasional hypoglycemia that improves with eating. He denies polydipsia, polyuria, polyphagia, blurry vision, or foot injury/ulceration. He takes lyrica as needed at bedtime for chronic peripheral neuropathy. He is not had a recent annual eye exam. He is trying to follow a healthy diet and exercise. Per his wife he has lost a lot of weight recently.   He has history of depression and was previously on celexa 40 mg daily which helped stabilized his mood. Per his wife he has been depressed with crying episodes and loss of interest  in activities. He has been lately frustrated with his speech difficulty after recent stroke. He denies manic episodes or prior history of bipolar disorder. He was on wellbutrin previously which did not agree with him.   He had CT chest imaging on 07/31/09 in the ED which incidental finding of a 5 mm left upper lobe pulmonary nodule and was to have follow-up in 6-12 months but never did because he was not aware of this. He currently smokes 1 pack of cigarettes every 3 days which he has cut back from 1 pack per day for approximately 30 years. He has never had pulmonary function testing. He denies chronic cough, wheezing, or dyspnea.  He has history of OSA with last sleep study in 2012 with severe disease but did not tolerate previous CPAP or BiPAP. He declines sleep study.    He reports having possible pneumococcal vaccination in the past. He would like to have tdap at next visit. He has never had screening colonoscopy.    Past Medical History  Diagnosis Date  . CVA (cerebral vascular accident) 2009  . Hypertension   . Hyperlipemia   . Diabetes type 2, controlled     controlled by lifestyle modification  . OSA (obstructive sleep apnea)    Current Outpatient Prescriptions  Medication Sig Dispense Refill  . aspirin EC 81 MG tablet Take 81 mg by mouth daily.    Marland Kitchen atorvastatin (LIPITOR) 40 MG tablet Take 1 tablet (40 mg total) by mouth daily at 6 PM. 30 tablet 0  . lisinopril (PRINIVIL,ZESTRIL)  10 MG tablet Take 1 tablet (10 mg total) by mouth daily. 30 tablet 0  . pregabalin (LYRICA) 75 MG capsule Take 75 mg by mouth at bedtime as needed (for feet).     No current facility-administered medications for this visit.   Family History  Problem Relation Age of Onset  . Stroke Father   . Hypertension Mother   . Hypertension Father   . Hypertension Sister   . Hypertension Brother   . Diabetes Mother   . Diabetes Father   . Diabetes Sister   . Diabetes Brother   . Heart failure Mother   .  Heart failure Father    History   Social History  . Marital Status: Married    Spouse Name: N/A  . Number of Children: N/A  . Years of Education: N/A   Social History Main Topics  . Smoking status: Current Every Day Smoker -- 1.00 packs/day for 30 years    Types: Cigarettes  . Smokeless tobacco: Not on file     Comment: 1PACK EVERY 3 DAYS  . Alcohol Use: 0.0 oz/week    0 Standard drinks or equivalent per week     Comment: 2-3 drinks per year  . Drug Use: No  . Sexual Activity: Not on file   Other Topics Concern  . None   Social History Narrative   Lives alone in [place], [place] (2006)   Review of Systems: Review of Systems  Constitutional: Positive for weight loss. Negative for malaise/fatigue.  HENT: Negative for sore throat.        Mild dysphagia  Eyes: Negative for blurred vision.  Respiratory: Negative for cough, shortness of breath and wheezing.   Cardiovascular: Negative for chest pain and leg swelling.  Gastrointestinal: Positive for diarrhea (loose stools). Negative for nausea, vomiting, abdominal pain, constipation and blood in stool.  Genitourinary: Negative for dysuria, urgency, frequency and hematuria.  Musculoskeletal: Negative for myalgias.  Neurological: Positive for sensory change, speech change (dysarthria after recent stroke) and focal weakness (right hand ). Negative for dizziness and headaches.  Endo/Heme/Allergies: Negative for polydipsia.  Psychiatric/Behavioral: Positive for depression. Negative for suicidal ideas.    Objective:  Physical Exam: Filed Vitals:   09/08/14 1448  BP: 127/66  Pulse: 74  Temp: 98.2 F (36.8 C)  TempSrc: Oral  Height: 6' (1.829 m)  Weight: 240 lb 1.6 oz (108.909 kg)  SpO2: 100%    Physical Exam  Constitutional: He is oriented to person, place, and time. He appears well-developed and well-nourished. No distress.  HENT:  Head: Normocephalic and atraumatic.  Right Ear: External ear normal.  Left Ear: External ear  normal.  Nose: Nose normal.  Mouth/Throat: Oropharynx is clear and moist. No oropharyngeal exudate.  Eyes: Conjunctivae and EOM are normal. Pupils are equal, round, and reactive to light. Right eye exhibits no discharge. Left eye exhibits no discharge. No scleral icterus.  Neck: Normal range of motion. Neck supple.  Cardiovascular: Normal rate, regular rhythm and normal heart sounds.   No murmur heard. Pulmonary/Chest: Effort normal. No respiratory distress. He has no wheezes. He has no rales.  Decreased breath sounds  Abdominal: Soft. Bowel sounds are normal. He exhibits no distension. There is no tenderness. There is no rebound and no guarding.  Musculoskeletal: Normal range of motion. He exhibits no edema or tenderness.  Neurological: He is alert and oriented to person, place, and time. No cranial nerve deficit. Coordination normal.  Dysarthria, hard to understand speech. Normal 5/5 muscle strength. Normal  sensation to light touch of extremities.   Skin: Skin is warm and dry. No rash noted. He is not diaphoretic. No erythema. No pallor.  Psychiatric: His behavior is normal. Judgment and thought content normal.  Crying at times during interview. Flat affect.    Assessment & Plan:   Please see problem list for problem-based assessment and plan

## 2014-09-08 NOTE — Assessment & Plan Note (Addendum)
Assessment: Pt with recurrent small vessel disease ischemic stroke July of 2016 compliant with medical therapy who presents with residual dysarthria.   Plan:  -Continue aspirin 81 mg daily for secondary prevention -BP 127/66 at goal <140/90, continue lisinopril 10 mg daily  -LDL 151 not at goal <100, continue atorvastatin 40 mg daily  -A1c 5.9 at goal <7, continue lifestyle modification -Refer to speech therapy for residual dysarthria -Encourage tobacco cessation  -Called [facility] and made pt appointment for November 07, 2014 at 2:00 PM with Dr. Erlinda Hong for neurology follow-up

## 2014-09-08 NOTE — Assessment & Plan Note (Addendum)
Assessment: Pt is a chronic smoker with evidence of 5 mm LUL solitary pulmonary nodule incidentally found on CT imaging on 07/31/09 in the ED with no subsequent follow-up.   Plan:  -Obtain CT chest w/o contrast  -Pt encouraged on smoking cessation  ADDENDUM on 10/04/14: CT chest on 10/03/13 with benign small pulmonary nodules with no further follow-up recommended.

## 2014-09-09 NOTE — Progress Notes (Signed)
Medicine attending: Medical history, presenting problems, physical findings, and medications, reviewed with Dr  a Juluis Mire and I concur with her evaluation and management plan.

## 2014-09-12 ENCOUNTER — Ambulatory Visit: Payer: No Typology Code available for payment source | Admitting: Internal Medicine

## 2014-09-28 ENCOUNTER — Telehealth: Payer: Self-pay | Admitting: *Deleted

## 2014-09-28 NOTE — Telephone Encounter (Signed)
CALLED PATIENT AND SPOKE WITH WIFE DONNA. APPOINTMENT GIVEN FOR CT/ AUGUST 16.016 @ 2:00PM TO ARRIVE 1:45PM AT [facility]. PATIENT IS NOT TO EAT 4 HOURS PRIOR TO THIS APPOINTMENT. WIFE WAS INSTRUCTED TO CALL [facility] [phone] IF NEEDING TO CANCEL OR CHANGE THIS APPOINTMENT.

## 2014-10-04 ENCOUNTER — Ambulatory Visit (HOSPITAL_COMMUNITY)
Admission: RE | Admit: 2014-10-04 | Discharge: 2014-10-04 | Disposition: A | Discharge status: Home / Self Care | Payer: No Typology Code available for payment source | Source: Ambulatory Visit | Attending: Internal Medicine | Admitting: Internal Medicine

## 2014-10-04 DIAGNOSIS — J432 Centrilobular emphysema: Secondary | ICD-10-CM | POA: Diagnosis not present

## 2014-10-04 DIAGNOSIS — E119 Type 2 diabetes mellitus without complications: Secondary | ICD-10-CM | POA: Diagnosis not present

## 2014-10-04 DIAGNOSIS — R918 Other nonspecific abnormal finding of lung field: Secondary | ICD-10-CM | POA: Insufficient documentation

## 2014-10-04 DIAGNOSIS — I2510 Atherosclerotic heart disease of native coronary artery without angina pectoris: Secondary | ICD-10-CM | POA: Insufficient documentation

## 2014-10-04 DIAGNOSIS — N62 Hypertrophy of breast: Secondary | ICD-10-CM | POA: Insufficient documentation

## 2014-10-04 DIAGNOSIS — G4733 Obstructive sleep apnea (adult) (pediatric): Secondary | ICD-10-CM | POA: Diagnosis not present

## 2014-10-04 DIAGNOSIS — I2721 Secondary pulmonary arterial hypertension: Secondary | ICD-10-CM

## 2014-10-04 DIAGNOSIS — R911 Solitary pulmonary nodule: Secondary | ICD-10-CM

## 2014-10-07 DIAGNOSIS — I2721 Secondary pulmonary arterial hypertension: Secondary | ICD-10-CM | POA: Insufficient documentation

## 2014-10-07 DIAGNOSIS — I2510 Atherosclerotic heart disease of native coronary artery without angina pectoris: Secondary | ICD-10-CM | POA: Insufficient documentation

## 2014-10-07 DIAGNOSIS — N62 Hypertrophy of breast: Secondary | ICD-10-CM | POA: Insufficient documentation

## 2014-10-11 ENCOUNTER — Ambulatory Visit: Payer: No Typology Code available for payment source | Attending: Internal Medicine

## 2014-10-11 DIAGNOSIS — I69922 Dysarthria following unspecified cerebrovascular disease: Secondary | ICD-10-CM | POA: Insufficient documentation

## 2014-10-11 DIAGNOSIS — IMO0002 Reserved for concepts with insufficient information to code with codable children: Secondary | ICD-10-CM

## 2014-10-11 NOTE — Therapy (Signed)
[facility] [address] [place], [place], [address] Phone: [phone]   Fax:  [phone]  Speech Language Pathology Evaluation  Patient Details  Name: [name] MRN: [id number] Date of Birth: 07-01-1955 Referring Provider:  Juluis Mire, MD  Encounter Date: 10/11/2014      End of Session - 10/11/14 1703    Visit Number 1   Number of Visits 6   Date for SLP Re-Evaluation 12/09/14   SLP Start Time 1404   SLP Stop Time  1446   SLP Time Calculation (min) 42 min   Activity Tolerance Patient tolerated treatment well      Past Medical History  Diagnosis Date  . CVA (cerebral vascular accident) 2009  . Hypertension   . Hyperlipemia   . Diabetes type 2, controlled     controlled by lifestyle modification  . OSA (obstructive sleep apnea)     Past Surgical History  Procedure Laterality Date  . Appendectomy    . Knee surgery      There were no vitals filed for this visit.  Visit Diagnosis: Dysarthria due to cerebrovascular accident      Subjective Assessment - 10/11/14 1411    Subjective "Speech, more than likely." (re: SLP, what's different after your stroke?)   Patient is accompained by: Family member  Wife, Butch Penny            SLP Evaluation [facility] - 10/11/14 1411    SLP Visit Information   SLP Received On 10/11/14   Onset Date 09-03-14   Medical Diagnosis CVA   Subjective   Patient/Family Stated Goal "Sherron Monday learn to talk better"   General Information   Other Pertinent Information Previous CVA with residual rt hand weakness "about the same or a little worse" (per wife) after current CVA   Prior Functional Status   Cognitive/Linguistic Baseline Within functional limits   Type of [place] With Spouse   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   Cognition   Overall Cognitive Status Within Functional Limits for tasks assessed   Auditory Comprehension   Overall Auditory Comprehension  Appears within functional limits for tasks assessed   Verbal Expression   Overall Verbal Expression Appears within functional limits for tasks assessed   Oral Motor/Sensory Function   Overall Oral Motor/Sensory Function Impaired   Labial ROM Within Functional Limits   Labial Strength Reduced Right   Labial Sensation Within Functional Limits   Labial Coordination Reduced   Lingual Symmetry Abnormal symmetry right  minimal   Lingual Strength Reduced Right   Lingual Sensation Within Functional Limits   Lingual Coordination WFL   Facial ROM Within Functional Limits   Facial Symmetry Right drooping eyelid;Right droop  mild   Velum Within Functional Limits   Motor Speech   Overall Motor Speech Impaired   Phonation Low vocal intensity  as reported by wife   Articulation Impaired   Level of Impairment Sentence   Intelligibility Intelligible   Effective Techniques Slow rate;Over-articulate     After eval tasks, SLP developed a HEP for pt to utilize in order to improve speech clarity via using speech compensations, as well as to strengthen oral articulators. Pt req'd usual mod A from SLP for using speech clarity compensations                    SLP Education - 10/11/14 1703    Education provided Yes   Education Details HEP, speech compensations,  need for pt to perform HEP at home due to limited visits (high copay)   Person(s) Educated Patient;Spouse   Methods Explanation;Demonstration;Verbal cues;Handout   Comprehension Verbalized understanding;Returned demonstration;Verbal cues required;Need further instruction            SLP Long Term Goals - 10/11/14 1707    SLP LONG TERM GOAL #1   Title pt will perform HEP for compensatory speech measures with rare min A   Time 4   Period Weeks   Status New   SLP LONG TERM GOAL #2   Title pt will use compensations for speech clarity with occasional min nomverbal cues   Time 4   Period Weeks   Status New           Plan - 10/11/14 1704    Clinical Impression Statement Pt presents with mild dysarthria characterized by rushes of speech and imprecise consonants due to CVA 09-03-14. Skilled ST needed to ipmrove oral muscle strength to increase pt clarity of speech, especially when fatigued.   Speech Therapy Frequency 1x /week  due to financial reasons, although SLP prefers x2/week   Duration 4 weeks  or 4 visits   Treatment/Interventions Oral motor exercises;SLP instruction and feedback;Compensatory strategies;Patient/family education   Potential to Achieve Goals Good   SLP Home Exercise Plan provided today   Consulted and Agree with Plan of Care Patient        Problem List Patient Active Problem List   Diagnosis Date Noted  . Pulmonary artery hypertension 10/07/2014  . Gynecomastia 10/07/2014  . Coronary artery calcification seen on CT scan 10/07/2014  . Solitary pulmonary nodule 09/08/2014  . Depression 09/08/2014  . Healthcare maintenance 09/08/2014  . Diastolic dysfunction 49/44/9675  . Left sided lacunar stroke   . Diabetes mellitus with stage 3 chronic kidney disease 09/03/2014  . Hyperlipidemia 09/03/2014  . Hypertension 09/03/2014  . Tobacco use 09/03/2014  . OSA (obstructive sleep apnea) 09/03/2014    [facility] 10/11/2014, 5:11 PM  [facility] [address] [place], [place], [address] Phone: [phone]   Fax:  [phone]

## 2014-10-11 NOTE — Patient Instructions (Addendum)
  Speech Exercises  Repeat 2 times, 2-3 times a day  Call the cat "Buttercup" A calendar of [place], [place] Four floors to cover Yellow oil ointment Fellow lovers of felines Catastrophe in [place]' plums The church's chimes chimed Telling time 'til eleven Five valve levers Keep the gate closed Go see that guy Fat cows give milk [employer] Gophers Fat frogs flip freely Kohl's into bed Get that game to [employer] Thick thistles stick together Cinnamon aluminum linoleum Black bugs blood Lovely lemon linament Red leather, yellow leather  Big grocery buggy    Purple baby carriage [place]per copper coffee pot Ripe purple cabbage Three free throws Timmy tried to tackle him  [employer] dipped the dessert  [employer] that [employer] of [employer] shrink, shells shouldn't [place] Take the tackle box File the flash message Give me five flapjacks Fundamental relatives Dye the pets purple Talking [place] time after time Dark chocolate chunks Political landscape of the kingdom Estate manager/land agent genius We played yo-yos yesterday

## 2014-10-18 ENCOUNTER — Telehealth: Payer: Self-pay | Admitting: Internal Medicine

## 2014-10-18 ENCOUNTER — Ambulatory Visit: Payer: No Typology Code available for payment source

## 2014-10-18 DIAGNOSIS — I69922 Dysarthria following unspecified cerebrovascular disease: Secondary | ICD-10-CM | POA: Diagnosis not present

## 2014-10-18 DIAGNOSIS — IMO0002 Reserved for concepts with insufficient information to code with codable children: Secondary | ICD-10-CM

## 2014-10-18 NOTE — Telephone Encounter (Signed)
Call to patient to confirm appointment for 10/19/14 at 1:15 lmtcb

## 2014-10-18 NOTE — Therapy (Signed)
[facility] [address] [place], [place], [address] Phone: [phone]   Fax:  [phone]  Speech Language Pathology Treatment  Patient Details  Name: [name] MRN: [id number] Date of Birth: 07/20/1955 Referring Provider:  [name], MD  Encounter Date: 10/18/2014      End of Session - 10/18/14 1529    Visit Number 2   Number of Visits 6   Date for SLP Re-Evaluation 12/09/14   SLP Start Time 1448   SLP Stop Time  1523   SLP Time Calculation (min) 35 min   Activity Tolerance --      Past Medical History  Diagnosis Date  . CVA (cerebral vascular accident) 2009  . Hypertension   . Hyperlipemia   . Diabetes type 2, controlled     controlled by lifestyle modification  . OSA (obstructive sleep apnea)     Past Surgical History  Procedure Laterality Date  . Appendectomy    . Knee surgery      There were no vitals filed for this visit.  Visit Diagnosis: Dysarthria due to cerebrovascular accident      Subjective Assessment - 10/18/14 1451    Subjective "They (HEP) helped a lot."   Patient is accompained by: Family member               ADULT SLP TREATMENT - 10/18/14 1452    General Information   Behavior/Cognition Pleasant mood;Cooperative;Alert   Treatment Provided   Treatment provided Cognitive-Linquistic   Pain Assessment   Pain Assessment No/denies pain   Cognitive-Linquistic Treatment   Treatment focused on Dysarthria   Skilled Treatment Pt performed HEP for exaggeration and strength with excellent success, as pt read 11 sentences with very little distortion of phonemes. He read CVA education out loud and req'd rare min A for slowing rate and opening mouth (oral movement exaggeration) . In conversation following reading, pt maintained slower rate and exaggeration with rare min A. Pt reports being pleased with progress thus far and would like to decr to x1 every other week.    Assessment / Recommendations / Plan   Plan Continue with current plan of care   Progression Toward Goals   Progression toward goals Progressing toward goals          SLP Education - 10/18/14 1455    Education provided Yes   Education Details CVA Ed   [employer]) Educated Patient;Spouse   Methods Explanation;Handout   Comprehension Verbalized understanding            SLP Long Term Goals - 10/18/14 1532    SLP LONG TERM GOAL #1   Title pt will perform HEP for compensatory speech measures with rare min A   Status Achieved   SLP LONG TERM GOAL #2   Title pt will use compensations for speech clarity with occasional min nomverbal cues   Status Achieved   SLP LONG TERM GOAL #3   Title pt will use compensations in 10 mintues conversation   Time 3   Period Weeks  or visits   Status New   SLP LONG TERM GOAL #4   Title pt will tell SLP he is satisfied with improvements in speech clarity over two sessions   Time 3   Period Weeks  or visits   Status New          Plan - 10/18/14 1530    Clinical Impression Statement Pt's speech is much improved today - rare distortion of  phonemes and rushes of speech. He has completed HEP as directed and reports his mother who is hard of hearing can now understand him again. He cont to have a bit more difficulty when fatigued, but this has improved as well.   Speech Therapy Frequency Biweekly   Duration --  3 weeks, or 3 visits   Treatment/Interventions Oral motor exercises;SLP instruction and feedback;Compensatory strategies;Patient/family education   Potential to Achieve Goals Good        Problem List Patient Active Problem List   Diagnosis Date Noted  . Pulmonary artery hypertension 10/07/2014  . Gynecomastia 10/07/2014  . Coronary artery calcification seen on CT scan 10/07/2014  . Solitary pulmonary nodule 09/08/2014  . Depression 09/08/2014  . Healthcare maintenance 09/08/2014  . Diastolic dysfunction 46/95/0722  . Left  sided lacunar stroke   . Diabetes mellitus with stage 3 chronic kidney disease 09/03/2014  . Hyperlipidemia 09/03/2014  . Hypertension 09/03/2014  . Tobacco use 09/03/2014  . OSA (obstructive sleep apnea) 09/03/2014    [facility] , MS, CCC-SLP  10/18/2014, 3:33 PM  [facility] [address] [place], [place], [address] Phone: [phone]   Fax:  [phone]

## 2014-10-18 NOTE — Patient Instructions (Signed)
Continue with sentences twice a day, continue to think about reducing your rate of speaking and opening your mouth when you talk,

## 2014-10-19 ENCOUNTER — Ambulatory Visit (INDEPENDENT_AMBULATORY_CARE_PROVIDER_SITE_OTHER): Payer: No Typology Code available for payment source | Admitting: Internal Medicine

## 2014-10-19 VITALS — BP 111/64 | HR 80 | Temp 98.0°F | Ht 72.0 in | Wt 240.8 lb

## 2014-10-19 DIAGNOSIS — I129 Hypertensive chronic kidney disease with stage 1 through stage 4 chronic kidney disease, or unspecified chronic kidney disease: Secondary | ICD-10-CM

## 2014-10-19 DIAGNOSIS — I10 Essential (primary) hypertension: Secondary | ICD-10-CM

## 2014-10-19 DIAGNOSIS — N183 Chronic kidney disease, stage 3 unspecified: Secondary | ICD-10-CM

## 2014-10-19 DIAGNOSIS — I6381 Other cerebral infarction due to occlusion or stenosis of small artery: Secondary | ICD-10-CM

## 2014-10-19 DIAGNOSIS — F32A Depression, unspecified: Secondary | ICD-10-CM

## 2014-10-19 DIAGNOSIS — I69322 Dysarthria following cerebral infarction: Secondary | ICD-10-CM | POA: Diagnosis not present

## 2014-10-19 DIAGNOSIS — E1122 Type 2 diabetes mellitus with diabetic chronic kidney disease: Secondary | ICD-10-CM

## 2014-10-19 DIAGNOSIS — Z720 Tobacco use: Secondary | ICD-10-CM

## 2014-10-19 DIAGNOSIS — Z23 Encounter for immunization: Secondary | ICD-10-CM

## 2014-10-19 DIAGNOSIS — F329 Major depressive disorder, single episode, unspecified: Secondary | ICD-10-CM

## 2014-10-19 DIAGNOSIS — F17210 Nicotine dependence, cigarettes, uncomplicated: Secondary | ICD-10-CM

## 2014-10-19 DIAGNOSIS — K469 Unspecified abdominal hernia without obstruction or gangrene: Secondary | ICD-10-CM

## 2014-10-19 DIAGNOSIS — Z7982 Long term (current) use of aspirin: Secondary | ICD-10-CM

## 2014-10-19 DIAGNOSIS — Z0000 Encounter for general adult medical examination without abnormal findings: Secondary | ICD-10-CM

## 2014-10-19 DIAGNOSIS — K439 Ventral hernia without obstruction or gangrene: Secondary | ICD-10-CM

## 2014-10-19 MED ORDER — ZOSTER VACCINE LIVE 19400 UNT/0.65ML ~~LOC~~ SOLR: 0.6500 mL | Freq: Once | SUBCUTANEOUS | Status: DC

## 2014-10-19 NOTE — Progress Notes (Signed)
Patient ID: [name], male   DOB: 05/09/1955, 59 y.o.   MRN: [id number]   Subjective:   Patient ID: [name] male   DOB: May 27, 1955 59 y.o.   MRN: [id number]  HPI: [name] is a 59 y.o. male with a PMH of non-insulin dependent DM type 2, HTN, HLD, CVA (2009 & 2016), Tobacco Abuse, severe OSA presents to clinic today for follow-up.   For further details of today's visit please refer to problem based charting.  Past Medical History  Diagnosis Date  . CVA (cerebral vascular accident) 2009  . Hypertension   . Hyperlipemia   . Diabetes type 2, controlled     controlled by lifestyle modification  . OSA (obstructive sleep apnea)    Current Outpatient Prescriptions  Medication Sig Dispense Refill  . aspirin EC 81 MG tablet Take 81 mg by mouth daily.    Marland Kitchen atorvastatin (LIPITOR) 40 MG tablet Take 1 tablet (40 mg total) by mouth daily at 6 PM. 90 tablet 3  . citalopram (CELEXA) 40 MG tablet Take 1 tablet (40 mg total) by mouth daily. Take 20 mg (half a tablet) for 1 week then increase to 40 mg daily 90 tablet 1  . lisinopril (PRINIVIL,ZESTRIL) 10 MG tablet Take 1 tablet (10 mg total) by mouth daily. 90 tablet 3  . pregabalin (LYRICA) 75 MG capsule Take 1 capsule (75 mg total) by mouth at bedtime as needed (for feet). 90 capsule 3   No current facility-administered medications for this visit.   Family History  Problem Relation Age of Onset  . Stroke Father   . Hypertension Mother   . Hypertension Father   . Hypertension Sister   . Hypertension Brother   . Diabetes Mother   . Diabetes Father   . Diabetes Sister   . Diabetes Brother   . Heart failure Mother   . Heart failure Father    Social History   Social History  . Marital Status: Married    Spouse Name: N/A  . Number of Children: N/A  . Years of Education: N/A   Social History Main Topics  . Smoking status: Current Every Day Smoker -- 1.00 packs/day for 30 years    Types: Cigarettes  .  Smokeless tobacco: Not on file     Comment: 1PACK EVERY 3 DAYS  . Alcohol Use: 0.0 oz/week    0 Standard drinks or equivalent per week     Comment: 2-3 drinks per year  . Drug Use: No  . Sexual Activity: Not on file   Other Topics Concern  . Not on file   Social History Narrative   Lives alone in [place], [place] (2006)   Review of Systems: Review of Systems  Constitutional: Negative for fever and chills.  Respiratory: Negative for cough and shortness of breath.   Cardiovascular: Negative for chest pain and palpitations.  Gastrointestinal: Positive for diarrhea. Negative for nausea, vomiting, abdominal pain and blood in stool.  Genitourinary: Negative for dysuria.  Musculoskeletal: Negative for myalgias.  Neurological: Negative for headaches.   Objective:  Physical Exam: Filed Vitals:   10/19/14 1326  BP: 111/64  Pulse: 80  Temp: 98 F (36.7 C)  TempSrc: Oral  Height: 6' (1.829 m)  Weight: 240 lb 12.8 oz (109.226 kg)  SpO2: 100%   Constitutional: He is oriented to person, place, and time. He appears well-developed and well-nourished. No distress.  HENT:  Head: Normocephalic and atraumatic.  Right Ear: External ear normal.  Left Ear: External ear normal.  Nose: Nose normal.  Mouth/Throat: Oropharynx is clear and moist. No oropharyngeal exudate.  Eyes: Conjunctivae and EOM are normal. Pupils are equal, round, and reactive to light. Right eye exhibits no discharge. Left eye exhibits no discharge. No scleral icterus.  Neck: Normal range of motion. Neck supple.  Cardiovascular: Normal rate, regular rhythm and normal heart sounds.  No murmur heard. Pulmonary/Chest: Effort normal. No respiratory distress. He has no wheezes. He has no rales.  Decreased breath sounds  Abdominal: Soft. Bowel sounds are normal. He exhibits no distension. There is no tenderness. There is no rebound and no guarding.  Musculoskeletal: Normal range of motion. He exhibits no edema or tenderness.    Neurological: He is alert and oriented to person, place, and time. No cranial nerve deficit. Coordination normal.  Dysarthria, hard to understand speech. Normal 5/5 muscle strength. Normal sensation to light touch of extremities.  Skin: Skin is warm and dry. No rash noted. He is not diaphoretic. No erythema. No pallor.  Psychiatric: His behavior is normal. Judgment and thought content normal.   Assessment & Plan:   Case discussed with Dr. Daryll Drown. Please refer to Problem based carting for further details of today's visit.

## 2014-10-19 NOTE — Patient Instructions (Signed)
General Instructions: - Continue to work on quitting smoking - We will call you with appointments for eye exam, colonoscopy and hernia evaluation - Come back in 3 months for recheck

## 2014-10-19 NOTE — Assessment & Plan Note (Addendum)
DM well controlled (last A1c on 09/04/14 was 5.9) with diet and exercise only. Reports he has lost over 100 lbs in the past several years. Denies any episodes of hypoglycemia today.   - Continue lifestyle modification and encourage weight loss - microalb/cr ratio wnl today - BP at goal, continue lisinopril 10 mg daily - continue atorvastatin 40 mg daily - continue Lyrica 75 mg daily - Will get eye exam scheduled at the clinic - PTH elevated with normal P and Ca. Will need to monitor levels in the future. - Vit D borderline normal, consider supplementation at next visit

## 2014-10-20 ENCOUNTER — Encounter: Payer: Self-pay | Admitting: Internal Medicine

## 2014-10-20 DIAGNOSIS — K469 Unspecified abdominal hernia without obstruction or gangrene: Secondary | ICD-10-CM | POA: Insufficient documentation

## 2014-10-20 LAB — MICROALBUMIN / CREATININE URINE RATIO
CREATININE, UR: 312.6 mg/dL
MICROALB/CREAT RATIO: 18.9 mg/g creat (ref 0.0–30.0)
MICROALBUM., U, RANDOM: 59.1 ug/mL

## 2014-10-20 LAB — PTH, INTACT AND CALCIUM
CALCIUM: 9.1 mg/dL (ref 8.7–10.2)
PTH: 93 pg/mL — ABNORMAL HIGH (ref 15–65)

## 2014-10-20 LAB — HIV ANTIBODY (ROUTINE TESTING W REFLEX): HIV Screen 4th Generation wRfx: NONREACTIVE

## 2014-10-20 LAB — VITAMIN D 25 HYDROXY (VIT D DEFICIENCY, FRACTURES): VIT D 25 HYDROXY: 31.9 ng/mL (ref 30.0–100.0)

## 2014-10-20 LAB — HEPATITIS C ANTIBODY

## 2014-10-20 LAB — PHOSPHORUS: Phosphorus: 4 mg/dL (ref 2.5–4.5)

## 2014-10-20 NOTE — Assessment & Plan Note (Signed)
BP Readings from Last 3 Encounters:  10/19/14 111/64  09/08/14 127/66  09/05/14 138/81    Lab Results  Component Value Date   NA 137 09/04/2014   K 4.5 09/04/2014   CREATININE 1.63* 09/04/2014    Assessment: Blood pressure control:  well controlled Progress toward BP goal:   at goal Comments: Patient reports compliance with medications. Currently taking Lisinopril 10 mg daily.   Plan: Medications:  continue current medications Educational resources provided:   Self management tools provided:   Other plans: none

## 2014-10-20 NOTE — Assessment & Plan Note (Signed)
  Assessment: Progress toward smoking cessation:   none Barriers to progress toward smoking cessation:   craving, habit Comments: Patient continues to smoke 1 pack every 3 days. Declines any help with cessation. Reports being on Wellbutrin in the past with a bad reaction.   Plan: Instruction/counseling given:  I counseled patient on the dangers of tobacco use, advised patient to stop smoking, and reviewed strategies to maximize success. Educational resources provided:    Self management tools provided:    Medications to assist with smoking cessation:  none Patient agreed to the following self-care plans for smoking cessation:    Other plans:

## 2014-10-20 NOTE — Assessment & Plan Note (Signed)
Patient reports right sided abdominal hernia that has been increasing in size over the past 2 years. Has pain with lifting heavy objects but otherwise has no issues. On exam today, has easily reducible abdominal hernia. Wishes to see a surgeon for repair.  - Referral to general surgery

## 2014-10-20 NOTE — Assessment & Plan Note (Signed)
Patient reports symptoms well controlled on Celexa 40 mg daily. Continue current treatment.

## 2014-10-20 NOTE — Assessment & Plan Note (Signed)
-  Referral for colonoscopy today -Tdap, Flu shots given today -Script for Shingles vaccine given today -Referral for eye exam today -HIV and HCV negative

## 2014-10-20 NOTE — Assessment & Plan Note (Addendum)
Patient reports improvement in dysarthria with speech therapy. No other residual deficits. Continues to smoke, discussed smoking cessation strategies at length today. Was on Wellbutrin in the past with a bad reaction. Does not wish to have any help quitting today though he has made no progress yet on quitting.  -Continue aspirin 81 mg daily  -BP at goal <140/90, continue lisinopril 10 mg daily  -Continue atorvastatin 40 mg daily  -A1c 5.9 at goal <7, continue lifestyle modification - F/U with Dr. Erlinda Hong on 9/19

## 2014-10-25 NOTE — Progress Notes (Signed)
Internal Medicine Clinic Attending  I saw and evaluated the patient.  I personally confirmed the key portions of the history and exam documented by Dr. Boswell and I reviewed pertinent patient test results.  The assessment, diagnosis, and plan were formulated together and I agree with the documentation in the resident's note. 

## 2014-10-27 ENCOUNTER — Encounter: Payer: Self-pay | Admitting: Internal Medicine

## 2014-11-01 ENCOUNTER — Ambulatory Visit: Payer: No Typology Code available for payment source | Attending: Internal Medicine

## 2014-11-01 DIAGNOSIS — I69922 Dysarthria following unspecified cerebrovascular disease: Secondary | ICD-10-CM | POA: Insufficient documentation

## 2014-11-01 DIAGNOSIS — IMO0002 Reserved for concepts with insufficient information to code with codable children: Secondary | ICD-10-CM

## 2014-11-01 NOTE — Therapy (Signed)
[facility] [address] [place], [place], [address] Phone: [phone]   Fax:  [phone]  Speech Language Pathology Treatment  Patient Details  Name: [name] MRN: [id number] Date of Birth: [birth year] Referring Provider:  [name], MD  Encounter Date: 11/01/2014      End of Session - 11/01/14 1120    Visit Number 3   Number of Visits 6   Date for SLP Re-Evaluation 12/09/14   SLP Start Time 1105   SLP Stop Time  1121   SLP Time Calculation (min) 16 min   Activity Tolerance Patient tolerated treatment well      Past Medical History  Diagnosis Date  . CVA (cerebral vascular accident) 2009  . Hypertension   . Hyperlipemia   . Diabetes type 2, controlled     controlled by lifestyle modification  . OSA (obstructive sleep apnea)     Past Surgical History  Procedure Laterality Date  . Appendectomy    . Knee surgery      There were no vitals filed for this visit.  Visit Diagnosis: Dysarthria due to cerebrovascular accident      Subjective Assessment - 11/01/14 1107    Subjective "I think it's pretty close (to baseline)."               ADULT SLP TREATMENT - 11/01/14 1107    General Information   Behavior/Cognition Pleasant mood;Cooperative;Alert   Treatment Provided   Treatment provided Cognitive-Linquistic   Pain Assessment   Pain Assessment No/denies pain   Cognitive-Linquistic Treatment   Treatment focused on Dysarthria   Skilled Treatment Pt spoke in 20 minutes conversation today with rushes of speech x1. Intelligibility at 100%. Pt reports speech back at baseline, essentially. He has reportedly continued to complete HEP as directed.   Assessment / Recommendations / Plan   Plan Discharge SLP treatment due to (comment)  pt satisfied with progress and has met all goals   Progression Toward Goals   Progression toward goals Goals met, education completed, patient  discharged from [facility] - 11/01/14 1109    [place] #1   Title pt will perform HEP for compensatory speech measures with rare min A   Status Achieved   SLP LONG TERM GOAL #2   Title pt will use compensations for speech clarity with occasional min nomverbal cues   Status Achieved   SLP LONG TERM GOAL #3   Title pt will use compensations in 10 mintues conversation   Period --  or visits   Status Achieved   SLP LONG TERM GOAL #4   Title pt will tell SLP he is satisfied with improvements in speech clarity over two sessions   Period --  or visits   Status Achieved          Plan - 11/01/14 1137    Clinical Impression Statement Pt reports his speech is essentially at baseline today. He spoke in 20 minutes conversation with intelligibility at 100%, and rushes of speech x1. He is satisfied with progress and will be discharged. SLP suggested pt cont HEP for 3-4 more weeks.   Treatment/Interventions Oral motor exercises;SLP instruction and feedback;Compensatory strategies;Patient/family education     SPEECH THERAPY DISCHARGE SUMMARY  Visits from Start of Care: 3  Current functional level related to goals / functional outcomes: In three sessions pt reported speech has essentially returned  to baseline. There is no more reason for pt to continue with ST at this time. He is pleased with progress. SLP suggested pt cont with HEP for 3-4 more weeks, then d/c HEP. Pt produced one rush of speech today which did not detract from intelligibility, at 100%   Remaining deficits: None   Education / Equipment: HEP, compensations for dysarthria.  Plan: Patient agrees to discharge.  Patient goals were met. Patient is being discharged due to meeting the stated rehab goals.  ?????He is pleased with progress.        Problem List Patient Active Problem List   Diagnosis Date Noted  . Abdominal hernia 10/20/2014  . Pulmonary artery hypertension  10/07/2014  . Gynecomastia 10/07/2014  . Coronary artery calcification seen on CT scan 10/07/2014  . Solitary pulmonary nodule 09/08/2014  . Depression 09/08/2014  . Healthcare maintenance 09/08/2014  . Diastolic dysfunction 59/97/7414  . Left sided lacunar stroke   . Diabetes mellitus with stage 3 chronic kidney disease 09/03/2014  . Hyperlipidemia 09/03/2014  . Hypertension 09/03/2014  . Tobacco use 09/03/2014  . OSA (obstructive sleep apnea) 09/03/2014    [facility] , MS, CCC-SLP   11/01/2014, 11:41 AM  [facility] [address] [place], [place], [address] Phone: [phone]   Fax:  [phone]

## 2014-11-02 ENCOUNTER — Encounter: Payer: Self-pay | Admitting: Dietician

## 2014-11-04 ENCOUNTER — Encounter: Payer: Self-pay | Admitting: Dietician

## 2014-11-04 ENCOUNTER — Ambulatory Visit (INDEPENDENT_AMBULATORY_CARE_PROVIDER_SITE_OTHER): Payer: No Typology Code available for payment source | Admitting: Dietician

## 2014-11-04 DIAGNOSIS — E1322 Other specified diabetes mellitus with diabetic chronic kidney disease: Secondary | ICD-10-CM

## 2014-11-04 DIAGNOSIS — N183 Chronic kidney disease, stage 3 unspecified: Secondary | ICD-10-CM

## 2014-11-04 DIAGNOSIS — E1122 Type 2 diabetes mellitus with diabetic chronic kidney disease: Secondary | ICD-10-CM

## 2014-11-04 LAB — HM DIABETES EYE EXAM

## 2014-11-04 NOTE — Progress Notes (Signed)
retinal images done and transmitted today. Diabetes diagnosis date ~ 2004.

## 2014-11-07 ENCOUNTER — Ambulatory Visit (INDEPENDENT_AMBULATORY_CARE_PROVIDER_SITE_OTHER): Payer: No Typology Code available for payment source | Admitting: Neurology

## 2014-11-07 ENCOUNTER — Encounter: Payer: Self-pay | Admitting: Neurology

## 2014-11-07 VITALS — BP 125/76 | HR 67 | Ht 72.0 in | Wt 241.6 lb

## 2014-11-07 DIAGNOSIS — I639 Cerebral infarction, unspecified: Secondary | ICD-10-CM | POA: Insufficient documentation

## 2014-11-07 DIAGNOSIS — Z720 Tobacco use: Secondary | ICD-10-CM | POA: Diagnosis not present

## 2014-11-07 DIAGNOSIS — M25519 Pain in unspecified shoulder: Secondary | ICD-10-CM | POA: Insufficient documentation

## 2014-11-07 DIAGNOSIS — G4733 Obstructive sleep apnea (adult) (pediatric): Secondary | ICD-10-CM

## 2014-11-07 DIAGNOSIS — F17200 Nicotine dependence, unspecified, uncomplicated: Secondary | ICD-10-CM | POA: Insufficient documentation

## 2014-11-07 DIAGNOSIS — M542 Cervicalgia: Secondary | ICD-10-CM

## 2014-11-07 DIAGNOSIS — I10 Essential (primary) hypertension: Secondary | ICD-10-CM

## 2014-11-07 DIAGNOSIS — M25512 Pain in left shoulder: Secondary | ICD-10-CM

## 2014-11-07 DIAGNOSIS — E785 Hyperlipidemia, unspecified: Secondary | ICD-10-CM | POA: Diagnosis not present

## 2014-11-07 DIAGNOSIS — E1159 Type 2 diabetes mellitus with other circulatory complications: Secondary | ICD-10-CM | POA: Diagnosis not present

## 2014-11-07 HISTORY — DX: Cerebral infarction, unspecified: I63.9

## 2014-11-07 NOTE — Patient Instructions (Addendum)
-   continue ASA and lipitor for stroke prevention - quit smoking - check BP at home - Follow up with your primary care physician for stroke risk factor modification. Recommend maintain blood pressure goal <130/80, diabetes with hemoglobin A1c goal below 6.5% and lipids with LDL cholesterol goal below 70 mg/dL.  - continue celexa for depression and mood - will need to repeat sleep study to adjust CPAP - PT for left shoulder pain and neck pain - nerve conduction study to evaluate cervical radiculopathy - follow up in 3 months

## 2014-11-07 NOTE — Progress Notes (Signed)
STROKE NEUROLOGY FOLLOW UP NOTE  NAME: [name] DOB: 11/12/1955  REASON FOR VISIT: stroke follow up HISTORY FROM: pt and wife and chart  Today we had the pleasure of seeing [name] in follow-up at our Neurology Clinic. Pt was accompanied by wife.   History Summary [name] is a 59 y.o. male with history of CVA in 2009, HTN, HLD, DM, current smoker, previous carotid artery stenting, and OSA, who has not had regular medical follow-up for several years and apparently stopped taking all of his medications several months prior was admitted on 09/03/14 for left corona radiata/external capsule lacunar infarct secondary to likely small vessel disease source. MRA showed left PCA occlusion. 2D echo and CUS unremarkable. LDL 151 and A1C 5.9. Dysarthria much better and he was discharged with ASA and lipitor.  Interval History During the interval time, the patient has been doing well. Slurry speech resolved. However, he started to have neck pain on the left and left shoulder pain intermittently. His left arm seems weaker than before. He continued to smoke. Compliant with ASA and lipitor now. BP 125/70 in clinic. He is on celexa for mood and depression, much improved as per wife. He has OSA but his CPAP mask does not fit, so he is not on CPAP for a long time.     REVIEW OF SYSTEMS: Full 14 system review of systems performed and notable only for those listed below and in HPI above, all others are negative:  Constitutional:  fatigue Cardiovascular:  Ear/Nose/Throat:  Trouble swallowing Skin:  Eyes:   Respiratory:  Cough, snoring Gastroitestinal:   Genitourinary:  Hematology/Lymphatic:  Easy bleeding Endocrine:  Musculoskeletal:   Allergy/Immunology:   Neurological:  Slurred speech, difficulty swallowing Psychiatric: anxiety, decreased energy Sleep: snoring  The following represents the patient's updated allergies and side effects list: No Known  Allergies  The neurologically relevant items on the patient's problem list were reviewed on today's visit.  Neurologic Examination  A problem focused neurological exam (12 or more points of the single system neurologic examination, vital signs counts as 1 point, cranial nerves count for 8 points) was performed.  Blood pressure 125/76, pulse 67, height 6' (1.829 m), weight 241 lb 9.6 oz (109.589 kg).  General - Well nourished, well developed, in no apparent distress.  Ophthalmologic - Sharp disc margins OU.   Cardiovascular - Regular rate and rhythm with no murmur.  Mental Status -  Level of arousal and orientation to time, place, and person were intact. Language including expression, naming, repetition, comprehension was assessed and found intact. Fund of Knowledge was assessed and was intact.  Cranial Nerves II - XII - II - Visual field intact OU. III, IV, VI - Extraocular movements intact. V - Facial sensation intact bilaterally. VII - Facial movement intact bilaterally. VIII - Hearing & vestibular intact bilaterally. X - Palate elevates symmetrically. XI - Chin turning & shoulder shrug intact bilaterally. XII - Tongue protrusion intact.  Motor Strength - The patient's strength was normal in all extremities except LUE 4/5 proximal and 4+/5 bicep but distal 5/5 and pronator drift was absent.  Bulk was normal and fasciculations were absent.   Motor Tone - Muscle tone was assessed at the neck and appendages and was normal.  Reflexes - The patient's reflexes were 1+ in all extremities and he had no pathological reflexes.  Sensory - Light touch, temperature/pinprick, vibration and proprioception, and Romberg testing were assessed and were normal    Coordination -  The patient had normal movements in the hands and feet with no ataxia or dysmetria.  Tremor was absent.  Gait and Station - The patient's transfers, posture, gait, station, and turns were observed as normal.  Data  reviewed: I personally reviewed the images and agree with the radiology interpretations.  Ct Head Wo Contrast 09/03/2014 1. Progression of left frontal and parietal lobe deep white matter infarcts. 2. No acute intracranial hemorrhage, stroke or mass noted.   Mr Brain Wo Contrast 09/03/2014 1. Acute on chronic small vessel ischemia. Acute lacunar type infarct extending from the left corona radiata through the left external capsule. No hemorrhage or mass effect. 2. Small chronic white matter and cortically based infarcts throughout the left MCA territory. Small chronic right thalamic lacunar infarct.   MRA brain Fetal origin left posterior cerebral artery which is occluded in the midportion. Anterior circulation shows no significant stenosis.  Carotid Doppler There is 1-39% bilateral ICA stenosis. Vertebral artery flow is antegrade.   2D Echocardiogram  - Left ventricle: The cavity size was normal. Wall thickness wasincreased in a pattern of mild LVH. Systolic function was normal.The estimated ejection fraction was in the range of 60% to 65%.Wall motion was normal; there were no regional wall motionabnormalities. Doppler parameters are consistent with abnormalleft ventricular relaxation (grade 1 diastolic dysfunction). TheE/e&' ratio is >15, suggesting elevated LV filling pressure. - Aortic valve: Trileaflet. The left coronary cusp appears to beimmobile. There was no stenosis. There was no regurgitation. - Mitral valve: Calcified annulus. Calcified chordae. Trivial MR. - Left atrium: Moderately dilated at 42 ml/m2. - Right atrium: Mildly dilated at 22 cm2. - Inferior vena cava: The vessel was normal in size. Therespirophasic diameter changes were in the normal range (>= 50%),consistent with normal central venous pressure. Impressions: LVEF 60-65%, mild LVH, diastolic dysfunction, elevated LV fillingpressure, immobile left coronary cusp of the aortic valve(without  significant stenosis), MAC with trivial MR, moderateLAE, mild RAE, normal IVC.  Component     Latest Ref Rng 09/03/2014 09/04/2014  Cholesterol     0 - 200 mg/dL  203 (H)  Triglycerides     <150 mg/dL  106  HDL Cholesterol     >40 mg/dL  31 (L)  Total CHOL/HDL Ratio       6.5  VLDL     0 - 40 mg/dL  21  LDL (calc)     0 - 99 mg/dL  151 (H)  Hemoglobin A1C     4.8 - 5.6 % 6.0 (H) 5.9 (H)  Mean Plasma Glucose      126 123    Assessment: As you may recall, he is a 59 y.o. African American male with PMH of CVA in 2009, HTN, HLD, DM, current smoker, previous carotid artery stenting, and OSA was admitted on 09/03/14 for left corona radiata/external capsule lacunar infarct secondary to likely small vessel disease source. He was not on medication due to noncompliance prior to admission. MRA showed left PCA occlusion. 2D echo and CUS unremarkable. LDL 151 he was discharged with ASA and lipitor. He has been doing well but developed left neck pain and shoulder pain causing left UE proximal weakness, likely due to radiculopathy. Will do EMG, and PT. His CPAP mask did not fit him for a long time, will need sleep study.   Plan:  - continue ASA and lipitor for stroke prevention - quit smoking  - check BP at home - Follow up with your primary care physician for stroke risk factor modification.  Recommend maintain blood pressure goal <130/80, diabetes with hemoglobin A1c goal below 6.5% and lipids with LDL cholesterol goal below 70 mg/dL.  - continue celexa for depression and mood - Sleep study to adjust CPAP - PT for left shoulder pain and neck pain - EMG/NCS to evaluate cervical radiculopathy - RTC in 3 months  Orders Placed This Encounter  Procedures  . Ambulatory referral to Sleep Studies    Referral Priority:  Routine    Referral Type:  Consultation    Referral Reason:  Specialty Services Required    Number of Visits Requested:  1  . Ambulatory referral to Physical Therapy    Referral  Priority:  Routine    Referral Type:  Physical Medicine    Referral Reason:  Specialty Services Required    Requested Specialty:  Physical Therapy    Number of Visits Requested:  1  . NCV with EMG(electromyography)    Evaluate cervical radiculopathy    Standing Status: Future     Number of Occurrences:      Standing Expiration Date: 11/07/2015    Order Specific Question:  Where should this test be performed?    Answer:  [facility]    No orders of the defined types were placed in this encounter.    Patient Instructions  - continue ASA and lipitor for stroke prevention - quit smoking - check BP at home - Follow up with your primary care physician for stroke risk factor modification. Recommend maintain blood pressure goal <130/80, diabetes with hemoglobin A1c goal below 6.5% and lipids with LDL cholesterol goal below 70 mg/dL.  - continue celexa for depression and mood - will need to repeat sleep study to adjust CPAP - PT for left shoulder pain and neck pain - nerve conduction study to evaluate cervical radiculopathy - follow up in 3 months   Rosalin Hawking, MD PhD [facility] [address] [place], [place] [address] [phone]

## 2014-11-09 ENCOUNTER — Other Ambulatory Visit: Payer: Self-pay | Admitting: Surgery

## 2014-11-09 NOTE — H&P (Signed)
[name] 11/09/2014 8:27 AM Location: [facility] Patient #: [id number] DOB: 1956-02-08 Married / Language: English / Race: White Male History of Present Illness Chad Hector MD; 11/09/2014 9:23 AM) The patient is a 59 year old male who presents with an incisional hernia. Patient sent for surgical consultation by Dr. Charlynn Grimes for concern of ventral hernia. Diabetic hypertensive male with history of strokes in 2009 a 2016. Smoker. Complaining of hernia and abdomen for the past 2 years. Increasing and becoming symptomatic. Discuss with his primary care physician. Surgical consultation recommended. Patient comes today with his wife. Strokes felt to be due to uncontrolled blood pressure. That is much better controlled. He is intentionally lost over 100 pounds. He is cutback of smoking and half. His colonoscopy. [place] getting his life tuned up. An complaint is pain in his RIGHT lower abdomen. It is been intermittent and now constant for the past 2 years. He is felt popping when he coughs and does heavy lifting. Occasionally is felt a lump as well. Not always there. He was concerned. Primary care physician concerned for possible hernia. Patient had an appendectomy but the pain and lump seems to be a few inches above all that. Surgeries. He can walk about 15 minutes before stopping. No heart attacks. He is not on oxygen. Other Problems Marjean Donna, CMA; 11/09/2014 8:27 AM) Anxiety Disorder Arthritis Back Pain Cerebrovascular Accident Diabetes Mellitus Emphysema Of Lung High blood pressure Hypercholesterolemia Sleep Apnea Umbilical Hernia Repair  Past Surgical History Marjean Donna, CMA; 11/09/2014 8:27 AM) Appendectomy Carotid Artery Surgery Left. Knee Surgery Left.  Diagnostic Studies History Marjean Donna, CMA; 11/09/2014 8:27 AM) Colonoscopy never  Allergies Davy Pique Bynum, CMA; 11/09/2014 8:28 AM) No Known Drug Allergies  11/09/2014  Medication History (Sonya Bynum, CMA; 11/09/2014 8:28 AM) Lyrica (75MG  Capsule, Oral) Active. Atorvastatin Calcium (40MG  Tablet, Oral) Active. Citalopram Hydrobromide (40MG  Tablet, Oral) Active. Lisinopril (10MG  Tablet, Oral) Active. Medications Reconciled  Social History Marjean Donna, CMA; 11/09/2014 8:27 AM) Alcohol use Remotely quit alcohol use. Caffeine use Carbonated beverages, Tea. Illicit drug use Prefer to discuss with Lareina Espino. Tobacco use Current every day smoker.  Family History Marjean Donna, [place]; 11/09/2014 8:27 AM) Alcohol Abuse Sister. Arthritis Father, Mother, Sister. Breast Cancer Mother. Cerebrovascular Accident Father. Colon Polyps Father, Mother. Depression Sister. Diabetes Mellitus Brother, Father, Mother, Sister. Heart Disease Father, Mother. Heart disease in male family member before age 22 Heart disease in male family member before age 43 Hypertension Brother, Father, Mother, Sister. Kidney Disease Father. Respiratory Condition Father.     Review of Systems (Alpha; 11/09/2014 8:27 AM) General Present- Fatigue. Not Present- Appetite Loss, Chills, Fever, Night Sweats, Weight Gain and Weight Loss. Skin Not Present- Change in Wart/Mole, Dryness, Hives, Jaundice, New Lesions, Non-Healing Wounds, Rash and Ulcer. HEENT Present- Seasonal Allergies. Not Present- Earache, Hearing Loss, Hoarseness, Nose Bleed, Oral Ulcers, Ringing in the Ears, Sinus Pain, Sore Throat, Visual Disturbances, Wears glasses/contact lenses and Yellow Eyes. Respiratory Present- Chronic Cough and Snoring. Not Present- Bloody sputum, Difficulty Breathing and Wheezing. Cardiovascular Not Present- Chest Pain, Difficulty Breathing Lying Down, Leg Cramps, Palpitations, Rapid Heart Rate, Shortness of Breath and Swelling of Extremities. Gastrointestinal Present- Change in Bowel Habits. Not Present- Abdominal Pain, Bloating, Bloody Stool, Chronic diarrhea,  Constipation, Difficulty Swallowing, Excessive gas, Gets full quickly at meals, Hemorrhoids, Indigestion, Nausea, Rectal Pain and Vomiting. Male Genitourinary Present- Impotence. Not Present- Blood in Urine, Change in Urinary Stream, Frequency, Nocturia, Painful Urination, Urgency and Urine Leakage. Musculoskeletal Present- Joint  Pain, Muscle Pain and Muscle Weakness. Not Present- Back Pain, Joint Stiffness and Swelling of Extremities. Neurological Not Present- Decreased Memory, Fainting, Headaches, Numbness, Seizures, Tingling, Tremor, Trouble walking and Weakness. Psychiatric Present- Anxiety. Not Present- Bipolar, Change in Sleep Pattern, Depression, Fearful and Frequent crying. Endocrine Not Present- Cold Intolerance, Excessive Hunger, Hair Changes, Heat Intolerance, Hot flashes and New Diabetes. Hematology Not Present- Easy Bruising, Excessive bleeding, Gland problems, HIV and Persistent Infections.  Vitals (Sonya Bynum CMA; 11/09/2014 8:28 AM) 11/09/2014 8:28 AM Weight: 238 lb Height: 72in Body Surface Area: 2.34 m Body Mass Index: 32.28 kg/m Temp.: 40F(Temporal)  Pulse: 76 (Regular)  BP: 118/76 (Sitting, Left Arm, Standard)     Physical Exam Chad Hector MD; 11/09/2014 9:22 AM)  General Mental Status-Alert. General Appearance-Not in acute distress, Not Sickly. Orientation-Oriented X3. Hydration-Well hydrated. Voice-Normal.  Integumentary Global Assessment Upon inspection and palpation of skin surfaces of the - Axillae: non-tender, no inflammation or ulceration, no drainage. and Distribution of scalp and body hair is normal. General Characteristics Temperature - normal warmth is noted.  Head and Neck Head-normocephalic, atraumatic with no lesions or palpable masses. Face Global Assessment - atraumatic, no absence of expression. Neck Global Assessment - no abnormal movements, no bruit auscultated on the right, no bruit auscultated on the left,  no decreased range of motion, non-tender. Trachea-midline. Thyroid Gland Characteristics - non-tender.  Eye Eyeball - Left-Extraocular movements intact, No Nystagmus. Eyeball - Right-Extraocular movements intact, No Nystagmus. Cornea - Left-No Hazy. Cornea - Right-No Hazy. Sclera/Conjunctiva - Left-No scleral icterus, No Discharge. Sclera/Conjunctiva - Right-No scleral icterus, No Discharge. Pupil - Left-Direct reaction to light normal. Pupil - Right-Direct reaction to light normal.  ENMT Ears Pinna - Left - no drainage observed, no generalized tenderness observed. Right - no drainage observed, no generalized tenderness observed. Nose and Sinuses External Inspection of the Nose - no destructive lesion observed. Inspection of the nares - Left - quiet respiration. Right - quiet respiration. Mouth and Throat Lips - Upper Lip - no fissures observed, no pallor noted. Lower Lip - no fissures observed, no pallor noted. Nasopharynx - no discharge present. Oral Cavity/Oropharynx - Tongue - no dryness observed. Oral Mucosa - no cyanosis observed. Hypopharynx - no evidence of airway distress observed.  Chest and Lung Exam Inspection Movements - Normal and Symmetrical. Accessory muscles - No use of accessory muscles in breathing. Palpation Palpation of the chest reveals - Non-tender. Auscultation Breath sounds - Normal and Clear.  Cardiovascular Auscultation Rhythm - Regular. Murmurs & Other Heart Sounds - Auscultation of the heart reveals - No Murmurs and No Systolic Clicks.  Abdomen Inspection Inspection of the abdomen reveals - No Visible peristalsis and No Abnormal pulsations. Umbilicus - No Bleeding, No Urine drainage. Palpation/Percussion Palpation and Percussion of the abdomen reveal - Soft, Non Tender, No Rebound tenderness, No Rigidity (guarding) and No Cutaneous hyperesthesia. Note: Obese but very soft. Possible mild suprapubic umbilical diastases recti.  Subtle atypical swelling and RIGHT infraumbilical paramedian region. Location for classic spigelian hernia. RIGHT groin suprapubic incision consistent with prior appendectomy. No definite hernia there.   Male Genitourinary Sexual Maturity Tanner 5 - Adult hair pattern and Adult penile size and shape. Note: Circumcised male. Testes and epididymides and cords normal. No inguinal hernias. Moderate sized mons pubis. Hygiene good.   Peripheral Vascular Upper Extremity Inspection - Left - No Cyanotic nailbeds, Not Ischemic. Right - No Cyanotic nailbeds, Not Ischemic.  Neurologic Neurologic evaluation reveals -normal attention span and ability to concentrate, able to  name objects and repeat phrases. Appropriate fund of knowledge , normal sensation and normal coordination. Mental Status Affect - not angry, not paranoid. Cranial Nerves-Normal Bilaterally. Gait-Normal.  Neuropsychiatric Mental status exam performed with findings of-able to articulate well with normal speech/language, rate, volume and coherence, thought content normal with ability to perform basic computations and apply abstract reasoning and no evidence of hallucinations, delusions, obsessions or homicidal/suicidal ideation. Note: Contents to mumble. However no difficulty with gait. No significant dysarthria   Musculoskeletal Global Assessment Spine, Ribs and Pelvis - no instability, subluxation or laxity. Right Upper Extremity - no instability, subluxation or laxity.  Lymphatic Head & Neck  General Head & Neck Lymphatics: Bilateral - Description - No Localized lymphadenopathy. Axillary  General Axillary Region: Bilateral - Description - No Localized lymphadenopathy. Femoral & Inguinal  Generalized Femoral & Inguinal Lymphatics: Left - Description - No Localized lymphadenopathy. Right - Description - No Localized lymphadenopathy.    Assessment & Plan Chad Hector MD; 11/09/2014 9:20 AM)  Nash Mantis  HERNIA (K43.9) Impression: Bulging discomfort suspicious first began hernia. Other possibility is an incisional hernia at appendectomy although his lump seems more cephalad.  Another possibility is muscle strain. I think he would benefit from a trial of anti-inflammatories (tylenol) and heat for the next 2 weeks to see if soreness will call down.  However this is been going on for 2 years.  I offered diagnostic laparoscopy with repair of hernias found. He is leaning more towards surgery since this is affecting his quality of life.  Again strongly recommend he completely quit smoking. I'm glad his lost weight at smoking makes his risk, patient's of surgery higher. He will consider it.  Current Plans You are being scheduled for surgery - Our schedulers will call you.  You should hear from our office's scheduling department within 5 working days about the location, date, and time of surgery. We try to make accommodations for patient's preferences in scheduling surgery, but sometimes the OR schedule or the surgeon's schedule prevents [place] from making those accommodations.  If you have not heard from our office [phone]) in 5 working days, call the office and ask for your surgeon's nurse.  If you have other questions about your diagnosis, plan, or surgery, call the office and ask for your surgeon's nurse. Pt Education - Pamphlet Given - Laparoscopic Hernia Repair: discussed with patient and provided information.   The anatomy & physiology of the abdominal wall was discussed. The pathophysiology of hernias was discussed. Natural history risks without surgery including progeressive enlargement, pain, incarceration, & strangulation was discussed. Contributors to complications such as smoking, obesity, diabetes, prior surgery, etc were discussed.  I feel the risks of no intervention will lead to serious problems that outweigh the operative risks; therefore, I recommended surgery to reduce and  repair the hernia. I explained laparoscopic techniques with possible need for an open approach. I noted the probable use of mesh to patch and/or buttress the hernia repair  Risks such as bleeding, infection, abscess, need for further treatment, heart attack, death, and other risks were discussed. I noted a good likelihood this will help address the problem. Goals of post-operative recovery were discussed as well. Possibility that this will not correct all symptoms was explained. I stressed the importance of low-impact activity, aggressive pain control, avoiding constipation, & not pushing through pain to minimize risk of post-operative chronic pain or injury. Possibility of reherniation especially with smoking, obesity, diabetes, immunosuppression, and other health conditions was discussed. We will work  to minimize complications.  An educational handout further explaining the pathology & treatment options was given as well. Questions were answered. The patient expresses understanding & wishes to proceed with surgery. Pt Education - CCS Hernia Post-Op HCI (Gross): discussed with patient and provided information. Pt Education - CCS Pain control - tylenol only: discussed with patient and provided information. Pt Education - CCS STOP SMOKING! Pt Education - CCS - General recommendations  [name], M.D., F.A.C.S. Gastrointestinal and Minimally Invasive Surgery [facility] [address] [place], [place] [address] [phone] Main / Paging

## 2014-11-14 ENCOUNTER — Encounter: Payer: Self-pay | Admitting: Dietician

## 2014-11-24 ENCOUNTER — Encounter (INDEPENDENT_AMBULATORY_CARE_PROVIDER_SITE_OTHER): Payer: Self-pay | Admitting: Diagnostic Neuroimaging

## 2014-11-24 ENCOUNTER — Ambulatory Visit (INDEPENDENT_AMBULATORY_CARE_PROVIDER_SITE_OTHER): Payer: No Typology Code available for payment source | Admitting: Diagnostic Neuroimaging

## 2014-11-24 DIAGNOSIS — M542 Cervicalgia: Secondary | ICD-10-CM

## 2014-11-24 DIAGNOSIS — M25512 Pain in left shoulder: Secondary | ICD-10-CM

## 2014-11-24 DIAGNOSIS — Z0289 Encounter for other administrative examinations: Secondary | ICD-10-CM

## 2014-11-24 NOTE — Procedures (Signed)
   [facility]  NCS (NERVE CONDUCTION STUDY) WITH EMG (ELECTROMYOGRAPHY) REPORT   STUDY DATE: 11/24/14 PATIENT NAME: [name] DOB: 07-Dec-1955 MRN: [id number]  ORDERING CLINICIAN: Rosalin Hawking, MD PhD   TECHNOLOGIST: Laretta Alstrom ELECTROMYOGRAPHER: Earlean Polka. Khairi Garman, MD  CLINICAL INFORMATION: 59 year old male left arm/shoulder pain.  FINDINGS: NERVE CONDUCTION STUDY: Bilateral median motor responses have prolonged distal latencies, normal amplitudes, normal conduction velocities and normal F-wave latencies. Bilateral ulnar motor responses and F wave latencies are normal.  Bilateral median sensory responses have normal amplitudes and slow conduction velocities.  Bilateral ulnar sensory responses are normal.   NEEDLE ELECTROMYOGRAPHY: Needle examination of left upper extremity deltoid, biceps, triceps, flexor carpi radialis, first dorsal interosseous is normal. Left C4-5 paraspinal muscles are normal.   IMPRESSION:  Abnormal study demonstrate: 1. Mild bilateral median neuropathies at the wrist consistent with mild carpal tunnel syndrome. 2. No evidence of left cervical radiculopathy.   INTERPRETING PHYSICIAN:  Penni Bombard, MD Certified in Neurology, Neurophysiology and Neuroimaging  [facility] [address], [place], [place] [address] [phone]

## 2014-11-25 ENCOUNTER — Ambulatory Visit
Payer: No Typology Code available for payment source | Attending: Internal Medicine | Admitting: Rehabilitative and Restorative Service Providers"

## 2014-11-25 DIAGNOSIS — M542 Cervicalgia: Secondary | ICD-10-CM

## 2014-11-25 DIAGNOSIS — M25512 Pain in left shoulder: Secondary | ICD-10-CM

## 2014-11-25 NOTE — Patient Instructions (Signed)
Shoulder Shrug    Bring shoulders up toward ears. DON'T DROP YOUR HEAD *CAN USE A MIRROR. Hold ___3_ seconds. Relax. Repeat _10___ times. Do __1-2__ sessions per day.  http://gt2.exer.us/12   Copyright  VHI. All rights reserved.  Horizontal Abduction (Resistive Band)    HOLD BAND IN YOUR HANDS AND PULL BOTH ARMS AWAY FROM EACH OTHER.  With arms at shoulder level, keep elbows straight.  Hold ___5_ seconds. Repeat _10___ times. Do _1-2___ sessions per day.  Copyright  VHI. All rights reserved.  NECK: Tax adviser Down: Tuck chin, push back of neck into pillow. _10__ reps per set, _1-2__ sets per day.  Copyright  VHI. All rights reserved.  Neck Diagonal - Arms at Sides    Standing facing forward, arms at sides, turn head halfway to one side and move head diagonally looking towards your shoulder.  Hold for 20 seconds.  Repeat on other side. Do __3__ repetitions, __1-2 times per day..  http://bt.exer.us/254   Copyright  VHI. All rights reserved.    Positioning: Lying on Unaffected Side    Elevate arm: Shoulder is positioned forward. Elbow straightened as able. Hand with palm down.   Copyright  VHI. All rights reserved.

## 2014-11-25 NOTE — Therapy (Signed)
[facility] [address] [place], [place], [address] Phone: [phone]   Fax:  [phone]  Physical Therapy Evaluation  Patient Details  Name: [name] MRN: [id number] Date of Birth: 1955/06/14 Referring Provider:  Rosalin Hawking, MD  Encounter Date: 11/25/2014      PT End of Session - 11/25/14 1336    Visit Number 1   Number of Visits 4   Date for PT Re-Evaluation 12/25/14   Authorization Type private insurance 20 visit limit   PT Start Time 1018   PT Stop Time 1100   PT Time Calculation (min) 42 min   Activity Tolerance Patient tolerated treatment well   Behavior During Therapy [facility] for tasks assessed/performed      Past Medical History  Diagnosis Date  . CVA (cerebral vascular accident) 2009  . Hypertension   . Hyperlipemia   . Diabetes type 2, controlled     controlled by lifestyle modification  . OSA (obstructive sleep apnea)     Past Surgical History  Procedure Laterality Date  . Appendectomy    . Knee surgery      There were no vitals filed for this visit.  Visit Diagnosis:  Neck pain  Pain in joint of left shoulder      Subjective Assessment - 11/25/14 1024    Subjective The patient reports onset of shoulder and neck pain s/p CVA 09/03/2014.  Pain is aggravated by positions at night.     Patient Stated Goals reduce pain at night   Currently in Pain? No/denies   Pain Location Shoulder   Pain Orientation Left   Pain Descriptors / Indicators Aching   Pain Onset More than a month ago   Pain Frequency Intermittent   Aggravating Factors  movement, worse pain with overhead reaching, and sleeping positions   Pain Relieving Factors improved with positioning in bed,            [facility] PT Assessment - 11/25/14 1028    Assessment   Medical Diagnosis shoulder pain, neck pain   Onset Date/Surgical Date 09/03/14   Hand Dominance Right   Prior Therapy speech therapy    Balance Screen    Has the patient fallen in the past 6 months No   Has the patient had a decrease in activity level because of a fear of falling?  No   Is the patient reluctant to leave their home because of a fear of falling?  No   Home Environment   Living Environment Private residence   Living Arrangements Alone  wife stays with family member that has dementia   Type of [place] One level   Prior Function   Level of Independence Independent  feed cows, does housework indep   Observation/Other Assessments   Focus on Therapeutic Outcomes (FOTO)  57%   Other Surveys  --  neck disability index=20%   ROM / Strength   AROM / PROM / Strength AROM;Strength   AROM   Overall AROM  Within functional limits for tasks performed   Strength   Overall Strength Comments 5/5 throughout including shoulder flexion/abduction/ER/IR and elbow flexion/extension      THERAPEUTIC EXERCISE: Instructed patient in HEP per below PT education.       PT Education - 11/25/14 1335    Education provided Yes   Education Details HEP: shoulder shrug, scapular retraction, cervical retraction, levator stretch   Person(s) Educated Patient   Methods Explanation;Demonstration;Handout   Comprehension  Verbalized understanding;Returned demonstration             PT Long Term Goals - 11/25/14 1341    PT LONG TERM GOAL #1   Title The patient will be indep with HEP for postural strengthening and flexibility.   Baseline Target date 12/25/2014   Time 4   Period Weeks   PT LONG TERM GOAL #2   Title The patient will return demo sleeping position for support of L UE during night.   Baseline Target date 12/25/2014   Time 4   Period Weeks   PT LONG TERM GOAL #3   Title The patient will decrease neck disability index to < or equal to 12% (from 20%).   Baseline Target date 12/25/2014   Time 4   Period Weeks   PT LONG TERM GOAL #4   Title The patient will demonstrate reaching into overhead cabinet without c/o L  shoulder pain.   Baseline Target date 12/25/2014   Time 4   Period Weeks             Plan - 11/25/14 1347    Clinical Impression Statement The patient is a 59 yo male with h/o CVA with recent worsening of L shoulder and neck discomfort worse when sleeping and during overhead activities.  At today's evaluation, he has WNLs for ROM and strength. He presents with postural weakness with rounded shoulders.  PT to address deficits through HEP and education.   Pt will benefit from skilled therapeutic intervention in order to improve on the following deficits Pain;Postural dysfunction   Rehab Potential Good   PT Frequency 1x / week   PT Duration 4 weeks   PT Treatment/Interventions ADLs/Self Care Home Management;Therapeutic activities;Therapeutic exercise;Manual techniques;Functional mobility training;Patient/family education;Neuromuscular re-education   PT Next Visit Plan Check HEP, posture strengthening, reaching overhead to tolerance   Consulted and Agree with Plan of Care Patient        Problem List Patient Active Problem List   Diagnosis Date Noted  . Neck pain 11/07/2014  . Cerebrovascular accident ([place]) 11/07/2014  . Pain in joint, shoulder region 11/07/2014  . HLD (hyperlipidemia) 11/07/2014  . Essential hypertension 11/07/2014  . Type 2 diabetes mellitus with other circulatory complications ([place]) 16/02/930  . Tobacco use disorder 11/07/2014  . Abdominal hernia 10/20/2014  . Pulmonary artery hypertension ([place]) 10/07/2014  . Gynecomastia 10/07/2014  . Coronary artery calcification seen on CT scan 10/07/2014  . Solitary pulmonary nodule 09/08/2014  . Depression 09/08/2014  . Healthcare maintenance 09/08/2014  . Diastolic dysfunction 35/57/3220  . Left sided lacunar stroke ([place])   . Hyperlipidemia 09/03/2014  . Hypertension 09/03/2014  . Tobacco use 09/03/2014  . OSA (obstructive sleep apnea) 09/03/2014  . Diabetes mellitus with stage 3 chronic kidney disease ([place])  09/03/2002    Jillienne Egner, PT 11/25/2014, 1:46 PM  [facility] [address] [place], [place], [address] Phone: [phone]   Fax:  [phone]

## 2014-11-29 ENCOUNTER — Encounter: Payer: Self-pay | Admitting: Neurology

## 2014-11-29 ENCOUNTER — Ambulatory Visit (INDEPENDENT_AMBULATORY_CARE_PROVIDER_SITE_OTHER): Payer: No Typology Code available for payment source | Admitting: Neurology

## 2014-11-29 VITALS — BP 142/80 | HR 70 | Resp 20 | Ht 72.0 in | Wt 239.0 lb

## 2014-11-29 DIAGNOSIS — E669 Obesity, unspecified: Secondary | ICD-10-CM

## 2014-11-29 DIAGNOSIS — G4733 Obstructive sleep apnea (adult) (pediatric): Secondary | ICD-10-CM | POA: Diagnosis not present

## 2014-11-29 DIAGNOSIS — I6381 Other cerebral infarction due to occlusion or stenosis of small artery: Secondary | ICD-10-CM

## 2014-11-29 DIAGNOSIS — R351 Nocturia: Secondary | ICD-10-CM

## 2014-11-29 DIAGNOSIS — G4719 Other hypersomnia: Secondary | ICD-10-CM

## 2014-11-29 DIAGNOSIS — I639 Cerebral infarction, unspecified: Secondary | ICD-10-CM

## 2014-11-29 NOTE — Progress Notes (Signed)
Subjective:    Patient ID: LANIER MILLON is a 59 y.o. male.  HPI     Star Age, MD, PhD [facility] [address], [address]O. [place], [place] [address]  Dear Cornelius Moras,   I saw your patient, Jayson Waterhouse, upon your kind request in my clinic today for initial consultation of his sleep disorder, in particular, evaluation of a prior diagnosis of OSA. The patient is unaccompanied today. As you know, Mr. Standish is a 59 year old right-handed gentleman with an underlying complex medical history of stroke in 2009, hypertension, hyperlipidemia, diabetes, smoking, carotid artery stenting, recent left-sided lacunar infarct of the left corona radiata and external capsule in July 2016, and obesity, who was previously diagnosed with obstructive sleep apnea and placed on CPAP therapy. He had a sleep study on 06/24/2006 which was interpreted by Dr. Baird Lyons. I reviewed the results. This was a CPAP titration study. Sleep efficiency was 95%. CPAP was titrated to 22 cm with an AHI of 0.8 per hour. He was using a fullface mask medium size. His baseline sleep study from March 2017 had shown a AHI of 79.7 per hour. Average oxygen saturation was 92% during the titration study. During the baseline sleep study, his sleep efficiency was 71%, average oxygen saturation was 90%, nadir was 70%. He has not been using CPAP regularly. He reports thathis CPAP is at least 57 years old and the pressure starts at 14 cm and increases to 22 cm, which is too high for him to tolerate. He had a CPAP re-titration study on 02/20/10, which I reviewed: he was titrated to 20 cm at the time, but as far as he knows, his CPAP pressure never got reduced. He has reduced his smoking to 1 ppd from 2 ppd. At the time of his original CPAP titration study in 2008 he weighed 325 pounds. At the time of his second CPAP titration study he weighed 327 pounds. He has since then lost a lot of weight, in the realm of  100 pounds but gained some back. He is currently at 239 pounds. He reports excessive daytime somnolence and nonrestorative sleep, sleep disruption, and his Epworth sleepiness score is 10 out of 24 today. He has to get up to use the bathroom at least twice per night. He denies restless leg symptoms. He denies morning headaches. Usually he is in bed by 10 PM and his rise time is around 10 AM but sleep is interrupted and nonrestorative.  His Past Medical History Is Significant For: Past Medical History  Diagnosis Date  . CVA (cerebral vascular accident) ([place]) 2009  . Hypertension   . Hyperlipemia   . Diabetes type 2, controlled ([place])     controlled by lifestyle modification  . OSA (obstructive sleep apnea)     His Past Surgical History Is Significant For: Past Surgical History  Procedure Laterality Date  . Appendectomy    . Knee surgery      His Family History Is Significant For: Family History  Problem Relation Age of Onset  . Stroke Father   . Hypertension Father   . Diabetes Father   . Heart failure Father   . Hypertension Mother   . Diabetes Mother   . Heart failure Mother   . Hypertension Sister   . Hypertension Brother   . Diabetes Sister   . Diabetes Brother     His Social History Is Significant For: Social History   Social History  . Marital Status: Married  Spouse Name: N/A  . Number of Children: 1  . Years of Education: 12   Occupational History  . N/A    Social History Main Topics  . Smoking status: Current Every Day Smoker -- 1.00 packs/day for 30 years    Types: Cigarettes  . Smokeless tobacco: None     Comment: 1PACK EVERY 3 DAYS  . Alcohol Use: No     Comment: 2-3 drinks per year  . Drug Use: No  . Sexual Activity: Not Asked   Other Topics Concern  . None   Social History Narrative   Lives alone in [place], [place] (2006)   Drinks about 3 diet sodas a day     His Allergies Are:  No Known Allergies:   His Current Medications Are:   Outpatient Encounter Prescriptions as of 11/29/2014  Medication Sig  . aspirin EC 81 MG tablet Take 81 mg by mouth daily.  Marland Kitchen atorvastatin (LIPITOR) 40 MG tablet Take 1 tablet (40 mg total) by mouth daily at 6 PM.  . citalopram (CELEXA) 40 MG tablet Take 1 tablet (40 mg total) by mouth daily. Take 20 mg (half a tablet) for 1 week then increase to 40 mg daily  . lisinopril (PRINIVIL,ZESTRIL) 10 MG tablet Take 1 tablet (10 mg total) by mouth daily.  . pregabalin (LYRICA) 75 MG capsule Take 1 capsule (75 mg total) by mouth at bedtime as needed (for feet).  . zoster vaccine live, PF, (ZOSTAVAX) 09326 UNT/0.65ML injection Inject 19,400 Units into the skin once.   No facility-administered encounter medications on file as of 11/29/2014.  :  Review of Systems:  Out of a complete 14 point review of systems, all are reviewed and negative with the exception of these symptoms as listed below:   Review of Systems  Constitutional: Positive for fatigue.  Neurological:       Patient states that his last sleep study was about 15 years ago. He was put on CPAP, but stopped using around 10 years ago. He had lost 140lbs.   Patient reports trouble falling and staying asleep, snoring, witnessed apnea, wakes up feeling tired in the morning, takes naps during day, daytime tiredness.     Objective:  Neurologic Exam  Physical Exam Physical Examination:   Filed Vitals:   11/29/14 1443  BP: 142/80  Pulse: 70  Resp: 20   General Examination: The patient is a very pleasant 59 y.o. male in no acute distress. He appears well-developed and well-nourished and adequately groomed.   HEENT: Normocephalic, atraumatic, pupils are equal, round and reactive to light and accommodation. Funduscopic exam is normal with sharp disc margins noted. Extraocular tracking is good without limitation to gaze excursion or nystagmus noted. Normal smooth pursuit is noted. Hearing is grossly intact. Tympanic membranes are clear  bilaterally. Face is symmetric with normal facial animation and normal facial sensation. Speech is clear with no dysarthria noted. There is no hypophonia. There is no lip, neck/head, jaw or voice tremor. Neck is supple with full range of passive and active motion. There are no carotid bruits on auscultation. Oropharynx exam reveals: moderate mouth dryness, adequate dental hygiene and marked airway crowding, due to large tongue, large uvula, redundant soft palate and tonsils in place bilaterally, about 1+. Mallampati is class II. Tongue protrudes centrally and palate elevates symmetrically. Neck circumference is 19-3/8 inches.   Chest: Clear to auscultation without wheezing, rhonchi or crackles noted.  Heart: S1+S2+0, regular and normal without murmurs, rubs or gallops noted.  Abdomen: Soft, non-tender and non-distended with normal bowel sounds appreciated on auscultation.  Extremities: There is no pitting edema in the distal lower extremities bilaterally. Pedal pulses are intact.  Skin: Warm and dry without trophic changes noted. There are no varicose veins.  Musculoskeletal: exam reveals no obvious joint deformities, tenderness or joint swelling or erythema.   Neurologically:  Mental status: The patient is awake, alert and oriented in all 4 spheres. His immediate and remote memory, attention, language skills and fund of knowledge are appropriate. There is no evidence of aphasia, agnosia, apraxia or anomia. Speech is clear with normal prosody and enunciation. Thought process is linear. Mood is normal and affect is normal.  Cranial nerves II - XII are as described above under HEENT exam. In addition: shoulder shrug is normal with equal shoulder height noted. Motor exam: Normal bulk, strength and tone is noted. There is no drift, tremor or rebound. Romberg is negative. Reflexes are 1+ throughout. Fine motor skills are mildly impaired of the left upper extremity. He may have a slight grip weakness in  the left upper extremity. He stands with no significant difficulty and posture is age-appropriate. He walks somewhat cautiously and slower and has trouble with tandem walk.   Assessment and Plan:   In summary, CHRISTINA WALDROP is a very pleasant 59 y.o.-year old male  with an underlying complex medical history of stroke in 2009, hypertension, hyperlipidemia, diabetes, smoking, carotid artery stenting, recent left-sided lacunar infarct of the left corona radiata and external capsule in July 2016, and obesity, who was previously diagnosed with obstructive sleep apnea and placed on CPAP therapy. He could not tolerate CPAP at a high pressure of 22 cm. He stopped using CPAP several years ago. His machine is at least 57 years old. He needs a new machine and reevaluation for sleep apnea and hopefully in light of his weight loss we can titrate him to a pressure that is tolerable to him. His history and physical exam are in keeping with obstructive sleep apnea (OSA). I had a long chat with the patient about my findings and the diagnosis of OSA, its prognosis and treatment options. We talked about medical treatments, surgical interventions and non-pharmacological approaches. I explained in particular the risks and ramifications of untreated moderate to severe OSA, especially with respect to developing cardiovascular disease down the Road, including congestive heart failure, difficult to treat hypertension, cardiac arrhythmias, or stroke. Even type 2 diabetes has, in part, been linked to untreated OSA. Symptoms of untreated OSA include daytime sleepiness, memory problems, mood irritability and mood disorder such as depression and anxiety, lack of energy, as well as recurrent headaches, especially morning headaches. We talked about smoking cessation and trying to maintain a healthy lifestyle in general, as well as the importance of weight control. I encouraged the patient to eat healthy, exercise daily and keep well  hydrated, to keep a scheduled bedtime and wake time routine, to not skip any meals and eat healthy snacks in between meals. I advised the patient not to drive when feeling sleepy. I recommended the following at this time: sleep study with potential positive airway pressure titration. (We will score hypopneas at 3% and split the sleep study into diagnostic and treatment portion, if the estimated. 2 hour AHI is >15/h, unless mandated otherwise by his insurance).   I explained the sleep test procedure to the patient and also outlined possible surgical and non-surgical treatment options of OSA, including the use of a custom-made dental device (which  would require a referral to a specialist dentist or oral surgeon), upper airway surgical options, such as pillar implants, radiofrequency surgery, tongue base surgery, and UPPP (which would involve a referral to an ENT surgeon). Rarely, jaw surgery such as mandibular advancement may be considered.  I also explained the CPAP treatment option to the patient, who indicated that he would be willing to try CPAP if the need arises. I explained the importance of being compliant with PAP treatment, not only for insurance purposes but primarily to improve His symptoms, and for the patient's long term health benefit, including to reduce His cardiovascular risks. I answered all his questions today and the patient was in agreement. I would like to see him back after the sleep study is completed and encouraged him to call with any interim questions, concerns, problems or updates.   Thank you very much for allowing me to participate in the care of this nice patient. If I can be of any further assistance to you please do not hesitate to talk to me.   Sincerely,   Star Age, MD, PhD

## 2014-11-29 NOTE — Patient Instructions (Signed)
Based on your symptoms and your exam I believe you are still at risk for obstructive sleep apnea or OSA, and I think we should proceed with a sleep study to determine whether you do or do not have OSA and how severe it is. If you have more than mild OSA, I want you to consider treatment with CPAP. Please remember, the risks and ramifications of moderate to severe obstructive sleep apnea or OSA are: Cardiovascular disease, including congestive heart failure, stroke, difficult to control hypertension, arrhythmias, and even type 2 diabetes has been linked to untreated OSA. Sleep apnea causes disruption of sleep and sleep deprivation in most cases, which, in turn, can cause recurrent headaches, problems with memory, mood, concentration, focus, and vigilance. Most people with untreated sleep apnea report excessive daytime sleepiness, which can affect their ability to drive. Please do not drive if you feel sleepy.   I will likely see you back after your sleep study to go over the test results and where to go from there. We will call you after your sleep study to advise about the results (most likely, you will hear from Beverlee Nims, my nurse) and to set up an appointment at the time, as necessary.    Our sleep lab administrative assistant, Arrie Aran will meet with you or call you to schedule your sleep study. If you don't hear back from her by next week please feel free to call her at [phone]. This is her direct line and please leave a message with your phone number to call back if you get the voicemail box. She will call back as soon as possible.   Please stop smoking!

## 2014-12-01 ENCOUNTER — Ambulatory Visit: Payer: No Typology Code available for payment source | Admitting: Rehabilitative and Restorative Service Providers"

## 2014-12-01 DIAGNOSIS — M542 Cervicalgia: Secondary | ICD-10-CM

## 2014-12-01 NOTE — Therapy (Signed)
[facility] [address] [place], [place], [address] Phone: [phone]   Fax:  [phone]  Physical Therapy Treatment  Patient Details  Name: [name] MRN: [id number] Date of Birth: 1955-11-14 Referring Provider:  [name], MD  Encounter Date: 12/01/2014      PT End of Session - 12/01/14 0823    Visit Number 2   Number of Visits 4   Date for PT Re-Evaluation 12/25/14   Authorization Type private insurance 20 visit limit   Authorization Time Period * no charge for today, patient arrived reporting no limitations in functional activities   PT Start Time 0805   PT Stop Time 0810   PT Time Calculation (min) 5 min      Past Medical History  Diagnosis Date  . CVA (cerebral vascular accident) ([place]) 2009  . Hypertension   . Hyperlipemia   . Diabetes type 2, controlled ([place])     controlled by lifestyle modification  . OSA (obstructive sleep apnea)     Past Surgical History  Procedure Laterality Date  . Appendectomy    . Knee surgery      There were no vitals filed for this visit.  Visit Diagnosis:  Neck pain      Subjective Assessment - 12/01/14 0808    Subjective The patient reports that he has not had any pain this week and is sleeping better after beginning home exercise program.  He thinks that the sleeping positions and stretches have significantly reduced pain.    He was seen this week for sleep apnea and is working with neurologist to treat.     Patient Stated Goals reduce pain at night   Currently in Pain? No/denies             PT Education - 12/01/14 0822    Education provided Yes   Education Details continuation of HEP   Person(s) Educated Patient   Methods Explanation   Comprehension Verbalized understanding           PT Long Term Goals - 12/01/14 0809    PT LONG TERM GOAL #1   Title The patient will be indep with HEP for postural strengthening and flexibility.    Baseline Target date 12/25/2014   Time 4   Period Weeks   Status Achieved   PT LONG TERM GOAL #2   Title The patient will return demo sleeping position for support of L UE during night.   Baseline Target date 12/25/2014   Time 4   Period Weeks   Status Achieved   PT LONG TERM GOAL #3   Title The patient will decrease neck disability index to < or equal to 12% (from 20%).   Baseline Pt met on 12/01/2014 scoring 0% on neck disability index.   Time 4   Period Weeks   Status Achieved   PT LONG TERM GOAL #4   Title The patient will demonstrate reaching into overhead cabinet without c/o L shoulder pain.   Baseline Target date 12/25/2014   Time 4   Period Weeks   Status Achieved               Plan - 12/01/14 5056    Clinical Impression Statement Patient arrived to PT today reporting no pain or difficulty sleeping since beginning home exercises from initial evaluation and modifying his sleeping positions as reviewed in PT eval.  He is without pain or functional limitations and is able to be discharged today  with instruction to continue current stretches and exercises.    PT Next Visit Plan Discharge   Consulted and Agree with Plan of Care Patient     PHYSICAL THERAPY DISCHARGE SUMMARY  Visits from Start of Care: 2  Current functional level related to goals / functional outcomes: See above goals   Remaining deficits: none   Education / Equipment: HEP, home sleeping positions  Plan: Patient agrees to discharge.  Patient goals were met. Patient is being discharged due to meeting the stated rehab goals.  ????? Thank you for the referral of this patient. Rudell Cobb, MPT        Problem List Patient Active Problem List   Diagnosis Date Noted  . Neck pain 11/07/2014  . Cerebrovascular accident ([place]) 11/07/2014  . Pain in joint, shoulder region 11/07/2014  . HLD (hyperlipidemia) 11/07/2014  . Essential hypertension 11/07/2014  . Type 2 diabetes mellitus with  other circulatory complications ([place]) 99/77/4142  . Tobacco use disorder 11/07/2014  . Abdominal hernia 10/20/2014  . Pulmonary artery hypertension ([place]) 10/07/2014  . Gynecomastia 10/07/2014  . Coronary artery calcification seen on CT scan 10/07/2014  . Solitary pulmonary nodule 09/08/2014  . Depression 09/08/2014  . Healthcare maintenance 09/08/2014  . Diastolic dysfunction 39/53/2023  . Left sided lacunar stroke ([place])   . Hyperlipidemia 09/03/2014  . Hypertension 09/03/2014  . Tobacco use 09/03/2014  . OSA (obstructive sleep apnea) 09/03/2014  . Diabetes mellitus with stage 3 chronic kidney disease ([place]) 09/03/2002    [place], PT 12/01/2014, 9:33 AM  [facility] [address] [place], [place], [address] Phone: [phone]   Fax:  [phone]

## 2014-12-08 ENCOUNTER — Ambulatory Visit: Payer: No Typology Code available for payment source | Admitting: Rehabilitative and Restorative Service Providers"

## 2014-12-15 ENCOUNTER — Ambulatory Visit (INDEPENDENT_AMBULATORY_CARE_PROVIDER_SITE_OTHER): Payer: No Typology Code available for payment source | Admitting: Neurology

## 2014-12-15 DIAGNOSIS — G4733 Obstructive sleep apnea (adult) (pediatric): Secondary | ICD-10-CM

## 2014-12-15 DIAGNOSIS — R9431 Abnormal electrocardiogram [ECG] [EKG]: Secondary | ICD-10-CM

## 2014-12-16 NOTE — Sleep Study (Signed)
Please see the scanned sleep study interpretation located in the procedure tab within the chart review section.   

## 2014-12-21 ENCOUNTER — Ambulatory Visit (AMBULATORY_SURGERY_CENTER): Payer: Self-pay | Admitting: *Deleted

## 2014-12-21 ENCOUNTER — Telehealth: Payer: Self-pay | Admitting: Neurology

## 2014-12-21 VITALS — Ht 71.0 in | Wt 242.0 lb

## 2014-12-21 DIAGNOSIS — Z1211 Encounter for screening for malignant neoplasm of colon: Secondary | ICD-10-CM

## 2014-12-21 DIAGNOSIS — G4733 Obstructive sleep apnea (adult) (pediatric): Secondary | ICD-10-CM

## 2014-12-21 NOTE — Progress Notes (Signed)
Patient's wife at side during pre-visit. Patient denies any allergies to eggs or soy. Patient denies any problems with anesthesia/sedation. Patient denies any oxygen use at home and does not take any diet/weight loss medications. Pt declined emmi information today.

## 2014-12-21 NOTE — Telephone Encounter (Signed)
Diana:  Patient referred by Dr. Erlinda Hong, seen by me on 11/29/14, split study on 12/15/14, Ins: [employer]. Please call and notify patient that the recent sleep study confirmed the diagnosis of severe OSA. He did very well with CPAP during the study with significant improvement of the respiratory events. Therefore, I would like start the patient on CPAP therapy at home by prescribing a machine for home use. I placed the order in the chart. The patient will need a follow up appointment with me in 8 to 10 weeks post set up that has to be scheduled; please go ahead and schedule while you have the patient on the phone and make sure patient understands the importance of keeping this window for the FU appointment, as it is often an insurance requirement and failing to adhere to this may result in losing coverage for sleep apnea treatment.  Please re-enforce the importance of compliance with treatment and the need for [place] to monitor compliance data - again an insurance requirement and good feedback for the patient as far as how they are doing.  Also remind patient, that any upcoming CPAP machine or mask issues, should be first addressed with the DME company. Please ask if patient has a preference regarding DME company.  Please arrange for CPAP set up at home through a DME company of patient's choice - once you have spoken to the patient - and faxed/routed report to PCP and referring MD (if other than PCP), you can close this encounter, thanks,   Star Age, MD, PhD [facility] ([employer])

## 2014-12-22 NOTE — Telephone Encounter (Signed)
I spoke to patient and gave him results of study and recommendations. He is willing to proceed with treatment. I will send out referral and fax report to PCP/ copy to Dr. Erlinda Hong. I will send patient letter to remind him to make appt and stress importance of compliance.

## 2015-01-04 ENCOUNTER — Encounter: Payer: Self-pay | Admitting: Internal Medicine

## 2015-01-04 ENCOUNTER — Ambulatory Visit (AMBULATORY_SURGERY_CENTER): Payer: No Typology Code available for payment source | Admitting: Internal Medicine

## 2015-01-04 VITALS — BP 156/67 | HR 64 | Temp 96.8°F | Resp 16 | Ht 71.0 in | Wt 242.0 lb

## 2015-01-04 DIAGNOSIS — Z1211 Encounter for screening for malignant neoplasm of colon: Secondary | ICD-10-CM | POA: Diagnosis not present

## 2015-01-04 DIAGNOSIS — D124 Benign neoplasm of descending colon: Secondary | ICD-10-CM

## 2015-01-04 DIAGNOSIS — D122 Benign neoplasm of ascending colon: Secondary | ICD-10-CM

## 2015-01-04 MED ORDER — SODIUM CHLORIDE 0.9 % IV SOLN: 500.0000 mL | INTRAVENOUS | Status: DC

## 2015-01-04 NOTE — Progress Notes (Signed)
Patient awakening,vss,report to rn 

## 2015-01-04 NOTE — Patient Instructions (Addendum)
I found and removed 2 small polyps that look benign. I will let you know pathology results and when to have another routine colonoscopy by mail.  You also have a condition called diverticulosis - common and not usually a problem. Please read the handout provided.  I appreciate the opportunity to care for you. Gatha Mayer, MD, FACG   YOU HAD AN ENDOSCOPIC PROCEDURE TODAY AT [facility]:   Refer to the procedure report that was given to you for any specific questions about what was found during the examination.  If the procedure report does not answer your questions, please call your gastroenterologist to clarify.  If you requested that your care partner not be given the details of your procedure findings, then the procedure report has been included in a sealed envelope for you to review at your convenience later.  YOU SHOULD EXPECT: Some feelings of bloating in the abdomen. Passage of more gas than usual.  Walking can help get rid of the air that was put into your GI tract during the procedure and reduce the bloating. If you had a lower endoscopy (such as a colonoscopy or flexible sigmoidoscopy) you may notice spotting of blood in your stool or on the toilet paper. If you underwent a bowel prep for your procedure, you may not have a normal bowel movement for a few days.  Please Note:  You might notice some irritation and congestion in your nose or some drainage.  This is from the oxygen used during your procedure.  There is no need for concern and it should clear up in a day or so.  SYMPTOMS TO REPORT IMMEDIATELY:   Following lower endoscopy (colonoscopy or flexible sigmoidoscopy):  Excessive amounts of blood in the stool  Significant tenderness or worsening of abdominal pains  Swelling of the abdomen that is new, acute  Fever of 100F or higher   For urgent or emergent issues, a gastroenterologist can be reached at any hour by calling [phone].   DIET:  Your first meal following the procedure should be a small meal and then it is ok to progress to your normal diet. Heavy or fried foods are harder to digest and may make you feel nauseous or bloated.  Likewise, meals heavy in dairy and vegetables can increase bloating.  Drink plenty of fluids but you should avoid alcoholic beverages for 24 hours.  ACTIVITY:  You should plan to take it easy for the rest of today and you should NOT DRIVE or use heavy machinery until tomorrow (because of the sedation medicines used during the test).    FOLLOW UP: Our staff will call the number listed on your records the next business day following your procedure to check on you and address any questions or concerns that you may have regarding the information given to you following your procedure. If we do not reach you, we will leave a message.  However, if you are feeling well and you are not experiencing any problems, there is no need to return our call.  We will assume that you have returned to your regular daily activities without incident.  If any biopsies were taken you will be contacted by phone or by letter within the next 1-3 weeks.  Please call us at [phone] if you have not heard about the biopsies in 3 weeks.    SIGNATURES/CONFIDENTIALITY: You and/or your care partner have signed paperwork which will be entered into your electronic medical record.  These signatures attest to the fact that that the information above on your After Visit Summary has been reviewed and is understood.  Full responsibility of the confidentiality of this discharge information lies with you and/or your care-partner.

## 2015-01-04 NOTE — Progress Notes (Signed)
Called to room to assist during endoscopic procedure.  Patient ID and intended procedure confirmed with present staff. Received instructions for my participation in the procedure from the performing physician.  

## 2015-01-04 NOTE — Op Note (Signed)
[place]  [address] [place], [id number]   COLONOSCOPY PROCEDURE REPORT  PATIENT: [name], [name]  MR#: [id number] BIRTHDATE: [birth year] , 59  yrs. old GENDER: male ENDOSCOPIST: Gatha Mayer, MD, [facility] PROCEDURE DATE:  01/04/2015 PROCEDURE:   Colonoscopy, screening and Colonoscopy with snare polypectomy First Screening Colonoscopy - Avg.  risk and is 50 yrs.  old or older Yes.  Prior Negative Screening - Now for repeat screening. N/A  History of Adenoma - Now for follow-up colonoscopy & has been > or = to 3 yrs.  N/A  Polyps removed today? Yes ASA CLASS:   Class III INDICATIONS:Screening for colonic neoplasia and Colorectal Neoplasm Risk Assessment for this procedure is average risk. MEDICATIONS: Propofol 200 mg IV  DESCRIPTION OF PROCEDURE:   After the risks benefits and alternatives of the procedure were thoroughly explained, informed consent was obtained.  The digital rectal exam revealed no abnormalities of the rectum, revealed the prostate was not enlarged, and revealed no prostatic nodules.   The LB [id number]  endoscope was introduced through the anus and advanced to the cecum, which was identified by both the appendix and ileocecal valve. No adverse events experienced.   The quality of the prep was excellent.  (MiraLax was used)  The instrument was then slowly withdrawn as the colon was fully examined. Estimated blood loss is zero unless otherwise noted in this procedure report.   COLON FINDINGS: Two polypoid shaped sessile polyps ranging from 5 to 62mm in size were found in the descending colon and ascending colon. Polypectomies were performed with a cold snare.  The resection was complete, the polyp tissue was completely retrieved and sent to histology.   There was diverticulosis noted in the sigmoid colon. The examination was otherwise normal.  Retroflexed views revealed no abnormalities. The time to cecum = 2.1 Withdrawal time =  12.7 The scope was withdrawn and the procedure completed. COMPLICATIONS: There were no immediate complications.  ENDOSCOPIC IMPRESSION: 1.   Two sessile polyps ranging from 5 to 1mm in size were found in the descending colon and ascending colon; polypectomies were performed with a cold snare 2.   Diverticulosis was noted in the sigmoid colon 3.   The examination was otherwise normal  RECOMMENDATIONS: Timing of repeat colonoscopy will be determined by pathology findings.  eSigned:  Gatha Mayer, MD, [facility] 01/04/2015 8:46 AM   cc: The Patient and Dr. [name]

## 2015-01-05 ENCOUNTER — Telehealth: Payer: Self-pay | Admitting: Internal Medicine

## 2015-01-05 NOTE — Telephone Encounter (Signed)
°  Follow up Call-  Call back number 01/04/2015  Post procedure Call Back phone  # [phone]  Permission to leave phone message Yes    Patient was called at number given. No answer. Left message on voice mail.

## 2015-01-15 ENCOUNTER — Encounter: Payer: Self-pay | Admitting: Internal Medicine

## 2015-01-15 DIAGNOSIS — Z860101 Personal history of adenomatous and serrated colon polyps: Secondary | ICD-10-CM

## 2015-01-15 DIAGNOSIS — Z86010 Personal history of colonic polyps: Secondary | ICD-10-CM

## 2015-01-15 HISTORY — DX: Personal history of colonic polyps: Z86.010

## 2015-01-15 HISTORY — DX: Personal history of adenomatous and serrated colon polyps: Z86.0101

## 2015-01-15 NOTE — Progress Notes (Signed)
Quick Note:  2 adenomas max 7 mm Repeat colonoscopy 2021-22 ______

## 2015-01-25 ENCOUNTER — Encounter: Payer: Self-pay | Admitting: Internal Medicine

## 2015-01-25 ENCOUNTER — Ambulatory Visit (INDEPENDENT_AMBULATORY_CARE_PROVIDER_SITE_OTHER): Payer: No Typology Code available for payment source | Admitting: Internal Medicine

## 2015-01-25 VITALS — BP 137/64 | HR 66 | Temp 98.4°F | Ht 72.0 in | Wt 240.9 lb

## 2015-01-25 DIAGNOSIS — Z23 Encounter for immunization: Secondary | ICD-10-CM

## 2015-01-25 DIAGNOSIS — E1122 Type 2 diabetes mellitus with diabetic chronic kidney disease: Secondary | ICD-10-CM | POA: Diagnosis not present

## 2015-01-25 DIAGNOSIS — I10 Essential (primary) hypertension: Secondary | ICD-10-CM

## 2015-01-25 DIAGNOSIS — Z0000 Encounter for general adult medical examination without abnormal findings: Secondary | ICD-10-CM

## 2015-01-25 DIAGNOSIS — F32A Depression, unspecified: Secondary | ICD-10-CM

## 2015-01-25 DIAGNOSIS — N183 Chronic kidney disease, stage 3 unspecified: Secondary | ICD-10-CM

## 2015-01-25 DIAGNOSIS — K439 Ventral hernia without obstruction or gangrene: Secondary | ICD-10-CM

## 2015-01-25 DIAGNOSIS — F418 Other specified anxiety disorders: Secondary | ICD-10-CM

## 2015-01-25 DIAGNOSIS — F329 Major depressive disorder, single episode, unspecified: Secondary | ICD-10-CM

## 2015-01-25 DIAGNOSIS — K469 Unspecified abdominal hernia without obstruction or gangrene: Secondary | ICD-10-CM

## 2015-01-25 DIAGNOSIS — I69351 Hemiplegia and hemiparesis following cerebral infarction affecting right dominant side: Secondary | ICD-10-CM

## 2015-01-25 DIAGNOSIS — I129 Hypertensive chronic kidney disease with stage 1 through stage 4 chronic kidney disease, or unspecified chronic kidney disease: Secondary | ICD-10-CM

## 2015-01-25 DIAGNOSIS — E1159 Type 2 diabetes mellitus with other circulatory complications: Secondary | ICD-10-CM

## 2015-01-25 DIAGNOSIS — I6381 Other cerebral infarction due to occlusion or stenosis of small artery: Secondary | ICD-10-CM

## 2015-01-25 DIAGNOSIS — G4733 Obstructive sleep apnea (adult) (pediatric): Secondary | ICD-10-CM

## 2015-01-25 DIAGNOSIS — Z9989 Dependence on other enabling machines and devices: Secondary | ICD-10-CM

## 2015-01-25 DIAGNOSIS — Z720 Tobacco use: Secondary | ICD-10-CM

## 2015-01-25 DIAGNOSIS — I69322 Dysarthria following cerebral infarction: Secondary | ICD-10-CM

## 2015-01-25 DIAGNOSIS — F17210 Nicotine dependence, cigarettes, uncomplicated: Secondary | ICD-10-CM

## 2015-01-25 LAB — GLUCOSE, CAPILLARY: GLUCOSE-CAPILLARY: 80 mg/dL (ref 65–99)

## 2015-01-25 LAB — POCT GLYCOSYLATED HEMOGLOBIN (HGB A1C): HEMOGLOBIN A1C: 5.4

## 2015-01-25 MED ORDER — NICOTINE POLACRILEX 4 MG MT GUM: 4.0000 mg | CHEWING_GUM | OROMUCOSAL | Status: DC | PRN

## 2015-01-25 MED ORDER — NICOTINE 14 MG/24HR TD PT24: 14.0000 mg | MEDICATED_PATCH | TRANSDERMAL | Status: DC

## 2015-01-25 MED ORDER — BUSPIRONE HCL 7.5 MG PO TABS: 7.5000 mg | ORAL_TABLET | Freq: Two times a day (BID) | ORAL | Status: DC

## 2015-01-25 MED ORDER — PNEUMOCOCCAL VAC POLYVALENT 25 MCG/0.5ML IJ INJ
0.5000 mL | INJECTION | INTRAMUSCULAR | Status: AC
  Administered 2015-01-25: 0.5 mL via INTRAMUSCULAR

## 2015-01-25 NOTE — Patient Instructions (Signed)
Thank you for coming in today   Continue to work on quitting smoking. I have giving your a script for nicotine patches and gum. Call 1-800-QUIT-NOW for more resources.  I am giving you a prescription for a new medication called Buspar. It is a twice a day medication.  I would like to see you back in 1-2 months for follow up.

## 2015-01-25 NOTE — Progress Notes (Signed)
Patient ID: [name], male   DOB: 04/05/1955, 59 y.o.   MRN: [id number]   Subjective:   Patient ID: [name] male   DOB: 12-07-1955 59 y.o.   MRN: [id number]  HPI: [name] is a 59 y.o. male with a past medical history listed below here today for follow up of his HTN, DM, and tobacco abuse.   For details of today's visit and the status of his chronic medical issues please refer to the assessment and plan.  Past Medical History  Diagnosis Date  . Hypertension   . Hyperlipemia   . Diabetes type 2, controlled ([place])     controlled by lifestyle modification  . OSA (obstructive sleep apnea)   . CVA (cerebral vascular accident) [facility]) 2009,& September 03, 2014  . Hx of adenomatous colonic polyps 01/15/2015  . Cough   . Shortness of breath dyspnea   . Depression   . Anxiety   . Chronic kidney disease     abn labs  . Arthritis    Current Outpatient Prescriptions  Medication Sig Dispense Refill  . aspirin EC 81 MG tablet Take 81 mg by mouth daily.    Marland Kitchen atorvastatin (LIPITOR) 40 MG tablet Take 1 tablet (40 mg total) by mouth daily at 6 PM. 90 tablet 3  . busPIRone (BUSPAR) 7.5 MG tablet Take 1 tablet (7.5 mg total) by mouth 2 (two) times daily. 60 tablet 2  . citalopram (CELEXA) 40 MG tablet Take 1 tablet (40 mg total) by mouth daily. Take 20 mg (half a tablet) for 1 week then increase to 40 mg daily 90 tablet 1  . lisinopril (PRINIVIL,ZESTRIL) 10 MG tablet Take 1 tablet (10 mg total) by mouth daily. 90 tablet 3  . nicotine (NICODERM CQ - DOSED IN MG/24 HOURS) 14 mg/24hr patch Place 1 patch (14 mg total) onto the skin daily. 30 patch 2  . nicotine polacrilex (NICORETTE) 4 MG gum Take 1 each (4 mg total) by mouth as needed for smoking cessation. 100 tablet 2  . pregabalin (LYRICA) 75 MG capsule Take 1 capsule (75 mg total) by mouth at bedtime as needed (for feet). 90 capsule 3   No current facility-administered medications for this visit.   Family History    Problem Relation Age of Onset  . Stroke Father   . Hypertension Father   . Diabetes Father   . Heart failure Father   . Colon polyps Father   . Hypertension Mother   . Diabetes Mother   . Heart failure Mother   . Colon polyps Mother   . Hypertension Sister   . Hypertension Brother   . Diabetes Sister   . Diabetes Brother   . Colon cancer Neg Hx    Social History   Social History  . Marital Status: Married    Spouse Name: N/A  . Number of Children: 1  . Years of Education: 12   Occupational History  . N/A    Social History Main Topics  . Smoking status: Current Every Day Smoker -- 1.00 packs/day for 30 years    Types: Cigarettes  . Smokeless tobacco: Never Used  . Alcohol Use: No     Comment: 2-3 drinks per year  . Drug Use: No  . Sexual Activity: Not Asked   Other Topics Concern  . None   Social History Narrative   Lives alone in [place], [place] (2006)   Drinks about 3 diet sodas a day    Review  of Systems: Review of Systems  Constitutional: Negative for fever, chills and malaise/fatigue.  Eyes: Negative for blurred vision and double vision.  Respiratory: Negative for shortness of breath.   Cardiovascular: Negative for chest pain.  Gastrointestinal: Negative for nausea, vomiting and abdominal pain.  Neurological: Negative for dizziness, sensory change, speech change, focal weakness and headaches.  Psychiatric/Behavioral: The patient is nervous/anxious.    Objective:  Physical Exam: Filed Vitals:   01/25/15 1547  BP: 137/64  Pulse: 66  Temp: 98.4 F (36.9 C)  TempSrc: Oral  Height: 6' (1.829 m)  Weight: 240 lb 14.4 oz (109.272 kg)  SpO2: 100%   PHYSICAL EXAM GENERAL- alert, co-operative, appears as stated age, not in any distress. HEENT- Atraumatic, normocephalic, PERRL, EOMI, oral mucosa appears moist CARDIAC- RRR, no murmurs, rubs or gallops. RESP- Moving equal volumes of air, and clear to auscultation bilaterally, no wheezes or crackles. ABDOMEN-  Soft, nontender, bowel sounds present. NEURO- No obvious Cr N abnormality. EXTREMITIES- pulse 2+, symmetric, no pedal edema. SKIN- Warm, dry, No rash or lesion. PSYCH- Depressed mood and affect, appropriate thought content and speech.  Assessment & Plan:   Case discussed with Dr. Evette Doffing. Please refer to Problem based charting for further details of today's visit.

## 2015-01-26 ENCOUNTER — Encounter (HOSPITAL_COMMUNITY): Payer: Self-pay

## 2015-01-26 ENCOUNTER — Encounter (HOSPITAL_COMMUNITY)
Admission: RE | Admit: 2015-01-26 | Discharge: 2015-01-26 | Disposition: A | Discharge status: Home / Self Care | Payer: No Typology Code available for payment source | Source: Ambulatory Visit | Attending: Surgery | Admitting: Surgery

## 2015-01-26 DIAGNOSIS — I129 Hypertensive chronic kidney disease with stage 1 through stage 4 chronic kidney disease, or unspecified chronic kidney disease: Secondary | ICD-10-CM | POA: Insufficient documentation

## 2015-01-26 DIAGNOSIS — Z7982 Long term (current) use of aspirin: Secondary | ICD-10-CM | POA: Insufficient documentation

## 2015-01-26 DIAGNOSIS — F17210 Nicotine dependence, cigarettes, uncomplicated: Secondary | ICD-10-CM | POA: Insufficient documentation

## 2015-01-26 DIAGNOSIS — N189 Chronic kidney disease, unspecified: Secondary | ICD-10-CM | POA: Diagnosis not present

## 2015-01-26 DIAGNOSIS — Z01812 Encounter for preprocedural laboratory examination: Secondary | ICD-10-CM | POA: Insufficient documentation

## 2015-01-26 DIAGNOSIS — Z79899 Other long term (current) drug therapy: Secondary | ICD-10-CM | POA: Diagnosis not present

## 2015-01-26 DIAGNOSIS — G4733 Obstructive sleep apnea (adult) (pediatric): Secondary | ICD-10-CM | POA: Insufficient documentation

## 2015-01-26 DIAGNOSIS — E1122 Type 2 diabetes mellitus with diabetic chronic kidney disease: Secondary | ICD-10-CM | POA: Diagnosis not present

## 2015-01-26 DIAGNOSIS — E785 Hyperlipidemia, unspecified: Secondary | ICD-10-CM | POA: Insufficient documentation

## 2015-01-26 DIAGNOSIS — Z01818 Encounter for other preprocedural examination: Secondary | ICD-10-CM | POA: Insufficient documentation

## 2015-01-26 DIAGNOSIS — Z8673 Personal history of transient ischemic attack (TIA), and cerebral infarction without residual deficits: Secondary | ICD-10-CM | POA: Insufficient documentation

## 2015-01-26 DIAGNOSIS — R1030 Lower abdominal pain, unspecified: Secondary | ICD-10-CM | POA: Insufficient documentation

## 2015-01-26 HISTORY — DX: Chronic kidney disease, unspecified: N18.9

## 2015-01-26 HISTORY — DX: Major depressive disorder, single episode, unspecified: F32.9

## 2015-01-26 HISTORY — DX: Cough, unspecified: R05.9

## 2015-01-26 HISTORY — DX: Cough: R05

## 2015-01-26 HISTORY — DX: Reserved for inherently not codable concepts without codable children: IMO0001

## 2015-01-26 HISTORY — DX: Depression, unspecified: F32.A

## 2015-01-26 HISTORY — DX: Anxiety disorder, unspecified: F41.9

## 2015-01-26 HISTORY — DX: Unspecified osteoarthritis, unspecified site: M19.90

## 2015-01-26 LAB — BASIC METABOLIC PANEL
ANION GAP: 5 (ref 5–15)
BUN: 21 mg/dL — ABNORMAL HIGH (ref 6–20)
CALCIUM: 9.4 mg/dL (ref 8.9–10.3)
CHLORIDE: 107 mmol/L (ref 101–111)
CO2: 28 mmol/L (ref 22–32)
Creatinine, Ser: 1.64 mg/dL — ABNORMAL HIGH (ref 0.61–1.24)
GFR calc non Af Amer: 45 mL/min — ABNORMAL LOW (ref >60)
GFR, EST AFRICAN AMERICAN: 52 mL/min — AB (ref >60)
Glucose, Bld: 123 mg/dL — ABNORMAL HIGH (ref 65–99)
Potassium: 5.4 mmol/L — ABNORMAL HIGH (ref 3.5–5.1)
SODIUM: 140 mmol/L (ref 135–145)

## 2015-01-26 LAB — CBC
HEMATOCRIT: 48.5 % (ref 39.0–52.0)
HEMOGLOBIN: 15.7 g/dL (ref 13.0–17.0)
MCH: 30.7 pg (ref 26.0–34.0)
MCHC: 32.4 g/dL (ref 30.0–36.0)
MCV: 94.9 fL (ref 78.0–100.0)
Platelets: 195 10*3/uL (ref 150–400)
RBC: 5.11 MIL/uL (ref 4.22–5.81)
RDW: 13.3 % (ref 11.5–15.5)
WBC: 9.1 10*3/uL (ref 4.0–10.5)

## 2015-01-26 LAB — GLUCOSE, CAPILLARY: GLUCOSE-CAPILLARY: 60 mg/dL — AB (ref 65–99)

## 2015-01-26 NOTE — Pre-Procedure Instructions (Signed)
    [name]  01/26/2015      [facility] [place], [place] - [id number] [place] [address] Phone: [phone] Fax: [phone]    Your procedure is scheduled on 02/06/15.  Report to [facility] Admitting at 1030 A.M.  Call this number if you have problems the morning of surgery:  [phone]   Remember:  Do not eat food or drink liquids after midnight.  Take these medicines the morning of surgery with A SIP OF WATER --celexa,patch   Do not wear jewelry, make-up or nail polish.  Do not wear lotions, powders, or perfumes.  You may wear deodorant.  Do not shave 48 hours prior to surgery.  Men may shave face and neck.  Do not bring valuables to the hospital.  [facility] is not responsible for any belongings or valuables.  Contacts, dentures or bridgework may not be worn into surgery.  Leave your suitcase in the car.  After surgery it may be brought to your room.  For patients admitted to the hospital, discharge time will be determined by your treatment team.  Patients discharged the day of surgery will not be allowed to drive home.   Name and phone number of your driver:   Special instructions:   Please read over the following fact sheets that you were given. Pain Booklet, Coughing and Deep Breathing and Surgical Site Infection Prevention

## 2015-01-27 NOTE — Progress Notes (Addendum)
Anesthesia Chart Review:  Pt is 59 year old male scheduled for laparoscopic ventral wall hernia repair with mesh on 02/06/2015 with Dr. Johney Maine.   Neurologist is Dr. Rosalin Hawking, last office visit 01/30/15, f/u in 6 months. PCP is Dr. [name] at [facility]; Dr. Charlynn Grimes is aware of upcoming surgery.   PMH includes:  HTN, DM, hyperlipidemia, CVA (2009 and 08/2014), CKD, OSA. Current smoker. BMI 32.5  Medications include: ASA, lipitor, lisinopril.   Preoperative labs reviewed.  K 5.4. Will recheck DOS.  Cr 1.64, BUN 21.   Chest CT 10/04/14:  1. Similar appearance of small pulmonary nodules, consistent with a benign etiology. 2. Mild centrilobular emphysema. 3. Age advanced coronary artery atherosclerosis. Recommend assessment of coronary risk factors and consideration of medical therapy. 4. Mild pulmonary artery enlargement suggests pulmonary arterial hypertension. 5. Mild left-sided gynecomastia.  EKG 09/03/14: Sinus rhythm. Left atrial enlargement. Baseline wander in lead(s) III V1  Carotid duplex US 09/04/14: Bilateral: intimal wall thickening CCA. Mild mixed plaque origin and proximal ICA and ECA. 1-39% ICA stenosis. Vertebral artery flow is antegrade.  Echo 09/04/14:  - Left ventricle: The cavity size was normal. Wall thickness was increased in a pattern of mild LVH. Systolic function was normal. The estimated ejection fraction was in the range of 60% to 65%. Wall motion was normal; there were no regional wall motion abnormalities. Doppler parameters are consistent with abnormal left ventricular relaxation (grade 1 diastolic dysfunction). The E/e&' ratio is >15, suggesting elevated LV filling pressure. - Aortic valve: Trileaflet. The left coronary cusp appears to be immobile. There was no stenosis. There was no regurgitation. - Mitral valve: Calcified annulus. Calcified chordae. Trivial MR. - Left atrium: Moderately dilated at 42 ml/m2. - Right atrium: Mildly dilated  at 22 cm2. - Inferior vena cava: The vessel was normal in size. The respirophasic diameter changes were in the normal range (>= 50%), consistent with normal central venous pressure. - Impressions: LVEF 60-65%, mild LVH, diastolic dysfunction, elevated LV fillingpressure, immobile left coronary cusp of the aortic valve(without significant stenosis), MAC with trivial MR, moderateLAE, mild RAE, normal IVC.  If labs acceptable DOS, I anticipate pt can proceed as scheduled.   Willeen Cass, FNP-BC [facility]Anesthesiology Phone: [phone] 01/30/2015 4:33 PM

## 2015-01-29 NOTE — Assessment & Plan Note (Signed)
BP Readings from Last 3 Encounters:  01/26/15 123/56  01/25/15 137/64  01/04/15 156/67    Lab Results  Component Value Date   NA 140 01/26/2015   K 5.4* 01/26/2015   CREATININE 1.64* 01/26/2015    Assessment: BP 137/64 today. Currently on Lisinopril 10 mg daily. K 5.4 on preadmission screening 12/8. No symptoms noted at screening visit or during office visit.   Plan: Will continue current medications Plans for repeat BMP day of surgery (12/9) per pre-admission note.  If K remains elevated will consider decreasing Lisinopril to 5 mg daily and adding low dose HCTZ.

## 2015-01-29 NOTE — Assessment & Plan Note (Signed)
  Assessment: Continues to smoke 1/2 PPD with no improvement. Reports habit is his biggest barrier. Discussed the importance of smoking cessation especially given his CVA history.  Plan: Given Rx for Nicotine patches and gum. Provided information for 1-800-QUIT NOW.

## 2015-01-29 NOTE — Assessment & Plan Note (Signed)
Lab Results  Component Value Date   HGBA1C 5.4 01/25/2015   HGBA1C 5.9* 09/04/2014   HGBA1C 6.0* 09/03/2014     Assessment: HgbA1c 5.4 today. Currently diet controlled. Denies any hypoglycemia.   Plan: -Continue lifestyle modifications and encourage weight loss with diet and exercise -Microalb/cr wnl at last visit -DM eye exam with no retinopathy

## 2015-01-29 NOTE — Assessment & Plan Note (Signed)
Hernia repair scheduled for 12/19

## 2015-01-29 NOTE — Assessment & Plan Note (Signed)
Remains with right hand weakness and mild dysarthria. Continues to smoke 1/2 PPD. Continue to work on risk factor modification.   Plan -Continue aspirin 81 mg daily  -BP at goal <140/90, continue lisinopril 10 mg daily  -Continue atorvastatin 40 mg daily  -A1c 5.4 at goal <7, continue lifestyle modification -counciled on smoking cessation.

## 2015-01-29 NOTE — Assessment & Plan Note (Signed)
[name] went for sleep study and has been fitted with CPAP. He reports compliance with his CPAP and improvement in his sleep quality.  Plan CPAP qhs

## 2015-01-29 NOTE — Assessment & Plan Note (Signed)
Mr. [name] reports continued symptoms of depression and anxiety. He denies any SI/HI. Does report episodes of uncontrolled anxiety with difficulty breathing and feeling of hopelessness that last 10-20 minutes 1-2 times per week. His wife reports she has given him Valium a few times that have helped his symptoms and he has been on Xanax for anxiety in the past.   Plan -On max dose of Celexa, continue Celexa 40 mg daily -Likely having panic attacks, will attempt to avoid benzos. Start Buspar 7.5 mg bid.  -RTC in 1 month

## 2015-01-29 NOTE — Assessment & Plan Note (Signed)
-  Colonoscopy done with 2 polyps noted. Tubular adenoma with no high grade dysplasia. Repeat colonoscopy in 5 years. -DM eye exam with no retinopathy

## 2015-01-30 ENCOUNTER — Encounter: Payer: Self-pay | Admitting: Neurology

## 2015-01-30 ENCOUNTER — Ambulatory Visit (INDEPENDENT_AMBULATORY_CARE_PROVIDER_SITE_OTHER): Payer: No Typology Code available for payment source | Admitting: Neurology

## 2015-01-30 VITALS — BP 116/70 | HR 73 | Ht 72.0 in | Wt 238.2 lb

## 2015-01-30 DIAGNOSIS — G4733 Obstructive sleep apnea (adult) (pediatric): Secondary | ICD-10-CM

## 2015-01-30 DIAGNOSIS — E785 Hyperlipidemia, unspecified: Secondary | ICD-10-CM

## 2015-01-30 DIAGNOSIS — I63312 Cerebral infarction due to thrombosis of left middle cerebral artery: Secondary | ICD-10-CM | POA: Diagnosis not present

## 2015-01-30 DIAGNOSIS — I10 Essential (primary) hypertension: Secondary | ICD-10-CM

## 2015-01-30 DIAGNOSIS — F17200 Nicotine dependence, unspecified, uncomplicated: Secondary | ICD-10-CM | POA: Diagnosis not present

## 2015-01-30 DIAGNOSIS — M542 Cervicalgia: Secondary | ICD-10-CM | POA: Diagnosis not present

## 2015-01-30 NOTE — Patient Instructions (Signed)
-   continue ASA and lipitor for stroke prevention - quit smoking completely - check BP and glucose at home - Follow up with your primary care physician for stroke risk factor modification. Recommend maintain blood pressure goal <130/80, diabetes with hemoglobin A1c goal below 6.5% and lipids with LDL cholesterol goal below 70 mg/dL.  - continue celexa for depression and mood - use CPAP at night. - recommend PT for neck pain if desire.  - follow up in 6 months and repeat carotid doppler study.

## 2015-01-30 NOTE — Progress Notes (Addendum)
STROKE NEUROLOGY FOLLOW UP NOTE  NAME: [name] DOB: [birth year]  REASON FOR VISIT: stroke follow up HISTORY FROM: pt and wife and chart  Today we had the pleasure of seeing [name] in follow-up at our Neurology Clinic. Pt was accompanied by wife.   History Summary [name] is a 59 y.o. male with history of CVA in 2009, HTN, HLD, DM, current smoker, previous carotid artery stenting, and OSA, who has not had regular medical follow-up for several years and apparently stopped taking all of his medications several months prior was admitted on 09/03/14 for left corona radiata/external capsule lacunar infarct secondary to likely small vessel disease source. MRA showed left PCA occlusion. 2D echo and CUS unremarkable. LDL 151 and A1C 5.9. Dysarthria much better and he was discharged with ASA and lipitor.  11/07/14 follow up -  the patient has been doing well. Slurry speech resolved. However, he started to have neck pain on the left and left shoulder pain intermittently. His left arm seems weaker than before. He continued to smoke. Compliant with ASA and lipitor now. BP 125/70 in clinic. He is on celexa for mood and depression, much improved as per wife. He has OSA but his CPAP mask does not fit, so he is not on CPAP for a long time.    Interval History During the interval time, pt has been doing well without stroke like symptoms. He had sleep study done and was put on CPAP for OSA. Also had PT for two sessions for neck and shoulder pain, his shoulder pain much resolved but still has neck pain. Had EMG/NCS in 11/2014 showed mild b/l CTS but no cervical radiculopathy. His glucose at home 80s and A1C normal without medications. He has not quit smoking yet, but has nicotine gum and patch at home to start soon. BP today 116/70. Still on ASA and lipitor without problems.  REVIEW OF SYSTEMS: Full 14 system review of systems performed and notable only for those listed below  and in HPI above, all others are negative:  Constitutional:  fatigue Cardiovascular:  Ear/Nose/Throat:  Skin: moles Eyes:  Eye discharge, eye itching Respiratory:   Gastroitestinal:   Genitourinary:  Hematology/Lymphatic:   Endocrine:  Musculoskeletal:  Back pain Allergy/Immunology:   Neurological:   Psychiatric: agitation Sleep: apnea  The following represents the patient's updated allergies and side effects list: Allergies  Allergen Reactions  . Wellbutrin [Bupropion] Other (See Comments)    Makes people more nervous and anger    The neurologically relevant items on the patient's problem list were reviewed on today's visit.  Neurologic Examination  A problem focused neurological exam (12 or more points of the single system neurologic examination, vital signs counts as 1 point, cranial nerves count for 8 points) was performed.  Blood pressure 116/70, pulse 73, height 6' (1.829 m), weight 238 lb 3.2 oz (108.047 kg).  General - Well nourished, well developed, in no apparent distress.  Ophthalmologic - Sharp disc margins OU.   Cardiovascular - Regular rate and rhythm with no murmur.  Mental Status -  Level of arousal and orientation to time, place, and person were intact. Language including expression, naming, repetition, comprehension was assessed and found intact. Fund of Knowledge was assessed and was intact.  Cranial Nerves II - XII - II - Visual field intact OU. III, IV, VI - Extraocular movements intact. V - Facial sensation intact bilaterally. VII - Facial movement intact bilaterally. VIII - Hearing & vestibular intact  bilaterally. X - Palate elevates symmetrically. XI - Chin turning & shoulder shrug intact bilaterally. XII - Tongue protrusion intact.  Motor Strength - The patient's strength was normal in all extremities and pronator drift was absent.  Bulk was normal and fasciculations were absent.   Motor Tone - Muscle tone was assessed at the neck and  appendages and was normal.  Reflexes - The patient's reflexes were 1+ in all extremities and he had no pathological reflexes.  Sensory - Light touch, temperature/pinprick, vibration and proprioception, and Romberg testing were assessed and were normal    Coordination - The patient had normal movements in the hands and feet with no ataxia or dysmetria.  Tremor was absent.  Gait and Station - The patient's transfers, posture, gait, station, and turns were observed as normal except mild broad based gait.  Data reviewed: I personally reviewed the images and agree with the radiology interpretations.  Ct Head Wo Contrast 09/03/2014 1. Progression of left frontal and parietal lobe deep white matter infarcts. 2. No acute intracranial hemorrhage, stroke or mass noted.   Mr Brain Wo Contrast 09/03/2014 1. Acute on chronic small vessel ischemia. Acute lacunar type infarct extending from the left corona radiata through the left external capsule. No hemorrhage or mass effect. 2. Small chronic white matter and cortically based infarcts throughout the left MCA territory. Small chronic right thalamic lacunar infarct.   MRA brain Fetal origin left posterior cerebral artery which is occluded in the midportion. Anterior circulation shows no significant stenosis.  Carotid Doppler There is 1-39% bilateral ICA stenosis. Vertebral artery flow is antegrade.   2D Echocardiogram  - Left ventricle: The cavity size was normal. Wall thickness wasincreased in a pattern of mild LVH. Systolic function was normal.The estimated ejection fraction was in the range of 60% to 65%.Wall motion was normal; there were no regional wall motionabnormalities. Doppler parameters are consistent with abnormalleft ventricular relaxation (grade 1 diastolic dysfunction). TheE/e&' ratio is >15, suggesting elevated LV filling pressure. - Aortic valve: Trileaflet. The left coronary cusp appears to beimmobile. There was no  stenosis. There was no regurgitation. - Mitral valve: Calcified annulus. Calcified chordae. Trivial MR. - Left atrium: Moderately dilated at 42 ml/m2. - Right atrium: Mildly dilated at 22 cm2. - Inferior vena cava: The vessel was normal in size. Therespirophasic diameter changes were in the normal range (>= 50%),consistent with normal central venous pressure. Impressions: LVEF 60-65%, mild LVH, diastolic dysfunction, elevated LV fillingpressure, immobile left coronary cusp of the aortic valve(without significant stenosis), MAC with trivial MR, moderateLAE, mild RAE, normal IVC.  EMG/NCS 11/24/14 - Abnormal study demonstrate: 1. Mild bilateral median neuropathies at the wrist consistent with mild carpal tunnel syndrome. 2. No evidence of left cervical radiculopathy.  Component     Latest Ref Rng 09/03/2014 09/04/2014  Cholesterol     0 - 200 mg/dL  203 (H)  Triglycerides     <150 mg/dL  106  HDL Cholesterol     >40 mg/dL  31 (L)  Total CHOL/HDL Ratio       6.5  VLDL     0 - 40 mg/dL  21  LDL (calc)     0 - 99 mg/dL  151 (H)  Hemoglobin A1C     4.8 - 5.6 % 6.0 (H) 5.9 (H)  Mean Plasma Glucose      126 123    Assessment: As you may recall, he is a 59 y.o. African American male with PMH of CVA in 2009, HTN,  HLD, DM, current smoker, previous carotid artery stenting, and OSA was admitted on 09/03/14 for left corona radiata/external capsule lacunar infarct secondary to likely small vessel disease source. He was not on medication due to noncompliance prior to admission. MRA showed left PCA occlusion. 2D echo and CUS unremarkable. LDL 151 he was discharged with ASA and lipitor. He has been doing well but developed left neck pain and shoulder pain causing left UE proximal weakness. EMG showed b/l mild CTS but no cervical radiculopathy. After PT x 2, shoulder pain resolved, still has neck pain but better. Had sleep study and adjusted CPAP device. BP and glucose in good control at home. Not quit  smoking yet. Will repeat CUS in 6 months.  Plan:  - continue ASA and lipitor for stroke prevention - quit smoking completely - check BP and glucose at home - Follow up with your primary care physician for stroke risk factor modification. Recommend maintain blood pressure goal <130/80, diabetes with hemoglobin A1c goal below 6.5% and lipids with LDL cholesterol goal below 70 mg/dL.  - continue celexa for depression and mood - use CPAP at night. - follow up in 6 months and repeat CUS.  I spent more than 25 minutes of face to face time with the patient. Greater than 50% of time was spent in counseling and coordination of care. We have discussed about smoking cessation, follow up CUS and compliance with CPAP.   No orders of the defined types were placed in this encounter.    No orders of the defined types were placed in this encounter.    Patient Instructions  - continue ASA and lipitor for stroke prevention - quit smoking completely - check BP and glucose at home - Follow up with your primary care physician for stroke risk factor modification. Recommend maintain blood pressure goal <130/80, diabetes with hemoglobin A1c goal below 6.5% and lipids with LDL cholesterol goal below 70 mg/dL.  - continue celexa for depression and mood - use CPAP at night. - recommend PT for neck pain if desire.  - follow up in 6 months and repeat carotid doppler study.    Rosalin Hawking, MD PhD [facility] [address], [place], [place] [address] [phone]

## 2015-01-30 NOTE — Progress Notes (Signed)
Internal Medicine Clinic Attending  I saw and evaluated the patient.  I personally confirmed the key portions of the history and exam documented by Dr. Boswell and I reviewed pertinent patient test results.  The assessment, diagnosis, and plan were formulated together and I agree with the documentation in the resident's note. 

## 2015-02-05 MED ORDER — CEFAZOLIN SODIUM-DEXTROSE 2-3 GM-% IV SOLR
2.0000 g | INTRAVENOUS | Status: AC
  Administered 2015-02-06: 2 g via INTRAVENOUS
  Filled 2015-02-05 (×2): qty 50

## 2015-02-05 MED ORDER — CHLORHEXIDINE GLUCONATE 4 % EX LIQD: 1.0000 "application " | Freq: Once | CUTANEOUS | Status: DC

## 2015-02-06 ENCOUNTER — Encounter (HOSPITAL_COMMUNITY)
Admission: AD | Disposition: A | Discharge status: Home / Self Care | Payer: Self-pay | Source: Ambulatory Visit | Attending: Surgery

## 2015-02-06 ENCOUNTER — Inpatient Hospital Stay (HOSPITAL_COMMUNITY)
Admission: AD | Admit: 2015-02-06 | Discharge: 2015-02-09 | DRG: 827 | Disposition: A | Discharge status: Home / Self Care | Payer: No Typology Code available for payment source | Source: Ambulatory Visit | Attending: General Surgery | Admitting: General Surgery

## 2015-02-06 ENCOUNTER — Ambulatory Visit (HOSPITAL_COMMUNITY): Payer: No Typology Code available for payment source | Admitting: Anesthesiology

## 2015-02-06 ENCOUNTER — Encounter (HOSPITAL_COMMUNITY): Payer: Self-pay | Admitting: *Deleted

## 2015-02-06 ENCOUNTER — Ambulatory Visit (HOSPITAL_COMMUNITY): Payer: No Typology Code available for payment source | Admitting: Emergency Medicine

## 2015-02-06 DIAGNOSIS — G5600 Carpal tunnel syndrome, unspecified upper limb: Secondary | ICD-10-CM | POA: Diagnosis present

## 2015-02-06 DIAGNOSIS — Z79899 Other long term (current) drug therapy: Secondary | ICD-10-CM

## 2015-02-06 DIAGNOSIS — E1122 Type 2 diabetes mellitus with diabetic chronic kidney disease: Secondary | ICD-10-CM | POA: Diagnosis present

## 2015-02-06 DIAGNOSIS — I129 Hypertensive chronic kidney disease with stage 1 through stage 4 chronic kidney disease, or unspecified chronic kidney disease: Secondary | ICD-10-CM | POA: Diagnosis present

## 2015-02-06 DIAGNOSIS — R7303 Prediabetes: Secondary | ICD-10-CM | POA: Diagnosis present

## 2015-02-06 DIAGNOSIS — R1031 Right lower quadrant pain: Secondary | ICD-10-CM | POA: Diagnosis present

## 2015-02-06 DIAGNOSIS — N183 Chronic kidney disease, stage 3 (moderate): Secondary | ICD-10-CM | POA: Diagnosis present

## 2015-02-06 DIAGNOSIS — C7A8 Other malignant neuroendocrine tumors: Secondary | ICD-10-CM | POA: Diagnosis present

## 2015-02-06 DIAGNOSIS — G4733 Obstructive sleep apnea (adult) (pediatric): Secondary | ICD-10-CM | POA: Diagnosis present

## 2015-02-06 DIAGNOSIS — E785 Hyperlipidemia, unspecified: Secondary | ICD-10-CM | POA: Diagnosis present

## 2015-02-06 DIAGNOSIS — K439 Ventral hernia without obstruction or gangrene: Secondary | ICD-10-CM | POA: Diagnosis present

## 2015-02-06 DIAGNOSIS — G4730 Sleep apnea, unspecified: Secondary | ICD-10-CM | POA: Diagnosis present

## 2015-02-06 DIAGNOSIS — K5660 Unspecified intestinal obstruction: Secondary | ICD-10-CM | POA: Diagnosis present

## 2015-02-06 DIAGNOSIS — R591 Generalized enlarged lymph nodes: Secondary | ICD-10-CM | POA: Diagnosis present

## 2015-02-06 DIAGNOSIS — E669 Obesity, unspecified: Secondary | ICD-10-CM | POA: Diagnosis present

## 2015-02-06 DIAGNOSIS — F17200 Nicotine dependence, unspecified, uncomplicated: Secondary | ICD-10-CM | POA: Diagnosis present

## 2015-02-06 DIAGNOSIS — I5189 Other ill-defined heart diseases: Secondary | ICD-10-CM | POA: Diagnosis present

## 2015-02-06 DIAGNOSIS — Z6832 Body mass index (BMI) 32.0-32.9, adult: Secondary | ICD-10-CM

## 2015-02-06 DIAGNOSIS — K6389 Other specified diseases of intestine: Secondary | ICD-10-CM | POA: Diagnosis present

## 2015-02-06 DIAGNOSIS — I10 Essential (primary) hypertension: Secondary | ICD-10-CM | POA: Diagnosis present

## 2015-02-06 HISTORY — DX: Pneumonia, unspecified organism: J18.9

## 2015-02-06 HISTORY — DX: Cerebral infarction, unspecified: I63.9

## 2015-02-06 HISTORY — PX: LAPAROSCOPIC ASSISTED VENTRAL HERNIA REPAIR: SHX6312

## 2015-02-06 HISTORY — PX: LAPAROSCOPY: SHX197

## 2015-02-06 HISTORY — DX: Obstructive sleep apnea (adult) (pediatric): G47.33

## 2015-02-06 HISTORY — DX: Type 2 diabetes mellitus with diabetic polyneuropathy: E11.42

## 2015-02-06 HISTORY — PX: LAPAROSCOPIC SMALL BOWEL RESECTION: SUR793

## 2015-02-06 HISTORY — DX: Dependence on other enabling machines and devices: Z99.89

## 2015-02-06 HISTORY — DX: Personal history of other diseases of the musculoskeletal system and connective tissue: Z87.39

## 2015-02-06 HISTORY — DX: Other cerebral infarction due to occlusion or stenosis of small artery: I63.81

## 2015-02-06 HISTORY — PX: BOWEL RESECTION: SHX1257

## 2015-02-06 LAB — POCT I-STAT 4, (NA,K, GLUC, HGB,HCT)
Glucose, Bld: 94 mg/dL (ref 65–99)
HEMATOCRIT: 48 % (ref 39.0–52.0)
HEMOGLOBIN: 16.3 g/dL (ref 13.0–17.0)
POTASSIUM: 4.4 mmol/L (ref 3.5–5.1)
Sodium: 139 mmol/L (ref 135–145)

## 2015-02-06 LAB — GLUCOSE, CAPILLARY: Glucose-Capillary: 122 mg/dL — ABNORMAL HIGH (ref 65–99)

## 2015-02-06 SURGERY — LAPAROSCOPY, DIAGNOSTIC
Anesthesia: General | Site: Abdomen

## 2015-02-06 MED ORDER — PHENYLEPHRINE 40 MCG/ML (10ML) SYRINGE FOR IV PUSH (FOR BLOOD PRESSURE SUPPORT)
PREFILLED_SYRINGE | INTRAVENOUS | Status: AC
  Filled 2015-02-06: qty 10

## 2015-02-06 MED ORDER — MAGIC MOUTHWASH: 15.0000 mL | Freq: Four times a day (QID) | ORAL | Status: DC | PRN

## 2015-02-06 MED ORDER — BUPIVACAINE-EPINEPHRINE (PF) 0.25% -1:200000 IJ SOLN
INTRAMUSCULAR | Status: AC
  Filled 2015-02-06: qty 90

## 2015-02-06 MED ORDER — LISINOPRIL 10 MG PO TABS
10.0000 mg | ORAL_TABLET | Freq: Every day | ORAL | Status: DC
  Administered 2015-02-07 – 2015-02-09 (×3): 10 mg via ORAL
  Filled 2015-02-06 (×3): qty 1

## 2015-02-06 MED ORDER — MENTHOL 3 MG MT LOZG
1.0000 | LOZENGE | OROMUCOSAL | Status: DC | PRN
Start: 2015-02-06 — End: 2015-02-09

## 2015-02-06 MED ORDER — ONDANSETRON HCL 4 MG PO TABS
4.0000 mg | ORAL_TABLET | Freq: Four times a day (QID) | ORAL | Status: DC | PRN
  Administered 2015-02-08: 4 mg via ORAL
  Filled 2015-02-06: qty 1

## 2015-02-06 MED ORDER — NICOTINE 14 MG/24HR TD PT24
14.0000 mg | MEDICATED_PATCH | TRANSDERMAL | Status: DC
  Filled 2015-02-06 (×3): qty 1

## 2015-02-06 MED ORDER — MIDAZOLAM HCL 2 MG/2ML IJ SOLN
INTRAMUSCULAR | Status: AC
  Filled 2015-02-06: qty 2

## 2015-02-06 MED ORDER — PREGABALIN 75 MG PO CAPS
75.0000 mg | ORAL_CAPSULE | Freq: Every evening | ORAL | Status: DC | PRN
  Administered 2015-02-08: 75 mg via ORAL
  Filled 2015-02-06: qty 1

## 2015-02-06 MED ORDER — BISACODYL 10 MG RE SUPP: 10.0000 mg | Freq: Two times a day (BID) | RECTAL | Status: DC | PRN

## 2015-02-06 MED ORDER — SODIUM CHLORIDE 0.9 % IR SOLN
Status: DC | PRN
  Administered 2015-02-06: 1000 mL

## 2015-02-06 MED ORDER — ROCURONIUM BROMIDE 50 MG/5ML IV SOLN
INTRAVENOUS | Status: AC
  Filled 2015-02-06: qty 2

## 2015-02-06 MED ORDER — DIPHENHYDRAMINE HCL 12.5 MG/5ML PO ELIX
12.5000 mg | ORAL_SOLUTION | Freq: Four times a day (QID) | ORAL | Status: DC | PRN
  Administered 2015-02-07 (×2): 12.5 mg via ORAL
  Filled 2015-02-06 (×2): qty 10

## 2015-02-06 MED ORDER — LACTATED RINGERS IV SOLN
INTRAVENOUS | Status: DC | PRN
  Administered 2015-02-06 (×2): via INTRAVENOUS

## 2015-02-06 MED ORDER — FENTANYL CITRATE (PF) 100 MCG/2ML IJ SOLN
INTRAMUSCULAR | Status: DC | PRN
  Administered 2015-02-06: 50 ug via INTRAVENOUS
  Administered 2015-02-06: 150 ug via INTRAVENOUS
  Administered 2015-02-06: 100 ug via INTRAVENOUS

## 2015-02-06 MED ORDER — 0.9 % SODIUM CHLORIDE (POUR BTL) OPTIME
TOPICAL | Status: DC | PRN
  Administered 2015-02-06: 1000 mL

## 2015-02-06 MED ORDER — 0.9 % SODIUM CHLORIDE (POUR BTL) OPTIME
TOPICAL | Status: DC | PRN
  Administered 2015-02-06 (×2): 1000 mL

## 2015-02-06 MED ORDER — HYDROMORPHONE HCL 1 MG/ML IJ SOLN
0.5000 mg | INTRAMUSCULAR | Status: DC | PRN
  Administered 2015-02-06 (×3): 1 mg via INTRAVENOUS
  Administered 2015-02-07 (×3): 2 mg via INTRAVENOUS
  Administered 2015-02-07: 1 mg via INTRAVENOUS
  Administered 2015-02-07: 2 mg via INTRAVENOUS
  Administered 2015-02-07 (×2): 1 mg via INTRAVENOUS
  Administered 2015-02-08 (×2): 2 mg via INTRAVENOUS
  Filled 2015-02-06 (×2): qty 1
  Filled 2015-02-06: qty 2
  Filled 2015-02-06: qty 1
  Filled 2015-02-06 (×5): qty 2
  Filled 2015-02-06 (×2): qty 1
  Filled 2015-02-06: qty 2
  Filled 2015-02-06: qty 1

## 2015-02-06 MED ORDER — LACTATED RINGERS IV BOLUS (SEPSIS): 1000.0000 mL | Freq: Three times a day (TID) | INTRAVENOUS | Status: AC | PRN

## 2015-02-06 MED ORDER — GLYCOPYRROLATE 0.2 MG/ML IJ SOLN
INTRAMUSCULAR | Status: AC
  Filled 2015-02-06: qty 1

## 2015-02-06 MED ORDER — NICOTINE POLACRILEX 2 MG MT GUM
4.0000 mg | CHEWING_GUM | OROMUCOSAL | Status: DC | PRN
  Filled 2015-02-06: qty 2

## 2015-02-06 MED ORDER — BUPIVACAINE-EPINEPHRINE 0.25% -1:200000 IJ SOLN
INTRAMUSCULAR | Status: DC | PRN
  Administered 2015-02-06: 60 mL

## 2015-02-06 MED ORDER — LIDOCAINE HCL 1 % IJ SOLN
INTRAMUSCULAR | Status: DC | PRN
  Administered 2015-02-06: 30 mL

## 2015-02-06 MED ORDER — LIDOCAINE HCL (CARDIAC) 20 MG/ML IV SOLN
INTRAVENOUS | Status: DC | PRN
  Administered 2015-02-06: 50 mg via INTRAVENOUS

## 2015-02-06 MED ORDER — ROCURONIUM BROMIDE 50 MG/5ML IV SOLN
INTRAVENOUS | Status: AC
  Filled 2015-02-06: qty 1

## 2015-02-06 MED ORDER — EPHEDRINE SULFATE 50 MG/ML IJ SOLN
INTRAMUSCULAR | Status: DC | PRN
  Administered 2015-02-06 (×2): 10 mg via INTRAVENOUS

## 2015-02-06 MED ORDER — LIP MEDEX EX OINT
1.0000 "application " | TOPICAL_OINTMENT | Freq: Two times a day (BID) | CUTANEOUS | Status: DC
  Administered 2015-02-06 – 2015-02-09 (×5): 1 via TOPICAL
  Filled 2015-02-06: qty 7

## 2015-02-06 MED ORDER — PROPOFOL 10 MG/ML IV BOLUS
INTRAVENOUS | Status: DC | PRN
  Administered 2015-02-06: 180 mg via INTRAVENOUS

## 2015-02-06 MED ORDER — EPHEDRINE SULFATE 50 MG/ML IJ SOLN
INTRAMUSCULAR | Status: AC
  Filled 2015-02-06: qty 1

## 2015-02-06 MED ORDER — ROCURONIUM BROMIDE 100 MG/10ML IV SOLN
INTRAVENOUS | Status: DC | PRN
  Administered 2015-02-06: 30 mg via INTRAVENOUS
  Administered 2015-02-06 (×2): 10 mg via INTRAVENOUS

## 2015-02-06 MED ORDER — SUGAMMADEX SODIUM 200 MG/2ML IV SOLN
INTRAVENOUS | Status: DC | PRN
  Administered 2015-02-06: 216 mg via INTRAVENOUS

## 2015-02-06 MED ORDER — PHENYLEPHRINE HCL 10 MG/ML IJ SOLN
INTRAMUSCULAR | Status: DC | PRN
  Administered 2015-02-06: 80 ug via INTRAVENOUS

## 2015-02-06 MED ORDER — OXYCODONE HCL 5 MG PO TABS: 5.0000 mg | ORAL_TABLET | ORAL | Status: DC | PRN

## 2015-02-06 MED ORDER — ONDANSETRON HCL 4 MG/2ML IJ SOLN: 4.0000 mg | Freq: Four times a day (QID) | INTRAMUSCULAR | Status: DC | PRN

## 2015-02-06 MED ORDER — DEXTROSE 5 % IV SOLN
2.0000 g | Freq: Two times a day (BID) | INTRAVENOUS | Status: AC
  Administered 2015-02-06: 2 g via INTRAVENOUS
  Filled 2015-02-06: qty 2

## 2015-02-06 MED ORDER — SUCCINYLCHOLINE CHLORIDE 20 MG/ML IJ SOLN
INTRAMUSCULAR | Status: AC
  Filled 2015-02-06: qty 1

## 2015-02-06 MED ORDER — ATORVASTATIN CALCIUM 40 MG PO TABS
40.0000 mg | ORAL_TABLET | Freq: Every day | ORAL | Status: DC
  Administered 2015-02-06 – 2015-02-08 (×3): 40 mg via ORAL
  Filled 2015-02-06 (×3): qty 1

## 2015-02-06 MED ORDER — SUCCINYLCHOLINE CHLORIDE 20 MG/ML IJ SOLN
INTRAMUSCULAR | Status: DC | PRN
  Administered 2015-02-06: 100 mg via INTRAVENOUS

## 2015-02-06 MED ORDER — PROPOFOL 10 MG/ML IV BOLUS
INTRAVENOUS | Status: AC
  Filled 2015-02-06: qty 20

## 2015-02-06 MED ORDER — DIPHENHYDRAMINE HCL 50 MG/ML IJ SOLN: 12.5000 mg | Freq: Four times a day (QID) | INTRAMUSCULAR | Status: DC | PRN

## 2015-02-06 MED ORDER — FENTANYL CITRATE (PF) 250 MCG/5ML IJ SOLN
INTRAMUSCULAR | Status: AC
  Filled 2015-02-06: qty 5

## 2015-02-06 MED ORDER — ASPIRIN EC 81 MG PO TBEC
81.0000 mg | DELAYED_RELEASE_TABLET | Freq: Every day | ORAL | Status: DC
  Administered 2015-02-06 – 2015-02-09 (×4): 81 mg via ORAL
  Filled 2015-02-06 (×4): qty 1

## 2015-02-06 MED ORDER — ONDANSETRON HCL 4 MG/2ML IJ SOLN
INTRAMUSCULAR | Status: AC
  Filled 2015-02-06: qty 2

## 2015-02-06 MED ORDER — CITALOPRAM HYDROBROMIDE 20 MG PO TABS
40.0000 mg | ORAL_TABLET | Freq: Every day | ORAL | Status: DC
  Administered 2015-02-06 – 2015-02-09 (×4): 40 mg via ORAL
  Filled 2015-02-06 (×4): qty 2

## 2015-02-06 MED ORDER — PHENOL 1.4 % MT LIQD: 2.0000 | OROMUCOSAL | Status: DC | PRN

## 2015-02-06 MED ORDER — ALBUMIN HUMAN 5 % IV SOLN
INTRAVENOUS | Status: DC | PRN
  Administered 2015-02-06: 14:00:00 via INTRAVENOUS

## 2015-02-06 MED ORDER — SODIUM CHLORIDE 0.9 % IV SOLN
INTRAVENOUS | Status: DC
  Administered 2015-02-06: 10 mL/h via INTRAVENOUS

## 2015-02-06 MED ORDER — ACETAMINOPHEN 500 MG PO TABS
1000.0000 mg | ORAL_TABLET | Freq: Three times a day (TID) | ORAL | Status: DC
  Administered 2015-02-06 – 2015-02-09 (×9): 1000 mg via ORAL
  Filled 2015-02-06 (×9): qty 2

## 2015-02-06 MED ORDER — LIDOCAINE HCL (CARDIAC) 20 MG/ML IV SOLN
INTRAVENOUS | Status: AC
  Filled 2015-02-06: qty 5

## 2015-02-06 MED ORDER — ENOXAPARIN SODIUM 40 MG/0.4ML ~~LOC~~ SOLN
40.0000 mg | SUBCUTANEOUS | Status: DC
Start: 2015-02-07 — End: 2015-02-09
  Administered 2015-02-07 – 2015-02-09 (×3): 40 mg via SUBCUTANEOUS
  Filled 2015-02-06 (×3): qty 0.4

## 2015-02-06 MED ORDER — SACCHAROMYCES BOULARDII 250 MG PO CAPS
250.0000 mg | ORAL_CAPSULE | Freq: Two times a day (BID) | ORAL | Status: DC
  Administered 2015-02-06 – 2015-02-09 (×6): 250 mg via ORAL
  Filled 2015-02-06 (×6): qty 1

## 2015-02-06 MED ORDER — METOPROLOL TARTRATE 1 MG/ML IV SOLN
5.0000 mg | Freq: Four times a day (QID) | INTRAVENOUS | Status: DC | PRN
Start: 2015-02-06 — End: 2015-02-09

## 2015-02-06 MED ORDER — PROMETHAZINE HCL 25 MG/ML IJ SOLN: 6.2500 mg | INTRAMUSCULAR | Status: DC | PRN

## 2015-02-06 MED ORDER — SUGAMMADEX SODIUM 200 MG/2ML IV SOLN
INTRAVENOUS | Status: AC
  Filled 2015-02-06: qty 2

## 2015-02-06 MED ORDER — ONDANSETRON HCL 4 MG/2ML IJ SOLN
INTRAMUSCULAR | Status: DC | PRN
  Administered 2015-02-06: 4 mg via INTRAVENOUS

## 2015-02-06 MED ORDER — BUSPIRONE HCL 15 MG PO TABS
7.5000 mg | ORAL_TABLET | Freq: Two times a day (BID) | ORAL | Status: DC
  Administered 2015-02-06 – 2015-02-09 (×6): 7.5 mg via ORAL
  Filled 2015-02-06 (×6): qty 1

## 2015-02-06 MED ORDER — ALUM & MAG HYDROXIDE-SIMETH 200-200-20 MG/5ML PO SUSP
30.0000 mL | Freq: Four times a day (QID) | ORAL | Status: DC | PRN
Start: 2015-02-06 — End: 2015-02-09

## 2015-02-06 MED ORDER — SODIUM CHLORIDE 0.9 % IV SOLN
INTRAVENOUS | Status: DC
  Administered 2015-02-06 – 2015-02-08 (×3): via INTRAVENOUS

## 2015-02-06 MED ORDER — METRONIDAZOLE IVPB CUSTOM
500.0000 mg | Freq: Three times a day (TID) | INTRAVENOUS | Status: DC
  Administered 2015-02-06: 500 mg via INTRAVENOUS
  Filled 2015-02-06: qty 100

## 2015-02-06 SURGICAL SUPPLY — 77 items
APPLICATOR COTTON TIP 6IN STRL (MISCELLANEOUS) IMPLANT
APPLIER CLIP LOGIC TI 5 (MISCELLANEOUS) IMPLANT
APR CLP MED LRG 33X5 (MISCELLANEOUS)
BINDER ABD UNIV 12 45-62 (WOUND CARE) IMPLANT
BINDER ABDOMINAL 46IN 62IN (WOUND CARE)
BLADE SURG ROTATE 9660 (MISCELLANEOUS) IMPLANT
CANISTER SUCTION 2500CC (MISCELLANEOUS) ×3 IMPLANT
CELLS DAT CNTRL 66122 CELL SVR (MISCELLANEOUS) ×2 IMPLANT
CHLORAPREP W/TINT 26ML (MISCELLANEOUS) ×3 IMPLANT
COVER SURGICAL LIGHT HANDLE (MISCELLANEOUS) ×3 IMPLANT
DEVICE SECURE STRAP 25 ABSORB (INSTRUMENTS) IMPLANT
DEVICE TROCAR PUNCTURE CLOSURE (ENDOMECHANICALS) ×3 IMPLANT
DRAPE WARM FLUID 44X44 (DRAPE) ×3 IMPLANT
DRSG OPSITE POSTOP 4X10 (GAUZE/BANDAGES/DRESSINGS) ×2 IMPLANT
DRSG OPSITE POSTOP 4X6 (GAUZE/BANDAGES/DRESSINGS) ×2 IMPLANT
DRSG TEGADERM 2-3/8X2-3/4 SM (GAUZE/BANDAGES/DRESSINGS) ×8 IMPLANT
ELECT CAUTERY BLADE 6.4 (BLADE) ×2 IMPLANT
ELECT REM PT RETURN 9FT ADLT (ELECTROSURGICAL) ×3
ELECTRODE REM PT RTRN 9FT ADLT (ELECTROSURGICAL) ×2 IMPLANT
GAUZE SPONGE 2X2 8PLY STRL LF (GAUZE/BANDAGES/DRESSINGS) ×3 IMPLANT
GLOVE BIO SURGEON STRL SZ7 (GLOVE) ×4 IMPLANT
GLOVE BIOGEL M 7.0 STRL (GLOVE) ×2 IMPLANT
GLOVE BIOGEL PI IND STRL 7.0 (GLOVE) ×4 IMPLANT
GLOVE BIOGEL PI IND STRL 8 (GLOVE) ×2 IMPLANT
GLOVE BIOGEL PI INDICATOR 7.0 (GLOVE) ×4
GLOVE BIOGEL PI INDICATOR 8 (GLOVE) ×1
GLOVE ECLIPSE 8.0 STRL XLNG CF (GLOVE) ×3 IMPLANT
GLOVE SURG SS PI 6.5 STRL IVOR (GLOVE) ×4 IMPLANT
GOWN STRL REUS W/ TWL LRG LVL3 (GOWN DISPOSABLE) ×4 IMPLANT
GOWN STRL REUS W/ TWL XL LVL3 (GOWN DISPOSABLE) ×2 IMPLANT
GOWN STRL REUS W/TWL LRG LVL3 (GOWN DISPOSABLE) ×6
GOWN STRL REUS W/TWL XL LVL3 (GOWN DISPOSABLE) ×3
KIT BASIN OR (CUSTOM PROCEDURE TRAY) ×3 IMPLANT
KIT ROOM TURNOVER OR (KITS) ×3 IMPLANT
LIGASURE IMPACT 36 18CM CVD LR (INSTRUMENTS) ×2 IMPLANT
MARKER SKIN DUAL TIP RULER LAB (MISCELLANEOUS) ×3 IMPLANT
NDL SPNL 22GX3.5 QUINCKE BK (NEEDLE) ×1 IMPLANT
NEEDLE 22X1 1/2 (OR ONLY) (NEEDLE) ×3 IMPLANT
NEEDLE SPNL 22GX3.5 QUINCKE BK (NEEDLE) ×3 IMPLANT
NS IRRIG 1000ML POUR BTL (IV SOLUTION) ×6 IMPLANT
PAD ARMBOARD 7.5X6 YLW CONV (MISCELLANEOUS) ×6 IMPLANT
PENCIL BUTTON HOLSTER BLD 10FT (ELECTRODE) ×2 IMPLANT
RELOAD PROXIMATE 75MM BLUE (ENDOMECHANICALS) ×6 IMPLANT
RELOAD STAPLE 75 3.8 BLU REG (ENDOMECHANICALS) IMPLANT
RETRACTOR WND ALEXIS 18 MED (MISCELLANEOUS) IMPLANT
RTRCTR WOUND ALEXIS 18CM MED (MISCELLANEOUS) ×3
SCALPEL HARMONIC ACE (MISCELLANEOUS) IMPLANT
SCISSORS LAP 5X35 DISP (ENDOMECHANICALS) ×3 IMPLANT
SET IRRIG TUBING LAPAROSCOPIC (IRRIGATION / IRRIGATOR) ×3 IMPLANT
SLEEVE ENDOPATH XCEL 5M (ENDOMECHANICALS) ×6 IMPLANT
SPONGE GAUZE 2X2 STER 10/PKG (GAUZE/BANDAGES/DRESSINGS) ×2
SPONGE LAP 18X18 X RAY DECT (DISPOSABLE) ×2 IMPLANT
STAPLER 90 3.5 STAND SLIM (STAPLE) ×3
STAPLER 90 3.5 STD SLIM (STAPLE) ×1 IMPLANT
STAPLER PROXIMATE 75MM BLUE (STAPLE) ×2 IMPLANT
STAPLER VISISTAT 35W (STAPLE) ×2 IMPLANT
STRIP CLOSURE SKIN 1/2X4 (GAUZE/BANDAGES/DRESSINGS) ×3 IMPLANT
SUT MNCRL AB 4-0 PS2 18 (SUTURE) ×3 IMPLANT
SUT PDS AB 1 CT  36 (SUTURE)
SUT PDS AB 1 CT 36 (SUTURE) IMPLANT
SUT PDS AB 1 TP1 96 (SUTURE) ×4 IMPLANT
SUT PROLENE 1 CT (SUTURE) ×6 IMPLANT
SUT SILK 0 TIES 10X30 (SUTURE) ×2 IMPLANT
SUT SILK 2 0 SH CR/8 (SUTURE) ×4 IMPLANT
SUT SILK 3 0 SH CR/8 (SUTURE) ×2 IMPLANT
SUT VICRYL 0 UR6 27IN ABS (SUTURE) IMPLANT
TACKER 5MM HERNIA 3.5CML NAB (ENDOMECHANICALS) IMPLANT
TAPE UMBILICAL 1/8 X36 TWILL (MISCELLANEOUS) ×2 IMPLANT
TOWEL OR 17X24 6PK STRL BLUE (TOWEL DISPOSABLE) ×3 IMPLANT
TOWEL OR 17X26 10 PK STRL BLUE (TOWEL DISPOSABLE) ×3 IMPLANT
TRAY FOLEY CATH 16FR SILVER (SET/KITS/TRAYS/PACK) IMPLANT
TRAY LAPAROSCOPIC MC (CUSTOM PROCEDURE TRAY) ×3 IMPLANT
TROCAR XCEL NON-BLD 11X100MML (ENDOMECHANICALS) ×3 IMPLANT
TROCAR XCEL NON-BLD 5MMX100MML (ENDOMECHANICALS) ×3 IMPLANT
TUBE CONNECTING 12X1/4 (SUCTIONS) ×2 IMPLANT
TUBING INSUFFLATION (TUBING) ×3 IMPLANT
YANKAUER SUCT BULB TIP NO VENT (SUCTIONS) ×2 IMPLANT

## 2015-02-06 NOTE — H&P (Deleted)
Mariah Milling 12/07/2014 10:44 AM Location: [facility] Patient #: [id number] DOB: 12/15/1964 Married / Language: English / Race: White Male   History of Present Illness  Patient words: LIH.  The patient is a 59 year old male who presents with an inguinal hernia. Patient sent by her gynecologist Dr. Ronita Hipps for concern of LEFT inguinal hernia.  Pleasant active male. Has had some issues with urinary continence and difficulty with defecation. Was evaluated by numerous gynecologists. Thinks that she was diagnosed with cystocele and uterine prolapse and rectocele. Dr Quincy Simmonds discussed about doing pelvic floor surgery. It was recommended to do open. She got a second opinion with Dr. Ronita Hipps who sounds like he is going to offer her some type of laparoscopic repair. Maybe a hysterectomy as well as true uterine prolapse.  She has had intermittent LEFT groin pains and there is been concern for possible hernia. Had less adhesions to remove some adhesions from her LEFT ovary to her sigmoid colon over a decade ago but no history of obstructions. Given the concerns of possible inguinal or femoral hernia, surgical consultation requested. Patient notes she's had some mild groin discomfort and swelling intermittently for the past few years. She thinks another Psychologist, sport and exercise had discussed repair if there are in the past but held off. No history of skin infection. She did have a cholecystectomy by her group, Dr. Georgette Dover in 2014. Tolerated that well. No skin or urinary tract infections. She does not smoke. Rather physically active. Recalls getting colonoscopy about 10 years ago by [facility] gastroenterology that was underwhelming. I cannot find those records. No incontinence to flatus or stool. Sometimes she feels she has inadequate emptying with defecation. No history of episiotomy or tears. No history of hemorrhoids or anorectal surgery. No prolapsing of the rectum. No Prolixin  hemorrhoids. She thinks she has a few small anal tags but nothing horrifically bothersome  No new events.  I have re-reviewed the the patient's records, history, medications, and allergies.  I have re-examined the patient.  I again discussed intraoperative plans and goals of post-operative recovery.  The patient agrees to proceed.   Other Problems Marjean Donna, CMA; 12/07/2014 10:44 AM) Back Pain Bladder Problems Cholelithiasis Gastroesophageal Reflux Disease General anesthesia - complications Heart murmur High blood pressure Inguinal Hernia Migraine Headache Other disease, cancer, significant illness  Past Surgical History Marjean Donna, CMA; 12/07/2014 10:44 AM) Breast Biopsy Left. Gallbladder Surgery - Laparoscopic Spinal Surgery Midback Tonsillectomy  Diagnostic Studies History Marjean Donna, CMA; 12/07/2014 10:44 AM) Colonoscopy >10 years ago Mammogram 1-3 years ago Pap Smear 1-5 years ago  Allergies Marjean Donna, CMA; 12/07/2014 10:46 AM) PenicillAMINE *Assorted Classes** Latex Exam Gloves *MEDICAL DEVICES AND SUPPLIES* Norvasc *CALCIUM CHANNEL BLOCKERS*  Medication History (Sonya Bynum, CMA; 12/07/2014 10:47 AM) TraMADol HCl (50MG  Tablet, Oral as needed) Active. Meloxicam (7.5MG  Tablet, Oral) Active. Dilt-XR (120MG  Capsule ER 24HR, Oral) Active. Fluconazole (150MG  Tablet, Oral) Active. Clobetasol Propionate (0.05% Cream, External) Active. Cyclobenzaprine HCl (10MG  Tablet, Oral) Active. Medications Reconciled  Social History Marjean Donna, CMA; 12/07/2014 10:44 AM) Alcohol use Occasional alcohol use. Caffeine use Tea. No drug use Tobacco use Never smoker.  Family History Marjean Donna, [place]; 12/07/2014 10:44 AM) Arthritis Mother. Diabetes Mellitus Family Members In General. Hypertension Family Members In General, Father, Mother, Sister. Ischemic Bowel Disease Mother. Migraine Headache Mother.  Pregnancy / Birth History  Marjean Donna, [place]; 12/07/2014 10:44 AM) Age at menarche 59 years. Gravida 2 Irregular periods Maternal age 47-25 Para 2    Review of Systems (  Sonya Bynum CMA; 12/07/2014 10:44 AM) General Not Present- Appetite Loss, Chills, Fatigue, Fever, Night Sweats, Weight Gain and Weight Loss. Skin Not Present- Change in Wart/Mole, Dryness, Hives, Jaundice, New Lesions, Non-Healing Wounds, Rash and Ulcer. HEENT Not Present- Earache, Hearing Loss, Hoarseness, Nose Bleed, Oral Ulcers, Ringing in the Ears, Seasonal Allergies, Sinus Pain, Sore Throat, Visual Disturbances, Wears glasses/contact lenses and Yellow Eyes. Respiratory Not Present- Bloody sputum, Chronic Cough, Difficulty Breathing, Snoring and Wheezing. Breast Not Present- Breast Mass, Breast Pain, Nipple Discharge and Skin Changes. Cardiovascular Not Present- Chest Pain, Difficulty Breathing Lying Down, Leg Cramps, Palpitations, Rapid Heart Rate, Shortness of Breath and Swelling of Extremities. Gastrointestinal Present- Abdominal Pain, Excessive gas, Indigestion, Nausea and Rectal Pain. Not Present- Bloating, Bloody Stool, Change in Bowel Habits, Chronic diarrhea, Constipation, Difficulty Swallowing, Gets full quickly at meals, Hemorrhoids and Vomiting. Musculoskeletal Present- Back Pain, Joint Pain and Muscle Pain. Not Present- Joint Stiffness, Muscle Weakness and Swelling of Extremities. Neurological Not Present- Decreased Memory, Fainting, Headaches, Numbness, Seizures, Tingling, Tremor, Trouble walking and Weakness. Psychiatric Not Present- Anxiety, Bipolar, Change in Sleep Pattern, Depression, Fearful and Frequent crying. Endocrine Present- Cold Intolerance. Not Present- Excessive Hunger, Hair Changes, Heat Intolerance, Hot flashes and New Diabetes. Hematology Not Present- Easy Bruising, Excessive bleeding, Gland problems, HIV and Persistent Infections.  Vitals (Sonya Bynum CMA; 12/07/2014 10:45 AM) 12/07/2014 10:44 AM Weight: 154 lb  Height: 68in Body Surface Area: 1.83 m Body Mass Index: 23.42 kg/m  Temp.: 97.42F(Temporal)  Pulse: 83 (Regular)  BP: 128/78 (Sitting, Left Arm, Standard)       Physical Exam Adin Hector MD; 12/07/2014 11:30 AM) General Mental Status-Alert. General Appearance-Not in acute distress, Not Sickly. Orientation-Oriented X3. Hydration-Well hydrated. Voice-Normal.  Integumentary Global Assessment Upon inspection and palpation of skin surfaces of the - Axillae: non-tender, no inflammation or ulceration, no drainage. and Distribution of scalp and body hair is normal. General Characteristics Temperature - normal warmth is noted.  Head and Neck Head-normocephalic, atraumatic with no lesions or palpable masses. Face Global Assessment - atraumatic, no absence of expression. Neck Global Assessment - no abnormal movements, no bruit auscultated on the right, no bruit auscultated on the left, no decreased range of motion, non-tender. Trachea-midline. Thyroid Gland Characteristics - non-tender.  Eye Eyeball - Left-Extraocular movements intact, No Nystagmus. Eyeball - Right-Extraocular movements intact, No Nystagmus. Cornea - Left-No Hazy. Cornea - Right-No Hazy. Sclera/Conjunctiva - Left-No scleral icterus, No Discharge. Sclera/Conjunctiva - Right-No scleral icterus, No Discharge. Pupil - Left-Direct reaction to light normal. Pupil - Right-Direct reaction to light normal.  ENMT Ears Pinna - Left - no drainage observed, no generalized tenderness observed. Right - no drainage observed, no generalized tenderness observed. Nose and Sinuses External Inspection of the Nose - no destructive lesion observed. Inspection of the nares - Left - quiet respiration. Right - quiet respiration. Mouth and Throat Lips - Upper Lip - no fissures observed, no pallor noted. Lower Lip - no fissures observed, no pallor noted. Nasopharynx - no discharge present.  Oral Cavity/Oropharynx - Tongue - no dryness observed. Oral Mucosa - no cyanosis observed. Hypopharynx - no evidence of airway distress observed.  Chest and Lung Exam Inspection Movements - Normal and Symmetrical. Accessory muscles - No use of accessory muscles in breathing. Palpation Palpation of the chest reveals - Non-tender. Auscultation Breath sounds - Normal and Clear.  Cardiovascular Auscultation Rhythm - Regular. Murmurs & Other Heart Sounds - Auscultation of the heart reveals - No Murmurs and No Systolic Clicks.  Abdomen Inspection  Inspection of the abdomen reveals - No Visible peristalsis and No Abnormal pulsations. Umbilicus - No Bleeding, No Urine drainage. Palpation/Percussion Palpation and Percussion of the abdomen reveal - Soft, Non Tender, No Rebound tenderness, No Rigidity (guarding) and No Cutaneous hyperesthesia.  Male Genitourinary Sexual Maturity Tanner 5 - Adult hair pattern. Note: No vaginal bleeding nor discharge. Held off on pelvic examination given recent numerous gynecological evaluations and ultrasound.   Rectal Note: Deferred this visit.   Peripheral Vascular Upper Extremity Inspection - Left - No Cyanotic nailbeds, Not Ischemic. Right - No Cyanotic nailbeds, Not Ischemic.  Neurologic Neurologic evaluation reveals -normal attention span and ability to concentrate, able to name objects and repeat phrases. Appropriate fund of knowledge , normal sensation and normal coordination. Mental Status Affect - not angry, not paranoid. Cranial Nerves-Normal Bilaterally. Gait-Normal.  Neuropsychiatric Mental status exam performed with findings of-able to articulate well with normal speech/language, rate, volume and coherence, thought content normal with ability to perform basic computations and apply abstract reasoning and no evidence of hallucinations, delusions, obsessions or homicidal/suicidal ideation.  Musculoskeletal Global  Assessment Spine, Ribs and Pelvis - no instability, subluxation or laxity. Right Upper Extremity - no instability, subluxation or laxity.  Lymphatic Head & Neck  General Head & Neck Lymphatics: Bilateral - Description - No Localized lymphadenopathy. Axillary  General Axillary Region: Bilateral - Description - No Localized lymphadenopathy. Femoral & Inguinal  Generalized Femoral & Inguinal Lymphatics: Left - Description - No Localized lymphadenopathy. Right - Description - No Localized lymphadenopathy.    Assessment & Plan UNILATERAL FEMORAL HERNIA WITHOUT OBSTRUCTION OR GANGRENE, RECURRENCE NOT SPECIFIED (K41.90) Impression: Mild groin bulging low concern for possible LEFT femoral hernia. Inguinal hernia is less likely.  I think it is reasonable to laparoscopically inspect the region while she is undergoing her other pelvic floor surgery. Sounds like would be a laparoscopic repair of cystocele. Possible hysterectomy versus uterine pexy. Possible transperineal repair of rectocele. If a hernia is seen, I would have a low threshold to repair that side. Preperitoneal mesh. Seems to be LEFT. Would provide an opportunity to make sure there is no hernia on the RIGHT side as well and fixed have a low threshold to fix the contralateral side of seen since it will be a challenge to try and repair it later.  We are trying to reach his office to see with Dr. Ronita Hipps on his planning.  I also given a handout about pelvic floor rehabilitation. Should her issues not be resolved with the surgeries, she could benefit from rehabilitation or workup to help with issues of defecation and urination. Current Plans You are being scheduled for surgery - Our schedulers will call you. We will need time to help coordinate the case with your gynecologist, Dr. Ronita Hipps.  You should hear from our office's scheduling department within 5 working days about the location, date, and time of surgery. We try to make accommodations  for patient's preferences in scheduling surgery, but sometimes the OR schedule or the surgeon's schedule prevents [place] from making those accommodations.  If you have not heard from our office [phone]) in 5 working days, call the office and ask for your surgeon's nurse.  If you have other questions about your diagnosis, plan, or surgery, call the office and ask for your surgeon's nurse.  Written instructions provided The anatomy & physiology of the abdominal wall and pelvic floor was discussed. The pathophysiology of hernias in the inguinal and pelvic region was discussed. Natural history risks such as progressive  enlargement, pain, incarceration, and strangulation was discussed. Contributors to complications such as smoking, obesity, diabetes, prior surgery, etc were discussed.  I feel the risks of no intervention will lead to serious problems that outweigh the operative risks; therefore, I recommended surgery to reduce and repair the hernia. I explained laparoscopic techniques with possible need for an open approach. I noted usual use of mesh to patch and/or buttress hernia repair  Risks such as bleeding, infection, abscess, need for further treatment, heart attack, death, and other risks were discussed. I noted a good likelihood this will help address the problem. Goals of post-operative recovery were discussed as well. Possibility that this will not correct all symptoms was explained. I stressed the importance of low-impact activity, aggressive pain control, avoiding constipation, & not pushing through pain to minimize risk of post-operative chronic pain or injury. Possibility of reherniation was discussed. We will work to minimize complications.  An educational handout further explaining the pathology & treatment options was given as well. Questions were answered. The patient expresses understanding & wishes to proceed with surgery.  I have re-reviewed the the patient's records, history,  medications, and allergies.  I have re-examined the patient.  I again discussed intraoperative plans and goals of post-operative recovery.  The patient agrees to proceed.   Pt Education - Pamphlet Given - Laparoscopic Hernia Repair: discussed with patient and provided information. Pt Education - CCS Hernia Post-Op HCI (Delynn Pursley): discussed with patient and provided information. Pt Education - CCS Pain Control (Emmalynne Courtney)  Adin Hector, M.D., F.A.C.S. Gastrointestinal and Minimally Invasive Surgery [facility] [address] [place], [place] [address] [phone] Main / Paging

## 2015-02-06 NOTE — Interval H&P Note (Signed)
History and Physical Interval Note:  02/06/2015 1:13 PM  [name]  has presented today for surgery, with the diagnosis of VENTRAL HERNIA  The various methods of treatment have been discussed with the patient and family. After consideration of risks, benefits and other options for treatment, the patient has consented to  Procedure(s): [place] (N/A) as a surgical intervention .  The patient's history has been reviewed, patient examined, no change in status, stable for surgery.  I have reviewed the patient's chart and labs.  Questions were answered to the patient's satisfaction.     Chad Florentino C.

## 2015-02-06 NOTE — H&P (Signed)
[name] 11/09/2014 8:27 AM Location: [facility] Patient #: [id number] DOB: [birth year] Married / Language: English / Race: White Male   History of Present Illness The patient is a 59 year old male who presents with an incisional hernia. Patient sent for surgical consultation by Dr. Charlynn Grimes for concern of ventral hernia. Diabetic hypertensive male with history of strokes in 2009 a 2016. Smoker. Complaining of hernia and abdomen for the past 2 years. Increasing and becoming symptomatic. Discuss with his primary care physician. Surgical consultation recommended. Patient comes today with his wife. Strokes felt to be due to uncontrolled blood pressure. That is much better controlled. He is intentionally lost over 100 pounds. He is cutback of smoking and half. His colonoscopy. [place] getting his life tuned up. An complaint is pain in his RIGHT lower abdomen. It is been intermittent and now constant for the past 2 years. He is felt popping when he coughs and does heavy lifting. Occasionally is felt a lump as well. Not always there. He was concerned. Primary care physician concerned for possible hernia. Patient had an appendectomy but the pain and lump seems to be a few inches above all that. Surgeries. He can walk about 15 minutes before stopping. No heart attacks. He is not on oxygen.  Seen by neurology & medicine this month.  Not on blood thinners  Other Problems Davy Pique Bynum, CMA; 11/09/2014 8:27 AM) Anxiety Disorder Arthritis Back Pain Cerebrovascular Accident Diabetes Mellitus Emphysema Of Lung High blood pressure Hypercholesterolemia Sleep Apnea Umbilical Hernia Repair  Past Surgical History Marjean Donna, CMA; 11/09/2014 8:27 AM) Appendectomy Carotid Artery Surgery Left. Knee Surgery Left.  Diagnostic Studies History Marjean Donna, CMA; 11/09/2014 8:27 AM) Colonoscopy never  Allergies (Sonya Bynum, CMA; 11/09/2014 8:28 AM) No Known  Drug Allergies09/21/2016  Medication History (Sonya Bynum, CMA; 11/09/2014 8:28 AM) Lyrica (75MG  Capsule, Oral) Active. Atorvastatin Calcium (40MG  Tablet, Oral) Active. Citalopram Hydrobromide (40MG  Tablet, Oral) Active. Lisinopril (10MG  Tablet, Oral) Active. Medications Reconciled  Social History Marjean Donna, CMA; 11/09/2014 8:27 AM) Alcohol use Remotely quit alcohol use. Caffeine use Carbonated beverages, Tea. Illicit drug use Prefer to discuss with provider. Tobacco use Current every day smoker.  Family History Marjean Donna, [place]; 11/09/2014 8:27 AM) Alcohol Abuse Sister. Arthritis Father, Mother, Sister. Breast Cancer Mother. Cerebrovascular Accident Father. Colon Polyps Father, Mother. Depression Sister. Diabetes Mellitus Brother, Father, Mother, Sister. Heart Disease Father, Mother. Heart disease in male family member before age 38 Heart disease in male family member before age 70 Hypertension Brother, Father, Mother, Sister. Kidney Disease Father. Respiratory Condition Father.    Review of Systems ([place]; 11/09/2014 8:27 AM) General Present- Fatigue. Not Present- Appetite Loss, Chills, Fever, Night Sweats, Weight Gain and Weight Loss. Skin Not Present- Change in Wart/Mole, Dryness, Hives, Jaundice, New Lesions, Non-Healing Wounds, Rash and Ulcer. HEENT Present- Seasonal Allergies. Not Present- Earache, Hearing Loss, Hoarseness, Nose Bleed, Oral Ulcers, Ringing in the Ears, Sinus Pain, Sore Throat, Visual Disturbances, Wears glasses/contact lenses and Yellow Eyes. Respiratory Present- Chronic Cough and Snoring. Not Present- Bloody sputum, Difficulty Breathing and Wheezing. Cardiovascular Not Present- Chest Pain, Difficulty Breathing Lying Down, Leg Cramps, Palpitations, Rapid Heart Rate, Shortness of Breath and Swelling of Extremities. Gastrointestinal Present- Change in Bowel Habits. Not Present- Abdominal Pain, Bloating, Bloody Stool,  Chronic diarrhea, Constipation, Difficulty Swallowing, Excessive gas, Gets full quickly at meals, Hemorrhoids, Indigestion, Nausea, Rectal Pain and Vomiting. Male Genitourinary Present- Impotence. Not Present- Blood in Urine, Change in Urinary Stream, Frequency, Nocturia, Painful Urination,  Urgency and Urine Leakage. Musculoskeletal Present- Joint Pain, Muscle Pain and Muscle Weakness. Not Present- Back Pain, Joint Stiffness and Swelling of Extremities. Neurological Not Present- Decreased Memory, Fainting, Headaches, Numbness, Seizures, Tingling, Tremor, Trouble walking and Weakness. Psychiatric Present- Anxiety. Not Present- Bipolar, Change in Sleep Pattern, Depression, Fearful and Frequent crying. Endocrine Not Present- Cold Intolerance, Excessive Hunger, Hair Changes, Heat Intolerance, Hot flashes and New Diabetes. Hematology Not Present- Easy Bruising, Excessive bleeding, Gland problems, HIV and Persistent Infections.  Vitals (Sonya Bynum CMA; 11/09/2014 8:28 AM) 11/09/2014 8:28 AM Weight: 238 lb Height: 72in Body Surface Area: 2.34 m Body Mass Index: 32.28 kg/m  Temp.: 43F(Temporal)  Pulse: 76 (Regular)  BP: 118/76 (Sitting, Left Arm, Standard)     Physical Exam Adin Hector MD; 11/09/2014 9:22 AM) General Mental Status-Alert. General Appearance-Not in acute distress, Not Sickly. Orientation-Oriented X3. Hydration-Well hydrated. Voice-Normal.  Integumentary Global Assessment Upon inspection and palpation of skin surfaces of the - Axillae: non-tender, no inflammation or ulceration, no drainage. and Distribution of scalp and body hair is normal. General Characteristics Temperature - normal warmth is noted.  Head and Neck Head-normocephalic, atraumatic with no lesions or palpable masses. Face Global Assessment - atraumatic, no absence of expression. Neck Global Assessment - no abnormal movements, no bruit auscultated on the right, no bruit  auscultated on the left, no decreased range of motion, non-tender. Trachea-midline. Thyroid Gland Characteristics - non-tender.  Eye Eyeball - Left-Extraocular movements intact, No Nystagmus. Eyeball - Right-Extraocular movements intact, No Nystagmus. Cornea - Left-No Hazy. Cornea - Right-No Hazy. Sclera/Conjunctiva - Left-No scleral icterus, No Discharge. Sclera/Conjunctiva - Right-No scleral icterus, No Discharge. Pupil - Left-Direct reaction to light normal. Pupil - Right-Direct reaction to light normal.  ENMT Ears Pinna - Left - no drainage observed, no generalized tenderness observed. Right - no drainage observed, no generalized tenderness observed. Nose and Sinuses External Inspection of the Nose - no destructive lesion observed. Inspection of the nares - Left - quiet respiration. Right - quiet respiration. Mouth and Throat Lips - Upper Lip - no fissures observed, no pallor noted. Lower Lip - no fissures observed, no pallor noted. Nasopharynx - no discharge present. Oral Cavity/Oropharynx - Tongue - no dryness observed. Oral Mucosa - no cyanosis observed. Hypopharynx - no evidence of airway distress observed.  Chest and Lung Exam Inspection Movements - Normal and Symmetrical. Accessory muscles - No use of accessory muscles in breathing. Palpation Palpation of the chest reveals - Non-tender. Auscultation Breath sounds - Normal and Clear.  Cardiovascular Auscultation Rhythm - Regular. Murmurs & Other Heart Sounds - Auscultation of the heart reveals - No Murmurs and No Systolic Clicks.  Abdomen Inspection Inspection of the abdomen reveals - No Visible peristalsis and No Abnormal pulsations. Umbilicus - No Bleeding, No Urine drainage. Palpation/Percussion Palpation and Percussion of the abdomen reveal - Soft, Non Tender, No Rebound tenderness, No Rigidity (guarding) and No Cutaneous hyperesthesia. Note: Obese but very soft. Possible mild suprapubic  umbilical diastases recti. Subtle atypical swelling and RIGHT infraumbilical paramedian region. Location for classic spigelian hernia. RIGHT groin suprapubic incision consistent with prior appendectomy. No definite hernia there.   Male Genitourinary Sexual Maturity Tanner 5 - Adult hair pattern and Adult penile size and shape. Note: Circumcised male. Testes and epididymides and cords normal. No inguinal hernias. Moderate sized mons pubis. Hygiene good.   Peripheral Vascular Upper Extremity Inspection - Left - No Cyanotic nailbeds, Not Ischemic. Right - No Cyanotic nailbeds, Not Ischemic.  Neurologic Neurologic evaluation reveals -normal attention  span and ability to concentrate, able to name objects and repeat phrases. Appropriate fund of knowledge , normal sensation and normal coordination. Mental Status Affect - not angry, not paranoid. Cranial Nerves-Normal Bilaterally. Gait-Normal.  Neuropsychiatric Mental status exam performed with findings of-able to articulate well with normal speech/language, rate, volume and coherence, thought content normal with ability to perform basic computations and apply abstract reasoning and no evidence of hallucinations, delusions, obsessions or homicidal/suicidal ideation. Note: Contents to mumble. However no difficulty with gait. No significant dysarthria   Musculoskeletal Global Assessment Spine, Ribs and Pelvis - no instability, subluxation or laxity. Right Upper Extremity - no instability, subluxation or laxity.  Lymphatic Head & Neck  General Head & Neck Lymphatics: Bilateral - Description - No Localized lymphadenopathy. Axillary  General Axillary Region: Bilateral - Description - No Localized lymphadenopathy. Femoral & Inguinal  Generalized Femoral & Inguinal Lymphatics: Left - Description - No Localized lymphadenopathy. Right - Description - No Localized lymphadenopathy.  Ct Head Wo Contrast 09/03/2014 1. Progression of  left frontal and parietal lobe deep white matter infarcts. 2. No acute intracranial hemorrhage, stroke or mass noted.   Mr Brain Wo Contrast 09/03/2014 1. Acute on chronic small vessel ischemia. Acute lacunar type infarct extending from the left corona radiata through the left external capsule. No hemorrhage or mass effect. 2. Small chronic white matter and cortically based infarcts throughout the left MCA territory. Small chronic right thalamic lacunar infarct.   MRA brain Fetal origin left posterior cerebral artery which is occluded in the midportion. Anterior circulation shows no significant stenosis.  Carotid Doppler There is 1-39% bilateral ICA stenosis. Vertebral artery flow is antegrade.   2D Echocardiogram  - Left ventricle: The cavity size was normal. Wall thickness wasincreased in a pattern of mild LVH. Systolic function was normal.The estimated ejection fraction was in the range of 60% to 65%.Wall motion was normal; there were no regional wall motionabnormalities. Doppler parameters are consistent with abnormalleft ventricular relaxation (grade 1 diastolic dysfunction). TheE/e&' ratio is >15, suggesting elevated LV filling pressure. - Aortic valve: Trileaflet. The left coronary cusp appears to beimmobile. There was no stenosis. There was no regurgitation. - Mitral valve: Calcified annulus. Calcified chordae. Trivial MR. - Left atrium: Moderately dilated at 42 ml/m2. - Right atrium: Mildly dilated at 22 cm2. - Inferior vena cava: The vessel was normal in size. Therespirophasic diameter changes were in the normal range (>= 50%),consistent with normal central venous pressure. Impressions: LVEF 60-65%, mild LVH, diastolic dysfunction, elevated LV fillingpressure, immobile left coronary cusp of the aortic valve(without significant stenosis), MAC with trivial MR, moderateLAE, mild RAE, normal IVC.  EMG/NCS 11/24/14 - Abnormal study demonstrate: 1. Mild bilateral median  neuropathies at the wrist consistent with mild carpal tunnel syndrome. 2. No evidence of left cervical radiculopathy.  Component  Latest Ref Rng 09/03/2014 09/04/2014  Cholesterol  0 - 200 mg/dL  203 (H)  Triglycerides  <150 mg/dL  106  HDL Cholesterol  >40 mg/dL  31 (L)  Total CHOL/HDL Ratio    6.5  VLDL  0 - 40 mg/dL  21  LDL (calc)  0 - 99 mg/dL  151 (H)  Hemoglobin A1C  4.8 - 5.6 % 6.0 (H) 5.9 (H)  Mean Plasma Glucose   126 123          Assessment & Plan  SPIGELIAN HERNIA (K43.9) Impression: Bulging discomfort suspicious first began hernia. Other possibility is an incisional hernia at appendectomy although his lump seems more cephalad.  Another possibility is  muscle strain. I think he would benefit from a trial of anti-inflammatories (tylenol) and heat for the next 2 weeks to see if soreness will call down.  However this is been going on for 2 years.  I offered diagnostic laparoscopy with repair of hernias found. He is leaning more towards surgery since this is affecting his quality of life.  Again strongly recommend he completely quit smoking. I'm glad his lost weight at smoking makes his risk, patient's of surgery higher. He will consider it. Current Plans You are being scheduled for surgery - Our schedulers will call you.  You should hear from our office's scheduling department within 5 working days about the location, date, and time of surgery. We try to make accommodations for patient's preferences in scheduling surgery, but sometimes the OR schedule or the surgeon's schedule prevents [place] from making those accommodations.  If you have not heard from our office [phone]) in 5 working days, call the office and ask for your surgeon's nurse.  If you have other questions about your diagnosis, plan, or surgery, call the office and ask for your surgeon's nurse.  Pt Education - Pamphlet Given - Laparoscopic  Hernia Repair: discussed with patient and provided information.   The anatomy & physiology of the abdominal wall was discussed. The pathophysiology of hernias was discussed. Natural history risks without surgery including progeressive enlargement, pain, incarceration, & strangulation was discussed. Contributors to complications such as smoking, obesity, diabetes, prior surgery, etc were discussed.  I feel the risks of no intervention will lead to serious problems that outweigh the operative risks; therefore, I recommended surgery to reduce and repair the hernia. I explained laparoscopic techniques with possible need for an open approach. I noted the probable use of mesh to patch and/or buttress the hernia repair  Risks such as bleeding, infection, abscess, need for further treatment, heart attack, death, and other risks were discussed. I noted a good likelihood this will help address the problem. Goals of post-operative recovery were discussed as well. Possibility that this will not correct all symptoms was explained. I stressed the importance of low-impact activity, aggressive pain control, avoiding constipation, & not pushing through pain to minimize risk of post-operative chronic pain or injury. Possibility of reherniation especially with smoking, obesity, diabetes, immunosuppression, and other health conditions was discussed. We will work to minimize complications.  An educational handout further explaining the pathology & treatment options was given as well. Questions were answered. The patient expresses understanding & wishes to proceed with surgery.  Cleared by medicine & seen by neurology.  Pt Education - [facility] Hernia Post-Op HCI (Trinh Sanjose): discussed with patient and provided information. Pt Education - [facility] Pain control - tylenol only: discussed with patient and provided information. Pt Education - [facility] STOP SMOKING! Pt Education - [facility] - General recommendations  Adin Hector, M.D.,  F.A.C.S. Gastrointestinal and Minimally Invasive Surgery [facility] [address], [place], [place] [address] [phone] Main / Paging

## 2015-02-06 NOTE — Anesthesia Postprocedure Evaluation (Signed)
Anesthesia Post Note  Patient: [name]  Procedure(s) Performed: Procedure(s) (LRB): LAPAROSCOPY DIAGNOSTIC (N/A) SMALL BOWEL RESECTION (N/A)  Patient location during evaluation: PACU Anesthesia Type: General Level of consciousness: awake Vital Signs Assessment: post-procedure vital signs reviewed and stable Respiratory status: spontaneous breathing Cardiovascular status: stable Anesthetic complications: no    Last Vitals:  Filed Vitals:   02/06/15 1615 02/06/15 1649  BP: 111/65 124/51  Pulse: 89 84  Temp: 36.7 C 36.6 C  Resp: 18 20    Last Pain:  Filed Vitals:   02/06/15 1650  PainSc: 0-No pain                 EDWARDS,Gurtaj Ruz

## 2015-02-06 NOTE — Transfer of Care (Signed)
Immediate Anesthesia Transfer of Care Note  Patient: [name]  Procedure(s) Performed: Procedure(s): LAPAROSCOPY DIAGNOSTIC (N/A) SMALL BOWEL RESECTION (N/A)  Patient Location: PACU  Anesthesia Type:General  Level of Consciousness: awake, alert  and oriented  Airway & Oxygen Therapy: Patient Spontanous Breathing and Patient connected to nasal cannula oxygen  Post-op Assessment: Report given to RN and Post -op Vital signs reviewed and stable  Post vital signs: Reviewed and stable  Last Vitals:  Filed Vitals:   02/06/15 1540 02/06/15 1545  BP:  126/52  Pulse: 96 90  Temp: 36.6 C   Resp: 18 20    Complications: No apparent anesthesia complications

## 2015-02-06 NOTE — Discharge Instructions (Signed)
HERNIA REPAIR: POST OP INSTRUCTIONS ° °1. DIET: Follow a light bland diet the first 24 hours after arrival home, such as soup, liquids, crackers, etc.  Be sure to include lots of fluids daily.  Avoid fast food or heavy meals as your are more likely to get nauseated.  Eat a low fat the next few days after surgery. °2. Take your usually prescribed home medications unless otherwise directed. °3. PAIN CONTROL: °a. Pain is best controlled by a usual combination of three different methods TOGETHER: °i. Ice/Heat °ii. Over the counter pain medication °iii. Prescription pain medication °b. Most patients will experience some swelling and bruising around the hernia(s) such as the bellybutton, groins, or old incisions.  Ice packs or heating pads (30-60 minutes up to 6 times a day) will help. Use ice for the first few days to help decrease swelling and bruising, then switch to heat to help relax tight/sore spots and speed recovery.  Some people prefer to use ice alone, heat alone, alternating between ice & heat.  Experiment to what works for you.  Swelling and bruising can take several weeks to resolve.   °c. It is helpful to take an over-the-counter pain medication regularly for the first few weeks.  Choose one of the following that works best for you: °i. Naproxen (Aleve, etc)  Two 220mg tabs twice a day °ii. Ibuprofen (Advil, etc) Three 200mg tabs four times a day (every meal & bedtime) °iii. Acetaminophen (Tylenol, etc) 325-650mg four times a day (every meal & bedtime) °d. A  prescription for pain medication should be given to you upon discharge.  Take your pain medication as prescribed.  °i. If you are having problems/concerns with the prescription medicine (does not control pain, nausea, vomiting, rash, itching, etc), please call us [phone] to see if we need to switch you to a different pain medicine that will work better for you and/or control your side effect better. °ii. If you need a refill on your pain  medication, please contact your pharmacy.  They will contact our office to request authorization. Prescriptions will not be filled after 5 pm or on week-ends. °4. Avoid getting constipated.  Between the surgery and the pain medications, it is common to experience some constipation.  Increasing fluid intake and taking a fiber supplement (such as Metamucil, Citrucel, FiberCon, MiraLax, etc) 1-2 times a day regularly will usually help prevent this problem from occurring.  A mild laxative (prune juice, Milk of Magnesia, MiraLax, etc) should be taken according to package directions if there are no bowel movements after 48 hours.   °5. Wash / shower every day.  You may shower over the dressings as they are waterproof.   °6. Remove your waterproof bandages 5 days after surgery.  You may leave the incision open to air.  You may replace a dressing/Band-Aid to cover the incision for comfort if you wish.  Continue to shower over incision(s) after the dressing is off. ° ° ° °7. ACTIVITIES as tolerated:   °a. You may resume regular (light) daily activities beginning the next day--such as daily self-care, walking, climbing stairs--gradually increasing activities as tolerated.  If you can walk 30 minutes without difficulty, it is safe to try more intense activity such as jogging, treadmill, bicycling, low-impact aerobics, swimming, etc. °b. Save the most intensive and strenuous activity for last such as sit-ups, heavy lifting, contact sports, etc  Refrain from any heavy lifting or straining until you are off narcotics for pain control.   °  c. DO NOT PUSH THROUGH PAIN.  Let pain be your guide: If it hurts to do something, don't do it.  Pain is your body warning you to avoid that activity for another week until the pain goes down. d. You may drive when you are no longer taking prescription pain medication, you can comfortably wear a seatbelt, and you can safely maneuver your car and apply brakes. e. Dennis Bast may have sexual intercourse  when it is comfortable.  8. FOLLOW UP in our office a. Please call [facility] at [phone] to set up an appointment to see your surgeon in the office for a follow-up appointment approximately 2-3 weeks after your surgery. b. Make sure that you call for this appointment the day you arrive home to insure a convenient appointment time. 9.  IF YOU HAVE DISABILITY OR FAMILY LEAVE FORMS, BRING THEM TO THE OFFICE FOR PROCESSING.  DO NOT GIVE THEM TO YOUR DOCTOR.  WHEN TO CALL us [phone]: 1. Poor pain control 2. Reactions / problems with new medications (rash/itching, nausea, etc)  3. Fever over 101.5 F (38.5 C) 4. Inability to urinate 5. Nausea and/or vomiting 6. Worsening swelling or bruising 7. Continued bleeding from incision. 8. Increased pain, redness, or drainage from the incision   The clinic staff is available to answer your questions during regular business hours (8:30am-5pm).  Please dont hesitate to call and ask to speak to one of our nurses for clinical concerns.   If you have a medical emergency, go to the nearest emergency room or call 911.  A surgeon from [facility] is always on call at the hospitals in [facility], [place], [place], [place], [place]  [address] ?  [address] [address], [place], [place]   [address] MAIN: [phone] ? TOLL FREE: [phone] ? FAX: [phone] [email or web address]  Managing Pain  Pain after surgery or related to activity is often due to strain/injury to muscle, tendon, nerves and/or incisions.  This pain is usually short-term and will improve in a few months.   Many people find it helpful to do the following things TOGETHER to help speed the process of healing and to get back to regular activity more quickly:  1. Avoid heavy physical activity at first a. No lifting greater than 20 pounds at first, then increase to lifting as tolerated over the next few weeks b. Do not push  through the pain.  Listen to your body and avoid positions and maneuvers than reproduce the pain.  Wait a few days before trying something more intense c. Walking is okay as tolerated, but go slowly and stop when getting sore.  If you can walk 30 minutes without stopping or pain, you can try more intense activity (running, jogging, aerobics, cycling, swimming, treadmill, sex, sports, weightlifting, etc ) d. Remember: If it hurts to do it, then dont do it!  2. Take Acetaminophen Anti-inflammatory medication i. Acetaminophen 500mg  tabs (Tylenol) 1-2 pills with every meal and just before bedtime (avoid if you have liver problems) ii. Take with food/snack around the clock for 1-2 weeks iii. This helps the muscle and nerve tissues become less irritable and calm down faster  3. Use a Heating pad or Ice/Cold Pack a. 4-6 times a day b. May use warm bath/hottub  or showers  4. Try Gentle Massage and/or Stretching  a. at the area of pain many times a day b. stop if you feel pain - do not overdo  it  Try these steps together to help you body heal faster and avoid making things get worse.  Doing just one of these things may not be enough.    If you are not getting better after two weeks or are noticing you are getting worse, contact our office for further advice; we may need to re-evaluate you & see what other things we can do to help.  Hernia, Adult A hernia is the bulging of an organ or tissue through a weak spot in the muscles of the abdomen (abdominal wall). Hernias develop most often near the navel or groin. There are many kinds of hernias. Common kinds include:  Femoral hernia. This kind of hernia develops under the groin in the upper thigh area.  Inguinal hernia. This kind of hernia develops in the groin or scrotum.  Umbilical hernia. This kind of hernia develops near the navel.  Hiatal hernia. This kind of hernia causes part of the stomach to be pushed up into the chest.  Incisional  hernia. This kind of hernia bulges through a scar from an abdominal surgery. CAUSES This condition may be caused by:  Heavy lifting.  Coughing over a long period of time.  Straining to have a bowel movement.  An incision made during an abdominal surgery.  A birth defect (congenital defect).  Excess weight or obesity.  Smoking.  Poor nutrition.  Cystic fibrosis.  Excess fluid in the abdomen.  Undescended testicles. SYMPTOMS Symptoms of a hernia include:  A lump on the abdomen. This is the first sign of a hernia. The lump may become more obvious with standing, straining, or coughing. It may get bigger over time if it is not treated or if the condition causing it is not treated.  Pain. A hernia is usually painless, but it may become painful over time if treatment is delayed. The pain is usually dull and may get worse with standing or lifting heavy objects. Sometimes a hernia gets tightly squeezed in the weak spot (strangulated) or stuck there (incarcerated) and causes additional symptoms. These symptoms may include:  Vomiting.  Nausea.  Constipation.  Irritability. DIAGNOSIS A hernia may be diagnosed with:  A physical exam. During the exam your health care provider may ask you to cough or to make a specific movement, because a hernia is usually more visible when you move.  Imaging tests. These can include:  X-rays.  Ultrasound.  CT scan. TREATMENT A hernia that is small and painless may not need to be treated. A hernia that is large or painful may be treated with surgery. Inguinal hernias may be treated with surgery to prevent incarceration or strangulation. Strangulated hernias are always treated with surgery, because lack of blood to the trapped organ or tissue can cause it to die. Surgery to treat a hernia involves pushing the bulge back into place and repairing the weak part of the abdomen. HOME CARE INSTRUCTIONS  Avoid straining.  Do not lift anything  heavier than 10 lb (4.5 kg).  Lift with your leg muscles, not your back muscles. This helps avoid strain.  When coughing, try to cough gently.  Prevent constipation. Constipation leads to straining with bowel movements, which can make a hernia worse or cause a hernia repair to break down. You can prevent constipation by:  Eating a high-fiber diet that includes plenty of fruits and vegetables.  Drinking enough fluids to keep your urine clear or pale yellow. Aim to drink 6-8 glasses of water per day.  Using a  stool softener as directed by your health care provider.  Lose weight, if you are overweight.  Do not use any tobacco products, including cigarettes, chewing tobacco, or electronic cigarettes. If you need help quitting, ask your health care provider.  Keep all follow-up visits as directed by your health care provider. This is important. Your health care provider may need to monitor your condition. SEEK MEDICAL CARE IF:  You have swelling, redness, and pain in the affected area.  Your bowel habits change. SEEK IMMEDIATE MEDICAL CARE IF:  You have a fever.  You have abdominal pain that is getting worse.  You feel nauseous or you vomit.  You cannot push the hernia back in place by gently pressing on it while you are lying down.  The hernia:  Changes in shape or size.  Is stuck outside the abdomen.  Becomes discolored.  Feels hard or tender.   This information is not intended to replace advice given to you by your health care provider. Make sure you discuss any questions you have with your health care provider.   Document Released: 02/04/2005 Document Revised: 02/25/2014 Document Reviewed: 12/15/2013 Elsevier Interactive Patient Education 2016 [employer].   Stroke Prevention Some medical conditions and behaviors are associated with an increased chance of having a stroke. You may prevent a stroke by making healthy choices and managing medical conditions. HOW CAN I  REDUCE MY RISK OF HAVING A STROKE?   Stay physically active. Get at least 30 minutes of activity on most or all days.  Do not smoke. It may also be helpful to avoid exposure to secondhand smoke.  Limit alcohol use. Moderate alcohol use is considered to be:  No more than 2 drinks per day for men.  No more than 1 drink per day for nonpregnant women.  Eat healthy foods. This involves:  Eating 5 or more servings of fruits and vegetables a day.  Making dietary changes that address high blood pressure (hypertension), high cholesterol, diabetes, or obesity.  Manage your cholesterol levels.  Making food choices that are high in fiber and low in saturated fat, trans fat, and cholesterol may control cholesterol levels.  Take any prescribed medicines to control cholesterol as directed by your health care provider.  Manage your diabetes.  Controlling your carbohydrate and sugar intake is recommended to manage diabetes.  Take any prescribed medicines to control diabetes as directed by your health care provider.  Control your hypertension.  Making food choices that are low in salt (sodium), saturated fat, trans fat, and cholesterol is recommended to manage hypertension.  Ask your health care provider if you need treatment to lower your blood pressure. Take any prescribed medicines to control hypertension as directed by your health care provider.  If you are 61-45 years of age, have your blood pressure checked every 3-5 years. If you are 13 years of age or older, have your blood pressure checked every year.  Maintain a healthy weight.  Reducing calorie intake and making food choices that are low in sodium, saturated fat, trans fat, and cholesterol are recommended to manage weight.  Stop drug abuse.  Avoid taking birth control pills.  Talk to your health care provider about the risks of taking birth control pills if you are over 67 years old, smoke, get migraines, or have ever had a  blood clot.  Get evaluated for sleep disorders (sleep apnea).  Talk to your health care provider about getting a sleep evaluation if you snore a lot or have  excessive sleepiness.  Take medicines only as directed by your health care provider.  For some people, aspirin or blood thinners (anticoagulants) are helpful in reducing the risk of forming abnormal blood clots that can lead to stroke. If you have the irregular heart rhythm of atrial fibrillation, you should be on a blood thinner unless there is a good reason you cannot take them.  Understand all your medicine instructions.  Make sure that other conditions (such as anemia or atherosclerosis) are addressed. SEEK IMMEDIATE MEDICAL CARE IF:   You have sudden weakness or numbness of the face, arm, or leg, especially on one side of the body.  Your face or eyelid droops to one side.  You have sudden confusion.  You have trouble speaking (aphasia) or understanding.  You have sudden trouble seeing in one or both eyes.  You have sudden trouble walking.  You have dizziness.  You have a loss of balance or coordination.  You have a sudden, severe headache with no known cause.  You have new chest pain or an irregular heartbeat. Any of these symptoms may represent a serious problem that is an emergency. Do not wait to see if the symptoms will go away. Get medical help at once. Call your local emergency services (911 in [place] Do not drive yourself to the hospital.   This information is not intended to replace advice given to you by your health care provider. Make sure you discuss any questions you have with your health care provider.   Document Released: 03/14/2004 Document Revised: 02/25/2014 Document Reviewed: 08/07/2012 Elsevier Interactive Patient Education [employer].

## 2015-02-06 NOTE — Progress Notes (Signed)
Placed pt. On cpap. Pt. Tolerating well at this time. 

## 2015-02-06 NOTE — Anesthesia Procedure Notes (Signed)
Procedure Name: Intubation Date/Time: 02/06/2015 1:36 PM Performed by: Eligha Bridegroom Pre-anesthesia Checklist: Patient identified, Timeout performed, Emergency Drugs available, Suction available and Patient being monitored Patient Re-evaluated:Patient Re-evaluated prior to inductionOxygen Delivery Method: Circle system utilized Preoxygenation: Pre-oxygenation with 100% oxygen Intubation Type: IV induction Ventilation: Mask ventilation without difficulty Laryngoscope Size: 4 Grade View: Grade I Tube type: Oral Airway Equipment and Method: Stylet and LTA kit utilized Placement Confirmation: ETT inserted through vocal cords under direct vision Secured at: 22 cm Tube secured with: Tape Dental Injury: Teeth and Oropharynx as per pre-operative assessment

## 2015-02-06 NOTE — Op Note (Addendum)
02/06/2015  3:52 PM  PATIENT:  [name]  59 y.o. male  Patient Care Team: [name], MD as PCP - General (Internal Medicine)  PRE-OPERATIVE DIAGNOSIS:  VENTRAL HERNIA  POST-OPERATIVE DIAGNOSIS:  Ileal mass with partial obstruction & mesenteric lymphadenopathy  PROCEDURE:  Procedure(s): LAPAROSCOPY DIAGNOSTIC SMALL BOWEL RESECTION  Surgeon(s): Michael Boston, MD  ASSISTANT: RN   ANESTHESIA:   local and general  EBL:  Total I/O In: [id number] [I.V.:1100; IV Piggyback:250] Out: [id number] [Urine:230; Blood:125]  Delay start of Pharmacological VTE agent (>24hrs) due to surgical blood loss or risk of bleeding:  no  DRAINS: none   SPECIMEN:  Source of Specimen:  Proximal ileum with mesentery.  Lesion of proximal ileum  DISPOSITION OF SPECIMEN:  PATHOLOGY  COUNTS:  YES  PLAN OF CARE: Admit to inpatient   PATIENT DISPOSITION:  PACU - hemodynamically stable.  INDICATION: Pleasant obese male with right-sided intermittent swelling suspicious first began early in hernia.  I offered diagnostic laparoscopy with possible hernia repair:  The anatomy & physiology of the abdominal wall was discussed.  The pathophysiology of hernias was discussed.  Natural history risks without surgery including progeressive enlargement, pain, incarceration, & strangulation was discussed.   Contributors to complications such as smoking, obesity, diabetes, prior surgery, etc were discussed.   I feel the risks of no intervention will lead to serious problems that outweigh the operative risks; therefore, I recommended surgery to reduce and repair the hernia.  I explained laparoscopic techniques with possible need for an open approach.  I noted the probable use of mesh to patch and/or buttress the hernia repair  Risks such as bleeding, infection, abscess, need for further treatment, injury to other organs, need for repair of tissues / organs, stroke, heart attack, death, and other risks were discussed.  I  noted a good likelihood this will help address the problem.   Goals of post-operative recovery were discussed as well.  Possibility that this will not correct all symptoms was explained.  I stressed the importance of low-impact activity, aggressive pain control, avoiding constipation, & not pushing through pain to minimize risk of post-operative chronic pain or injury. Possibility of reherniation especially with smoking, obesity, diabetes, immunosuppression, and other health conditions was discussed.  We will work to minimize complications.     An educational handout further explaining the pathology & treatment options was given as well.  Questions were answered.  The patient expresses understanding & wishes to proceed with surgery.   OR FINDINGS: No evidence of any abdominal wall hernia.  No spegelian nor incisional hernia.  No inguinal or femoral hernias.  No umbilical hernia.  No diastases.  Mass at proximal ileum 3x2cm partially obstructing with thickening of mesentery suspicious for lymphadenopathy.  No evidence of any visceral / parietal lesions otherwise.  Not classic stellate pattern but suspicious for carcinoid.  Atypical fat deposits of mesentery of ileum.  No definite creeping fat.  No perforation.  Status post appendectomy.  DESCRIPTION:   Informed consent was confirmed.  The patient underwent general anaesthesia without difficulty.  The patient was positioned appropriately.  VTE prevention in place.  The patient's abdomen was clipped, prepped, & draped in a sterile fashion.  Surgical timeout confirmed our plan.  The patient was positioned in reverse Trendelenburg.  Abdominal entry with a 59mm laparoscopic port was gained using optical entry technique in the left upper abdomen.  Entry was clean.  I induced carbon dioxide insufflation.  Camera inspection revealed no injury.  Extra ports  were carefully placed under direct laparoscopic visualization.  There was no evidence of hernia to the  anterior abdominal wall.  There were a few adhesions in the right lower quadrant underneath the transverse right lower quadrant incision consistent with prior appendectomy.  I freed those off.  No hernia found.  No diastases.  No adhesions elsewhere.  I ran the small bowel from the ileocecal region more proximally.  Confirmed no more appendix.  About 2 feet proximal to the ileocecal region I felt a nodular mass intraluminal involving most of the ileum.  It was kinked down somewhat at the mesentery towards the retroperitoneum.  Suspicious for some thickening as well.  The rest of the small intestine was otherwise normal.  Stomach, liver, & gallbladder was normal.  No evidence of any varus or parietal nodularity.  I decided to explore since this was the region that he complained of intermittent swelling and discomfort.  I eviscerated the small bowel through a low midline incision protected with a wound protector initially 5 cm and expanded to 7 cm.  I began to find the ileocecal region to the small bowel proximally to confirm that the patient did have a 3 x 2 cm intraluminal nodule partially obstructing the proximal ileum.  There was thickening of the mesentery was some nodularity as well going towards the superior mesenteric artery and vein main pedicle.  My concern was that an effort to perform complete Renella Steig excision would involve most of the small bowel.  Therefore I excised a foot of small intestine with the mesentery that had the most obvious nodules, sparing the distal ileal mesentery.  I transected the mesentery using clamps and ties with some intermittent bipolar LigaSure.  I did a side-to-side staple anastomosis of distal jejunum to proximal ileum to resect the obvious intraluminal small bowel mass and obvious lymphadenopathy.  Closed the common staple defect of the side-to-side anastomosis with a TX 90 stapler.  I closed the mesenteric defect using intermittent silk suture.  I ran the small bowel in  open and laparoscopic fashion and saw no other abnormalities.  We did copious irrigation.  Hemostasis was good.  I evacuated carbon dioxide & removed the ports and wound protector.  I closed the infraumbilical midline fascial wound with #1 PDS in a running fashion.  Skin closed with 4 Monocryl suture with a few anabolic-soaked gauze wicks.  Small port sites closed with Monocryl and sterile dressings applied.  The patient's expanded recovery room.  I discussed intraoperative findings family.  Expected postoperative recovery and differential diagnosis was discussed.  Need for pathology review in the future discussed.  We will work on his ability to recover watch out for signs of ileus or other concerns.   Adin Hector, M.D., F.A.C.S. Gastrointestinal and Minimally Invasive Surgery [facility] [address] [place], [place] [id number] [phone] Main / Paging

## 2015-02-06 NOTE — Anesthesia Preprocedure Evaluation (Signed)
Anesthesia Evaluation  Patient identified by MRN, date of birth, ID band Patient awake    Reviewed: Allergy & Precautions, NPO status   Airway Mallampati: II  TM Distance: >3 FB Neck ROM: Full    Dental   Pulmonary sleep apnea and Continuous Positive Airway Pressure Ventilation , Current Smoker,    Pulmonary exam normal        Cardiovascular hypertension, Pt. on medications Normal cardiovascular exam     Neuro/Psych    GI/Hepatic   Endo/Other  diabetes, Type 2, Oral Hypoglycemic Agents  Renal/GU Renal InsufficiencyRenal disease     Musculoskeletal   Abdominal   Peds  Hematology   Anesthesia Other Findings   Reproductive/Obstetrics                             Anesthesia Physical Anesthesia Plan  ASA: II  Anesthesia Plan: General   Post-op Pain Management:    Induction: Intravenous  Airway Management Planned: Oral ETT  Additional Equipment:   Intra-op Plan:   Post-operative Plan: Extubation in OR  Informed Consent: I have reviewed the patients History and Physical, chart, labs and discussed the procedure including the risks, benefits and alternatives for the proposed anesthesia with the patient or authorized representative who has indicated his/her understanding and acceptance.     Plan Discussed with: CRNA and Surgeon  Anesthesia Plan Comments:         Anesthesia Quick Evaluation

## 2015-02-07 ENCOUNTER — Encounter (HOSPITAL_COMMUNITY): Payer: Self-pay | Admitting: Surgery

## 2015-02-07 LAB — BASIC METABOLIC PANEL
Anion gap: 6 (ref 5–15)
BUN: 18 mg/dL (ref 6–20)
CHLORIDE: 108 mmol/L (ref 101–111)
CO2: 23 mmol/L (ref 22–32)
CREATININE: 1.64 mg/dL — AB (ref 0.61–1.24)
Calcium: 8.6 mg/dL — ABNORMAL LOW (ref 8.9–10.3)
GFR calc non Af Amer: 45 mL/min — ABNORMAL LOW (ref >60)
GFR, EST AFRICAN AMERICAN: 52 mL/min — AB (ref >60)
Glucose, Bld: 91 mg/dL (ref 65–99)
POTASSIUM: 4.6 mmol/L (ref 3.5–5.1)
SODIUM: 137 mmol/L (ref 135–145)

## 2015-02-07 LAB — CBC
HCT: 38.3 % — ABNORMAL LOW (ref 39.0–52.0)
HEMOGLOBIN: 12.4 g/dL — AB (ref 13.0–17.0)
MCH: 30.7 pg (ref 26.0–34.0)
MCHC: 32.4 g/dL (ref 30.0–36.0)
MCV: 94.8 fL (ref 78.0–100.0)
Platelets: 151 10*3/uL (ref 150–400)
RBC: 4.04 MIL/uL — AB (ref 4.22–5.81)
RDW: 13.5 % (ref 11.5–15.5)
WBC: 12.1 10*3/uL — ABNORMAL HIGH (ref 4.0–10.5)

## 2015-02-07 LAB — MAGNESIUM: MAGNESIUM: 1.8 mg/dL (ref 1.7–2.4)

## 2015-02-07 MED ORDER — LACTATED RINGERS IV BOLUS (SEPSIS): 1000.0000 mL | Freq: Three times a day (TID) | INTRAVENOUS | Status: AC | PRN

## 2015-02-07 MED ORDER — LACTATED RINGERS IV BOLUS (SEPSIS)
1000.0000 mL | Freq: Once | INTRAVENOUS | Status: AC
  Administered 2015-02-07: 1000 mL via INTRAVENOUS

## 2015-02-07 NOTE — Progress Notes (Signed)
[facility]  [address] [place], [place] [address] Phone: [phone] FAX: [place] [phone] 06-24-1955   Assessment  Stable  Problem List:   Principal Problem:   Small bowel mass s/p SB resection 02/06/2015 Active Problems:   Diabetes mellitus with stage 3 chronic kidney disease ([place])   Hyperlipidemia   OSA (obstructive sleep apnea)   Diastolic dysfunction   Essential hypertension   Type 2 diabetes mellitus with other circulatory complications (HCC)   Tobacco use disorder   1 Day Post-Op  02/06/2015  Procedure(s): LAPAROSCOPY DIAGNOSTIC SMALL BOWEL RESECTION    Plan:  -adv diet to pureed, then solids.  Watch out for ileus -f/u path -CPAP for OSA -HTN control -VTE prophylaxis- SCDs, etc -mobilize as tolerated to help recovery  I updated the patient's status to the patient.  OR findings noted & with no hernia found & need for SB resection. Recommendations were made.  Questions were answered.  The patient expressed understanding & appreciation.   Adin Hector, M.D., F.A.C.S. Gastrointestinal and Minimally Invasive Surgery [facility] [address], [address] [place], [place] [address] [phone] Main / Paging   02/07/2015  Subjective:  Using CPAP OR findings noted Foley removed Tolerating thin liquids fine Pain mostly controlled  Objective:  Vital signs:  Filed Vitals:   02/06/15 2144 02/06/15 2153 02/07/15 0307 02/07/15 0525  BP:  114/65 101/54 107/52  Pulse:  69 60 62  Temp:  98.2 F (36.8 C) 98.2 F (36.8 C) 98.6 F (37 C)  TempSrc:  Oral Oral Oral  Resp: '17 20 18 20  '$ Weight:    115.2 kg (253 lb 15.5 oz)  SpO2:  99% 99% 99%    Last BM Date: 02/05/15  Intake/Output   Yesterday:  12/19 0701 - 12/20 0700 In: 2402.5 [P.O.:120; I.V.:2032.5; IV Piggyback:250] Out: 705 [Urine:580; Blood:125] This shift:     Bowel  function:  Flatus: n  BM: n  Drain: n/a  Physical Exam:  General: Pt awake/alert/oriented x4 in no acute distress Eyes: PERRL, normal EOM.  Sclera clear.  No icterus Neuro: CN II-XII intact w/o focal sensory/motor deficits. Lymph: No head/neck/groin lymphadenopathy Psych:  No delerium/psychosis/paranoia HENT: Normocephalic, Mucus membranes moist.  No thrush.  Nasal CPAP in place Neck: Supple, No tracheal deviation Chest: No chest wall pain w good excursion CV:  Pulses intact.  Regular rhythm MS: Normal AROM mjr joints.  No obvious deformity Abdomen: Soft.  Nondistended.  Mildly tender at incisions only.  No evidence of peritonitis.  No incarcerated hernias. Ext:  SCDs BLE.  No mjr edema.  No cyanosis Skin: No petechiae / purpura  Results:   Labs: Results for orders placed or performed during the hospital encounter of 02/06/15 (from the past 48 hour(s))  I-STAT 4, (NA,K, GLUC, HGB,HCT)     Status: None   Collection Time: 02/06/15 10:52 AM  Result Value Ref Range   Sodium 139 135 - 145 mmol/L   Potassium 4.4 3.5 - 5.1 mmol/L   Glucose, Bld 94 65 - 99 mg/dL   HCT 48.0 39.0 - 52.0 %   Hemoglobin 16.3 13.0 - 17.0 g/dL  Glucose, capillary     Status: Abnormal   Collection Time: 02/06/15  3:44 PM  Result Value Ref Range   Glucose-Capillary 122 (H) 65 - 99 mg/dL   Comment 1 Notify RN   Basic metabolic panel     Status: Abnormal   Collection  Time: 02/07/15  4:55 AM  Result Value Ref Range   Sodium 137 135 - 145 mmol/L   Potassium 4.6 3.5 - 5.1 mmol/L   Chloride 108 101 - 111 mmol/L   CO2 23 22 - 32 mmol/L   Glucose, Bld 91 65 - 99 mg/dL   BUN 18 6 - 20 mg/dL   Creatinine, Ser 1.64 (H) 0.61 - 1.24 mg/dL   Calcium 8.6 (L) 8.9 - 10.3 mg/dL   GFR calc non Af Amer 45 (L) >60 mL/min   GFR calc Af Amer 52 (L) >60 mL/min    Comment: (NOTE) The eGFR has been calculated using the CKD EPI equation. This calculation has not been validated in all clinical situations. eGFR's  persistently <60 mL/min signify possible Chronic Kidney Disease.    Anion gap 6 5 - 15  CBC     Status: Abnormal   Collection Time: 02/07/15  4:55 AM  Result Value Ref Range   WBC 12.1 (H) 4.0 - 10.5 K/uL   RBC 4.04 (L) 4.22 - 5.81 MIL/uL   Hemoglobin 12.4 (L) 13.0 - 17.0 g/dL    Comment: REPEATED TO VERIFY DELTA CHECK NOTED    HCT 38.3 (L) 39.0 - 52.0 %   MCV 94.8 78.0 - 100.0 fL   MCH 30.7 26.0 - 34.0 pg   MCHC 32.4 30.0 - 36.0 g/dL   RDW 13.5 11.5 - 15.5 %   Platelets 151 150 - 400 K/uL  Magnesium     Status: None   Collection Time: 02/07/15  4:55 AM  Result Value Ref Range   Magnesium 1.8 1.7 - 2.4 mg/dL    Imaging / Studies: No results found.  Medications / Allergies: per chart  Antibiotics: Anti-infectives    Start     Dose/Rate Route Frequency Ordered Stop   02/06/15 1930  cefoTEtan (CEFOTAN) 2 g in dextrose 5 % 50 mL IVPB     2 g 100 mL/hr over 30 Minutes Intravenous Every 12 hours 02/06/15 1656 02/06/15 2046   02/06/15 1500  metroNIDAZOLE (FLAGYL) IVPB 500 mg  Status:  Discontinued     500 mg 100 mL/hr over 60 Minutes Intravenous Every 8 hours 02/06/15 1448 02/06/15 1636   02/06/15 1000  ceFAZolin (ANCEF) IVPB 2 g/50 mL premix     2 g 100 mL/hr over 30 Minutes Intravenous To ShortStay Surgical 02/05/15 1540 02/06/15 1400        Note: Portions of this report may have been transcribed using voice recognition software. Every effort was made to ensure accuracy; however, inadvertent computerized transcription errors may be present.   Any transcriptional errors that result from this process are unintentional.     Adin Hector, M.D., F.A.C.S. Gastrointestinal and Minimally Invasive Surgery [facility] [address] [place] [address] [place], [place] [address] [phone] Main / Paging   02/07/2015  CARE TEAM:  PCP: [name], MD  Outpatient Care Team: Patient Care Team: [name], MD as PCP - General (Internal  Medicine)  Inpatient Treatment Team: Treatment Team: Attending Provider: Michael Boston, MD; Technician: Ronalee Red, NT; Registered Nurse: Jacqulyn Cane Rasing, RN; Technician: Nino Glow, NT; Technician: Romero Liner, NT

## 2015-02-07 NOTE — Care Management Note (Signed)
Case Management Note  Patient Details  Name: [name] MRN: [id number] Date of Birth: [birth year]  Subjective/Objective:                    Action/Plan:  UR updated  Expected Discharge Date:                  Expected Discharge Plan:     In-House Referral:     Discharge planning Services     Post Acute Care Choice:    Choice offered to:     DME Arranged:    DME Agency:     HH Arranged:    [place] Agency:     Status of Service:  In process, will continue to follow  Medicare Important Message Given:    Date Medicare IM Given:    Medicare IM give by:    Date Additional Medicare IM Given:    Additional Medicare Important Message give by:     If discussed at [place] of Stay Meetings, dates discussed:    Additional Comments:  Marilu Favre, RN 02/07/2015, 11:31 AM

## 2015-02-08 LAB — POTASSIUM: POTASSIUM: 4.5 mmol/L (ref 3.5–5.1)

## 2015-02-08 LAB — CREATININE, SERUM
CREATININE: 1.37 mg/dL — AB (ref 0.61–1.24)
GFR calc non Af Amer: 56 mL/min — ABNORMAL LOW (ref >60)

## 2015-02-08 LAB — HEMOGLOBIN: HEMOGLOBIN: 11.6 g/dL — AB (ref 13.0–17.0)

## 2015-02-08 MED ORDER — LACTATED RINGERS IV BOLUS (SEPSIS): 1000.0000 mL | Freq: Three times a day (TID) | INTRAVENOUS | Status: DC | PRN

## 2015-02-08 MED ORDER — OXYCODONE HCL 5 MG PO TABS
5.0000 mg | ORAL_TABLET | ORAL | Status: DC | PRN
  Administered 2015-02-08 (×3): 10 mg via ORAL
  Administered 2015-02-08: 5 mg via ORAL
  Administered 2015-02-09 (×2): 10 mg via ORAL
  Filled 2015-02-08 (×6): qty 2

## 2015-02-08 MED ORDER — SODIUM CHLORIDE 0.9 % IJ SOLN
3.0000 mL | Freq: Two times a day (BID) | INTRAMUSCULAR | Status: DC
  Administered 2015-02-08 (×2): 3 mL via INTRAVENOUS

## 2015-02-08 MED ORDER — SODIUM CHLORIDE 0.9 % IV SOLN: 250.0000 mL | INTRAVENOUS | Status: DC | PRN

## 2015-02-08 MED ORDER — SODIUM CHLORIDE 0.9 % IJ SOLN: 3.0000 mL | INTRAMUSCULAR | Status: DC | PRN

## 2015-02-08 NOTE — Progress Notes (Signed)
[facility]  [address] [place], [place] [address] Phone: [phone] FAX: [place] [phone] [birth year]   Assessment  Improving  Problem List:   Principal Problem:   Small bowel mass s/p SB resection 02/06/2015 Active Problems:   Diabetes mellitus with stage 3 chronic kidney disease ([place])   Hyperlipidemia   OSA (obstructive sleep apnea)   Diastolic dysfunction   Essential hypertension   Type 2 diabetes mellitus with other circulatory complications (HCC)   Tobacco use disorder   2 Days Post-Op  02/06/2015  Procedure(s): LAPAROSCOPY DIAGNOSTIC SMALL BOWEL RESECTION    Plan:  -adv diet to solids.  Watch out for ileus -f/u path -CPAP for OSA -HTN control -VTE prophylaxis- SCDs, etc -mobilize as tolerated to help recovery  D/C patient from hospital when patient meets criteria (anticipate in 1-2 day(s)):  Tolerating oral intake well Ambulating well Adequate pain control without IV medications Urinating  Having flatus Disposition planning in place   I updated the patient's status to the patient.  OR findings noted & with no hernia found & need for SB resection. Recommendations were made.  Questions were answered.  The patient expressed understanding & appreciation.   Adin Hector, M.D., F.A.C.S. Gastrointestinal and Minimally Invasive Surgery [facility] [address] [place], [place] [address] [phone] Main / Paging   02/08/2015  Subjective:  Using CPAP OR findings noted Foley removed Tolerating full liquids well.  hungry Pain controlled Walking in hallways  Objective:  Vital signs:  Filed Vitals:   02/07/15 1500 02/07/15 2116 02/07/15 2137 02/08/15 0458  BP:  129/59  120/63  Pulse:  60 60 63  Temp:  98.3 F (36.8 C)  98.2 F (36.8 C)  TempSrc:  Oral  Oral  Resp:  19  20  Height: 6' (1.829 m)     Weight:    112.7 kg  (248 lb 7.3 oz)  SpO2:  100% 95% 99%    Last BM Date: 02/05/15  Intake/Output   Yesterday:  12/20 0701 - 12/21 0700 In: 2620 [P.O.:820; I.V.:1559; IV Piggyback:1000] Out: 1360 [Urine:1360] This shift:     Bowel function:  Flatus: n  BM: n  Drain: n/a  Physical Exam:  General: Pt awake/alert/oriented x4 in no acute distress.  Smiling, walking in hallways Eyes: PERRL, normal EOM.  Sclera clear.  No icterus Neuro: CN II-XII intact w/o focal sensory/motor deficits. Lymph: No head/neck/groin lymphadenopathy Psych:  No delerium/psychosis/paranoia HENT: Normocephalic, Mucus membranes moist.  No thrush.  Nasal CPAP in place Neck: Supple, No tracheal deviation Chest: No chest wall pain w good excursion CV:  Pulses intact.  Regular rhythm MS: Normal AROM mjr joints.  No obvious deformity Abdomen: Soft.  Nondistended.  Mildly tender at incisions only.  No evidence of peritonitis.  No incarcerated hernias. Ext:  SCDs BLE.  No mjr edema.  No cyanosis Skin: No petechiae / purpura  Results:   Labs: Results for orders placed or performed during the hospital encounter of 02/06/15 (from the past 48 hour(s))  I-STAT 4, (NA,K, GLUC, HGB,HCT)     Status: None   Collection Time: 02/06/15 10:52 AM  Result Value Ref Range   Sodium 139 135 - 145 mmol/L   Potassium 4.4 3.5 - 5.1 mmol/L   Glucose, Bld 94 65 - 99 mg/dL   HCT 48.0 39.0 - 52.0 %   Hemoglobin 16.3 13.0 - 17.0 g/dL  Glucose, capillary  Status: Abnormal   Collection Time: 02/06/15  3:44 PM  Result Value Ref Range   Glucose-Capillary 122 (H) 65 - 99 mg/dL   Comment 1 Notify RN   Basic metabolic panel     Status: Abnormal   Collection Time: 02/07/15  4:55 AM  Result Value Ref Range   Sodium 137 135 - 145 mmol/L   Potassium 4.6 3.5 - 5.1 mmol/L   Chloride 108 101 - 111 mmol/L   CO2 23 22 - 32 mmol/L   Glucose, Bld 91 65 - 99 mg/dL   BUN 18 6 - 20 mg/dL   Creatinine, Ser 1.64 (H) 0.61 - 1.24 mg/dL   Calcium 8.6 (L) 8.9 -  10.3 mg/dL   GFR calc non Af Amer 45 (L) >60 mL/min   GFR calc Af Amer 52 (L) >60 mL/min    Comment: (NOTE) The eGFR has been calculated using the CKD EPI equation. This calculation has not been validated in all clinical situations. eGFR's persistently <60 mL/min signify possible Chronic Kidney Disease.    Anion gap 6 5 - 15  CBC     Status: Abnormal   Collection Time: 02/07/15  4:55 AM  Result Value Ref Range   WBC 12.1 (H) 4.0 - 10.5 K/uL   RBC 4.04 (L) 4.22 - 5.81 MIL/uL   Hemoglobin 12.4 (L) 13.0 - 17.0 g/dL    Comment: REPEATED TO VERIFY DELTA CHECK NOTED    HCT 38.3 (L) 39.0 - 52.0 %   MCV 94.8 78.0 - 100.0 fL   MCH 30.7 26.0 - 34.0 pg   MCHC 32.4 30.0 - 36.0 g/dL   RDW 13.5 11.5 - 15.5 %   Platelets 151 150 - 400 K/uL  Magnesium     Status: None   Collection Time: 02/07/15  4:55 AM  Result Value Ref Range   Magnesium 1.8 1.7 - 2.4 mg/dL  Hemoglobin     Status: Abnormal   Collection Time: 02/08/15  5:44 AM  Result Value Ref Range   Hemoglobin 11.6 (L) 13.0 - 17.0 g/dL  Creatinine, serum     Status: Abnormal   Collection Time: 02/08/15  5:44 AM  Result Value Ref Range   Creatinine, Ser 1.37 (H) 0.61 - 1.24 mg/dL   GFR calc non Af Amer 56 (L) >60 mL/min   GFR calc Af Amer >60 >60 mL/min    Comment: (NOTE) The eGFR has been calculated using the CKD EPI equation. This calculation has not been validated in all clinical situations. eGFR's persistently <60 mL/min signify possible Chronic Kidney Disease.   Potassium     Status: None   Collection Time: 02/08/15  5:44 AM  Result Value Ref Range   Potassium 4.5 3.5 - 5.1 mmol/L    Imaging / Studies: No results found.  Medications / Allergies: per chart  Antibiotics: Anti-infectives    Start     Dose/Rate Route Frequency Ordered Stop   02/06/15 1930  cefoTEtan (CEFOTAN) 2 g in dextrose 5 % 50 mL IVPB     2 g 100 mL/hr over 30 Minutes Intravenous Every 12 hours 02/06/15 1656 02/06/15 2046   02/06/15 1500   metroNIDAZOLE (FLAGYL) IVPB 500 mg  Status:  Discontinued     500 mg 100 mL/hr over 60 Minutes Intravenous Every 8 hours 02/06/15 1448 02/06/15 1636   02/06/15 1000  ceFAZolin (ANCEF) IVPB 2 g/50 mL premix     2 g 100 mL/hr over 30 Minutes Intravenous To ShortStay Surgical 02/05/15 1540 02/06/15   1400        Note: Portions of this report may have been transcribed using voice recognition software. Every effort was made to ensure accuracy; however, inadvertent computerized transcription errors may be present.   Any transcriptional errors that result from this process are unintentional.     Steven C. Gross, M.D., F.A.C.S. Gastrointestinal and Minimally Invasive Surgery [facility] [address] [place], [place] [address] [phone] Main / Paging   02/08/2015  CARE TEAM:  PCP: Nathan Boswell, MD  Outpatient Care Team: Patient Care Team: Nathan Boswell, MD as PCP - General (Internal Medicine)  Inpatient Treatment Team: Treatment Team: Attending Provider: Steven Gross, MD; Technician: Deborah J Brown, NT; Registered Nurse: Maria Christina Rasing, RN; Technician: Mara L Addison, NT; Technician: Olga E Marte, NT; Technician: Evelia D Martinez-Benitez, NT; Registered Nurse: Sondra A Tilman, RN  

## 2015-02-09 DIAGNOSIS — C7A8 Other malignant neuroendocrine tumors: Secondary | ICD-10-CM

## 2015-02-09 MED ORDER — HYDROMORPHONE HCL 1 MG/ML IJ SOLN: 0.5000 mg | INTRAMUSCULAR | Status: DC | PRN

## 2015-02-09 NOTE — Progress Notes (Signed)
[name] to be D/C'd home per MD order. Discussed with the patient and all questions fully answered.  VSS, Surgical site clean, dry, intact with liquid skin adhesive in place.  IV catheter discontinued intact. Site without signs and symptoms of complications. Dressing and pressure applied.  An After Visit Summary was printed and given to the patient. Patient received prescription.  D/c education completed with patient/family including follow up instructions, medication list, d/c activities limitations if indicated, with other d/c instructions as indicated by MD - patient able to verbalize understanding, all questions fully answered.   Patient instructed to return to ED, call 911, or call MD for any changes in condition.   Patient to be escorted via [place], and D/C home via private auto.

## 2015-02-09 NOTE — Progress Notes (Signed)
[facility]  [address] [place], [place] [address] Phone: [phone] FAX: [place] [phone] 11/14/1955   Assessment  Improving  Problem List:   Principal Problem:   Primary malignant neuroendocrine neoplasm of ileum pT3, pN1 s/p SB resection 02/06/2015 Active Problems:   Diabetes mellitus with stage 3 chronic kidney disease ([place])   Hyperlipidemia   OSA (obstructive sleep apnea)   Diastolic dysfunction   Essential hypertension   Type 2 diabetes mellitus with other circulatory complications ([place])   Tobacco use disorder   Small bowel mass s/p SB resection 02/06/2015   3 Days Post-Op  02/06/2015  Procedure(s): LAPAROSCOPY DIAGNOSTIC SMALL BOWEL RESECTION    Plan:  -adv diet to heart healthy solid. -path c/w small ME tumor w LN met.  Set up GI tumor board - most likely slow carcinoid & observe only but need to f/u, -CPAP for OSA -HTN control -VTE prophylaxis- SCDs, etc -mobilize as tolerated to help recovery  D/C patient from hospital when patient meets criteria (anticipate later today):  Tolerating oral intake well Ambulating well Adequate pain control without IV medications Urinating  Having flatus Disposition planning in place   I updated the patient's status to the patient.  Recommendations were made.  Questions were answered.  The patient expressed understanding & appreciation.   Adin Hector, M.D., F.A.C.S. Gastrointestinal and Minimally Invasive Surgery [facility] [address] [place], [place] [address] [phone] Main / Paging   02/09/2015  Subjective:  Using CPAP C/o abd cramping, then had BM in middle of night Tolerating soft foods Pain controlled Walking in hallways  Objective:  Vital signs:  Filed Vitals:   02/08/15 0458 02/08/15 1612 02/08/15 2105 02/08/15 2145  BP: 120/63 140/65 148/74   Pulse: 63 61 65 70   Temp: 98.2 F (36.8 C) 98 F (36.7 C) 98.6 F (37 C)   TempSrc: Oral Oral Oral   Resp: '20 20 18 18  '$ Height:      Weight: 112.7 kg (248 lb 7.3 oz)     SpO2: 99% 98% 99% 97%    Last BM Date: 02/09/15  Intake/Output   Yesterday:  12/21 0701 - 12/22 0700 In: 3335 [P.O.:1730; I.V.:3] Out: -  This shift:  Total I/O In: 483 [P.O.:480; I.V.:3] Out: -   Bowel function:  Flatus: Yes  BM: Yes  Drain: n/a  Physical Exam:  General: Pt awake/alert/oriented x4 in no acute distress.  Calm Eyes: PERRL, normal EOM.  Sclera clear.  No icterus Neuro: CN II-XII intact w/o focal sensory/motor deficits. Lymph: No head/neck/groin lymphadenopathy Psych:  No delerium/psychosis/paranoia HENT: Normocephalic, Mucus membranes moist.  No thrush.  Nasal CPAP in place Neck: Supple, No tracheal deviation Chest: No chest wall pain w good excursion CV:  Pulses intact.  Regular rhythm MS: Normal AROM mjr joints.  No obvious deformity Abdomen: Soft.  Nondistended.  Dressing & wicks removed.  Mildly tender at incisions only.  No evidence of peritonitis.  No incarcerated hernias. Ext:  SCDs BLE.  No mjr edema.  No cyanosis Skin: No petechiae / purpura  Results:   Labs: Results for orders placed or performed during the hospital encounter of 02/06/15 (from the past 48 hour(s))  Hemoglobin     Status: Abnormal   Collection Time: 02/08/15  5:44 AM  Result Value Ref Range   Hemoglobin 11.6 (L) 13.0 - 17.0 g/dL  Creatinine, serum     Status: Abnormal  Collection Time: 02/08/15  5:44 AM  Result Value Ref Range   Creatinine, Ser 1.37 (H) 0.61 - 1.24 mg/dL   GFR calc non Af Amer 56 (L) >60 mL/min   GFR calc Af Amer >60 >60 mL/min    Comment: (NOTE) The eGFR has been calculated using the CKD EPI equation. This calculation has not been validated in all clinical situations. eGFR's persistently <60 mL/min signify possible Chronic Kidney Disease.   Potassium     Status: None   Collection Time:  02/08/15  5:44 AM  Result Value Ref Range   Potassium 4.5 3.5 - 5.1 mmol/L    Imaging / Studies: No results found.  Medications / Allergies: per chart  Antibiotics: Anti-infectives    Start     Dose/Rate Route Frequency Ordered Stop   02/06/15 1930  cefoTEtan (CEFOTAN) 2 g in dextrose 5 % 50 mL IVPB     2 g 100 mL/hr over 30 Minutes Intravenous Every 12 hours 02/06/15 1656 02/06/15 2046   02/06/15 1500  metroNIDAZOLE (FLAGYL) IVPB 500 mg  Status:  Discontinued     500 mg 100 mL/hr over 60 Minutes Intravenous Every 8 hours 02/06/15 1448 02/06/15 1636   02/06/15 1000  ceFAZolin (ANCEF) IVPB 2 g/50 mL premix     2 g 100 mL/hr over 30 Minutes Intravenous To [facility] 02/05/15 1540 02/06/15 1400      Diagnosis Small intestine, resection for tumor, ileal mass ? - LOW GRADE NEUROENDOCRINE TUMOR, 1.8 CM. - MARGINS NOT INVOLVED. - TUMOR INVADES SUBSEROSAL CONNECTIVE TISSUE. - MESENTERIC NODULE OF LOW GRADE NEUROENDOCRINE TUMOR, 0.8 CM. Microscopic Comment SMALL INTESTINE AND AMPULLA - NEUROENDOCRINE: Specimen: Ileum Procedure: Resection. Tumor Site: Ileum Tumor Size: 1.8 cm Tumor Focality: Unifocal Histologic Type: Low grade neuroendocrine tumor (carcinoid tumor) Histologic Grade : Low grade, G1 Mitotic Rate Less than 1/10 high-power fields (HIGH POWER FIELD) Microscopic Tumor Extension: Small Intestine: Into subserosal connective tissue Margins: Free of tumor. Small Intestine (only): Proximal Margin: Free of tumor. Distal Margin: Free of tumor Mesenteric (Radial) Margin: Free of tumor. Lymph-Vascular Invasion: Present. Perineural Invasion: Present. Lymph nodes: number examined 1; number positive: 1, see comment. TNM Staging: pT3, pN1, see comment. Additional findings: Comments: The tumor within the ileum is a 1.8 cm in greatest dimension low grade neuroendocrine tumor (carcinoid 1 of 2 FINAL for [name], [name] [id number]) Microscopic  Comment(continued) tumor) which extends through the muscularis propria into the subserosal connective tissue. There is a 0.8 cm partially calcified and fibrotic nodule of low grade neuroendocrine within the attached adipose tissue which is presumed to be a lymph node completed replaced by tumor although no residual lymphoid tissue is identified with this nodule. (JDP:gt, 02/08/15) Claudette Laws MD Pathologist, Electronic Signature (Case signed 02/08/2015) Specimen Izeyah Deike and Clinical Information Specimen(s) Obtained: Small intestine, resection for tumor, ileal mass ? Specimen Clinical Information ventral hernia (cm) Janyla Biscoe The specimen is received in formalin, consists of an unoriented portion of small intestine, with two opened resection margins, measuring 37.8 cm in length. The serosal surface is tan pink and smooth, with a 1.9 x 1.5 cm area of red gray discoloration and serosal disruption identified, measuring 1.5 cm to the closest resection margin. At the opposite end of the specimen, suture material is present, and clinically designates the area of interest. The lumen is filled with a small amount of green yellow partially digested material, and the mucosa is tan pink with normal folding. On the mucosal aspect underlying the suture, a submucosal  area of firm, flattened tissue is identified and measuring 1.8 x 1.6 cm. The area of interest measures 9.1 cm to the closest resection margin. The mucosa underlying the serosa defect is disrupted with gray brown, hemorrhagic discoloration identified. The serosal surface underlying the lesion is inked black, and the lesion is serially sectioned to reveal a tan yellow, firm, solid nodule underling the mucosal surface. The lesion grossly appears to involve the submucosa and muscularis propria. No other mucosal lesions are grossly identified. Adipose tissue adjacent to the lesion, is a 0.8 cm tan white, firm, focally calcified ill-defined nodule  is identified. No lymph nodes are grossly identified. Representative sections are submitted in eight cassettes. A = resection margin closest to lesion B = opposite resection margin C = sections of mucosal and serosal disruption D-F = lesion G = grossly unremarkable mucosa H = adipose nodule, following decalcification (KL:ds 02/07/15) Report signed out from the following location(s) Technical component and interpretation was performed at [facility] [place], [place], [place] [address]. CLIA #: [id number], 2 of 2  Note: Portions of this report may have been transcribed using voice recognition software. Every effort was made to ensure accuracy; however, inadvertent computerized transcription errors may be present.   Any transcriptional errors that result from this process are unintentional.     Adin Hector, M.D., F.A.C.S. Gastrointestinal and Minimally Invasive Surgery [facility] [address], [place], [place] [address] [phone] Main / Paging   02/09/2015  CARE TEAM:  PCP: [name], MD  Outpatient Care Team: Patient Care Team: [name], MD as PCP - General (Internal Medicine)  Inpatient Treatment Team: Treatment Team: Attending Provider: Michael Boston, MD; Technician: Ronalee Red, NT; Technician: Nino Glow, NT; Technician: Romero Liner, NT; Technician: Paula Compton, NT; Registered Nurse: Claiborne Billings, RN; Registered Nurse: Vidal Schwalbe, RN; Technician: Maye Hides, NT (Inactive)

## 2015-02-09 NOTE — Discharge Summary (Signed)
Physician Discharge Summary  Patient ID: [name] MRN: [id number] DOB/AGE: [birth year] 59 y.o.  Admit date: 02/06/2015 Discharge date: 02/09/2015  Patient Care Team: Chad Nose, MD as PCP - General (Internal Medicine) Chad Soda, MD as Consulting Physician (General Surgery) Chad Boop, MD as Consulting Physician (Gastroenterology) Chad Plan, MD as Consulting Physician (Neurology) Chad Salvia, MD as Consulting Physician (Cardiology)  Admission Diagnoses: Principal Problem:   Primary malignant neuroendocrine neoplasm of ileum pT3, pN1 s/p SB resection 02/06/2015 Active Problems:   Diabetes mellitus with stage 3 chronic kidney disease (HCC)   Hyperlipidemia   OSA (obstructive sleep apnea)   Diastolic dysfunction   Essential hypertension   Type 2 diabetes mellitus with other circulatory complications (HCC)   Tobacco use disorder   Discharge Diagnoses:  Principal Problem:   Primary malignant neuroendocrine neoplasm of ileum pT3, pN1 s/p SB resection 02/06/2015 Active Problems:   Diabetes mellitus with stage 3 chronic kidney disease (HCC)   Hyperlipidemia   OSA (obstructive sleep apnea)   Diastolic dysfunction   Essential hypertension   Type 2 diabetes mellitus with other circulatory complications (HCC)   Tobacco use disorder   POST-OPERATIVE DIAGNOSIS:   ILEAL MASS  SURGERY:  02/06/2015  Procedure(s): LAPAROSCOPY DIAGNOSTIC SMALL BOWEL RESECTION  SURGEON:    Surgeon(s): Chad Soda, MD  Diagnosis Small intestine, resection for tumor, ileal mass ? - LOW GRADE NEUROENDOCRINE TUMOR, 1.8 CM. - MARGINS NOT INVOLVED. - TUMOR INVADES SUBSEROSAL CONNECTIVE TISSUE. - MESENTERIC NODULE OF LOW GRADE NEUROENDOCRINE TUMOR, 0.8 CM. Microscopic Comment SMALL INTESTINE AND AMPULLA - NEUROENDOCRINE: Specimen: Ileum Procedure: Resection. Tumor Site: Ileum Tumor Size: 1.8 cm Tumor Focality: Unifocal Histologic Type: Low grade neuroendocrine tumor  (carcinoid tumor) Histologic Grade : Low grade, G1 Mitotic Rate Less than 1/10 high-power fields (HIGH POWER FIELD) Microscopic Tumor Extension: Small Intestine: Into subserosal connective tissue Margins: Free of tumor. Small Intestine (only): Proximal Margin: Free of tumor. Distal Margin: Free of tumor Mesenteric (Radial) Margin: Free of tumor. Lymph-Vascular Invasion: Present. Perineural Invasion: Present. Lymph nodes: number examined 1; number positive: 1, see comment. TNM Staging: pT3, pN1, see comment. Additional findings: Comments: The tumor within the ileum is a 1.8 cm in greatest dimension low grade neuroendocrine tumor (carcinoid 1 of 2 FINAL for [name] [phone]) Microscopic Comment(continued) tumor) which extends through the muscularis propria into the subserosal connective tissue. There is a 0.8 cm partially calcified and fibrotic nodule of low grade neuroendocrine within the attached adipose tissue which is presumed to be a lymph node completed replaced by tumor although no residual lymphoid tissue is identified with this nodule. (JDP:gt, 02/08/15) Chad Picket MD Pathologist, Electronic Signature (Case signed 02/08/2015) Specimen [name] and Clinical Information Specimen(s) Obtained: Small intestine, resection for tumor, ileal mass ? Specimen Clinical Information ventral hernia (cm) [name] The specimen is received in formalin, consists of an unoriented portion of small intestine, with two opened resection margins, measuring 37.8 cm in length. The serosal surface is tan pink and smooth, with a 1.9 x 1.5 cm area of red gray discoloration and serosal disruption identified, measuring 1.5 cm to the closest resection margin. At the opposite end of the specimen, suture material is present, and clinically designates the area of interest. The lumen is filled with a small amount of green yellow partially digested material, and the mucosa is tan pink with normal  folding. On the mucosal aspect underlying the suture, a submucosal area of firm, flattened tissue is identified and measuring 1.8 x 1.6 cm.  The area of interest measures 9.1 cm to the closest resection margin. The mucosa underlying the serosa defect is disrupted with gray brown, hemorrhagic discoloration identified. The serosal surface underlying the lesion is inked black, and the lesion is serially sectioned to reveal a tan yellow, firm, solid nodule underling the mucosal surface. The lesion grossly appears to involve the submucosa and muscularis propria. No other mucosal lesions are grossly identified. Adipose tissue adjacent to the lesion, is a 0.8 cm tan white, firm, focally calcified ill-defined nodule is identified. No lymph nodes are grossly identified. Representative sections are submitted in eight cassettes. A = resection margin closest to lesion B = opposite resection margin C = sections of mucosal and serosal disruption D-F = lesion G = grossly unremarkable mucosa H = adipose nodule, following decalcification (KL:ds 02/07/15) Report signed out from the following location(s) Technical component and interpretation was performed at [name] [name], [name],  [id number]. CLIA #: [id number], 2 of 2  Consults: None  Hospital Course:   The patient underwent the surgery above.  He had felt fullness in his right end abdomen.  Concern for possible hernia but turned out to be small bowel mass with mesenteric thickening.  Postoperatively, the patient gradually mobilized and advanced to a solid diet.  Pain and other symptoms were treated aggressively.    By the time of discharge, the patient was walking well the hallways, eating food, having flatus & BMs.  Pain was well-controlled on an oral medications.  Based on meeting discharge criteria and continuing to recover, I felt it was safe for the patient to be discharged from the hospital to further recover with  close followup. Postoperative recommendations were discussed in detail.  They are written as well.   Significant Diagnostic Studies:  Results for orders placed or performed during the hospital encounter of 02/06/15 (from the past 72 hour(s))  I-STAT 4, (NA,K, GLUC, HGB,HCT)     Status: None   Collection Time: 02/06/15 10:52 AM  Result Value Ref Range   Sodium 139 135 - 145 mmol/L   Potassium 4.4 3.5 - 5.1 mmol/L   Glucose, Bld 94 65 - 99 mg/dL   HCT 48.0 39.0 - 52.0 %   Hemoglobin 16.3 13.0 - 17.0 g/dL  Glucose, capillary     Status: Abnormal   Collection Time: 02/06/15  3:44 PM  Result Value Ref Range   Glucose-Capillary 122 (H) 65 - 99 mg/dL   Comment 1 Notify RN   Basic metabolic panel     Status: Abnormal   Collection Time: 02/07/15  4:55 AM  Result Value Ref Range   Sodium 137 135 - 145 mmol/L   Potassium 4.6 3.5 - 5.1 mmol/L   Chloride 108 101 - 111 mmol/L   CO2 23 22 - 32 mmol/L   Glucose, Bld 91 65 - 99 mg/dL   BUN 18 6 - 20 mg/dL   Creatinine, Ser 1.64 (H) 0.61 - 1.24 mg/dL   Calcium 8.6 (L) 8.9 - 10.3 mg/dL   GFR calc non Af Amer 45 (L) >60 mL/min   GFR calc Af Amer 52 (L) >60 mL/min    Comment: (NOTE) The eGFR has been calculated using the CKD EPI equation. This calculation has not been validated in all clinical situations. eGFR's persistently <60 mL/min signify possible Chronic Kidney Disease.    Anion gap 6 5 - 15  CBC     Status: Abnormal   Collection Time: 02/07/15  4:55 AM  Result Value Ref Range   WBC 12.1 (H) 4.0 - 10.5 K/uL   RBC 4.04 (L) 4.22 - 5.81 MIL/uL   Hemoglobin 12.4 (L) 13.0 - 17.0 g/dL    Comment: REPEATED TO VERIFY DELTA CHECK NOTED    HCT 38.3 (L) 39.0 - 52.0 %   MCV 94.8 78.0 - 100.0 fL   MCH 30.7 26.0 - 34.0 pg   MCHC 32.4 30.0 - 36.0 g/dL   RDW 13.5 11.5 - 15.5 %   Platelets 151 150 - 400 K/uL  Magnesium     Status: None   Collection Time: 02/07/15  4:55 AM  Result Value Ref Range   Magnesium 1.8 1.7 - 2.4 mg/dL  Hemoglobin      Status: Abnormal   Collection Time: 02/08/15  5:44 AM  Result Value Ref Range   Hemoglobin 11.6 (L) 13.0 - 17.0 g/dL  Creatinine, serum     Status: Abnormal   Collection Time: 02/08/15  5:44 AM  Result Value Ref Range   Creatinine, Ser 1.37 (H) 0.61 - 1.24 mg/dL   GFR calc non Af Amer 56 (L) >60 mL/min   GFR calc Af Amer >60 >60 mL/min    Comment: (NOTE) The eGFR has been calculated using the CKD EPI equation. This calculation has not been validated in all clinical situations. eGFR's persistently <60 mL/min signify possible Chronic Kidney Disease.   Potassium     Status: None   Collection Time: 02/08/15  5:44 AM  Result Value Ref Range   Potassium 4.5 3.5 - 5.1 mmol/L    No results found.  Discharge Exam: Blood pressure 136/61, pulse 65, temperature 98.7 F (37.1 C), temperature source Oral, resp. rate 18, height 6' (1.829 m), weight 110.043 kg (242 lb 9.6 oz), SpO2 97 %.  General: Pt awake/alert/oriented x4 in no major acute distress Eyes: PERRL, normal EOM. Sclera nonicteric Neuro: CN II-XII intact w/o focal sensory/motor deficits. Lymph: No head/neck/groin lymphadenopathy Psych:  No delerium/psychosis/paranoia HENT: Normocephalic, Mucus membranes moist.  No thrush Neck: Supple, No tracheal deviation Chest: No pain.  Good respiratory excursion. CV:  Pulses intact.  Regular rhythm MS: Normal AROM mjr joints.  No obvious deformity Abdomen: Soft, Nondistended.  Min tender at incision only.  No incarcerated hernias. Ext:  SCDs BLE.  No significant edema.  No cyanosis Skin: No petechiae / purpura  Discharged Condition: good   Past Medical History  Diagnosis Date  . Hypertension   . Hyperlipemia   . CVA (cerebral vascular accident) [facility]) 2009,& September 03, 2014  . Hx of adenomatous colonic polyps 01/15/2015  . Cough   . Shortness of breath dyspnea   . Depression   . Anxiety   . Cerebrovascular accident ([place]) 11/07/2014  . Left sided lacunar stroke [facility])     1st  stroke in 2009 2nd stroke July 2016: Acute lacunar type infarct extending from the left corona radiata through the left external capsule Thought to be due to small vessel disease     . Pneumonia ~ 2013 & 2014 X 2  . OSA on CPAP   . Diabetes type 2, controlled ([place])     controlled by lifestyle modification  . Arthritis     "generalized; not too bad" (02/06/2015)  . Diabetic peripheral neuropathy ([place])   . History of gout   . Chronic kidney disease     abn labs; "related to Metformin I was taking"    Past Surgical History  Procedure Laterality Date  . Knee arthroscopy Left  X 2  . Carotid endarterectomy Left 11/2007    mptes 6132-11  . Hernia repair    . Laparoscopic small bowel resection  02/06/2015  . Laparoscopic assisted ventral hernia repair  02/06/2015  . Appendectomy  1980's  . Cardiac catheterization  2016  . Laparoscopy N/A 02/06/2015    Procedure: LAPAROSCOPY DIAGNOSTIC;  Surgeon: Michael Boston, MD;  Location: [place];  Service: General;  Laterality: N/A;  . Bowel resection N/A 02/06/2015    Procedure: SMALL BOWEL RESECTION;  Surgeon: Michael Boston, MD;  Location: [place];  Service: General;  Laterality: N/A;    Social History   Social History  . Marital Status: Married    Spouse Name: N/A  . Number of Children: 1  . Years of Education: 12   Occupational History  . N/A    Social History Main Topics  . Smoking status: Current Every Day Smoker -- 1.00 packs/day for 30 years    Types: Cigarettes  . Smokeless tobacco: Never Used  . Alcohol Use: No  . Drug Use: No  . Sexual Activity: No   Other Topics Concern  . Not on file   Social History Narrative   Lives alone in [place], [place] (2006)   Drinks about 3 diet sodas a day     Family History  Problem Relation Age of Onset  . Stroke Father   . Hypertension Father   . Diabetes Father   . Heart failure Father   . Colon polyps Father   . Hypertension Mother   . Diabetes Mother   . Heart failure Mother   . Colon  polyps Mother   . Hypertension Sister   . Hypertension Brother   . Diabetes Sister   . Diabetes Brother   . Colon cancer Neg Hx     Current Facility-Administered Medications  Medication Dose Route Frequency Provider Last Rate Last Dose  . 0.9 %  sodium chloride infusion  250 mL Intravenous PRN Michael Boston, MD      . acetaminophen (TYLENOL) tablet 1,000 mg  1,000 mg Oral TID Michael Boston, MD   1,000 mg at 02/08/15 2110  . alum & mag hydroxide-simeth (MAALOX/MYLANTA) 200-200-20 MG/5ML suspension 30 mL  30 mL Oral Q6H PRN Michael Boston, MD      . aspirin EC tablet 81 mg  81 mg Oral Daily Michael Boston, MD   81 mg at 02/08/15 0802  . atorvastatin (LIPITOR) tablet 40 mg  40 mg Oral q1800 Michael Boston, MD   40 mg at 02/08/15 1731  . bisacodyl (DULCOLAX) suppository 10 mg  10 mg Rectal Q12H PRN Michael Boston, MD      . busPIRone (BUSPAR) tablet 7.5 mg  7.5 mg Oral BID Michael Boston, MD   7.5 mg at 02/08/15 2110  . citalopram (CELEXA) tablet 40 mg  40 mg Oral Daily Michael Boston, MD   40 mg at 02/08/15 0802  . diphenhydrAMINE (BENADRYL) 12.5 MG/5ML elixir 12.5 mg  12.5 mg Oral Q6H PRN Michael Boston, MD   12.5 mg at 02/07/15 1759   Or  . diphenhydrAMINE (BENADRYL) injection 12.5 mg  12.5 mg Intravenous Q6H PRN Michael Boston, MD      . enoxaparin (LOVENOX) injection 40 mg  40 mg Subcutaneous Q24H Michael Boston, MD   40 mg at 02/09/15 0715  . HYDROmorphone (DILAUDID) injection 0.5-2 mg  0.5-2 mg Intravenous Q2H PRN Michael Boston, MD      . lactated ringers bolus 1,000 mL  1,000 mL Intravenous  Q8H PRN Michael Boston, MD      . lip balm (CARMEX) ointment 1 application  1 application Topical BID Michael Boston, MD   1 application at 56/38/75 2111  . lisinopril (PRINIVIL,ZESTRIL) tablet 10 mg  10 mg Oral Daily Michael Boston, MD   10 mg at 02/08/15 0802  . magic mouthwash  15 mL Oral QID PRN Michael Boston, MD      . menthol-cetylpyridinium (CEPACOL) lozenge 3 mg  1 lozenge Oral PRN Michael Boston, MD      . metoprolol  (LOPRESSOR) injection 5 mg  5 mg Intravenous Q6H PRN Michael Boston, MD      . nicotine (NICODERM CQ - dosed in mg/24 hours) patch 14 mg  14 mg Transdermal Q24H Michael Boston, MD   14 mg at 02/06/15 1714  . nicotine polacrilex (NICORETTE) gum 4 mg  4 mg Oral PRN Michael Boston, MD      . ondansetron [facility]) tablet 4 mg  4 mg Oral Q6H PRN Michael Boston, MD   4 mg at 02/08/15 1319   Or  . ondansetron [facility]) injection 4 mg  4 mg Intravenous Q6H PRN Michael Boston, MD      . oxyCODONE (Oxy IR/ROXICODONE) immediate release tablet 5-10 mg  5-10 mg Oral Q4H PRN Michael Boston, MD   10 mg at 02/09/15 0543  . phenol (CHLORASEPTIC) mouth spray 2 spray  2 spray Mouth/Throat PRN Michael Boston, MD      . pregabalin (LYRICA) capsule 75 mg  75 mg Oral QHS PRN Michael Boston, MD   75 mg at 02/08/15 2110  . saccharomyces boulardii (FLORASTOR) capsule 250 mg  250 mg Oral BID Michael Boston, MD   250 mg at 02/08/15 2110  . sodium chloride 0.9 % injection 3 mL  3 mL Intravenous Q12H Michael Boston, MD   3 mL at 02/08/15 2116  . sodium chloride 0.9 % injection 3 mL  3 mL Intravenous PRN Michael Boston, MD         Allergies  Allergen Reactions  . Wellbutrin [Bupropion] Other (See Comments)    Makes people more nervous and anger    Disposition: 01-Home or Self Care  Discharge Instructions    Call MD for:  extreme fatigue    Complete by:  As directed      Call MD for:  extreme fatigue    Complete by:  As directed      Call MD for:  hives    Complete by:  As directed      Call MD for:  hives    Complete by:  As directed      Call MD for:  persistant nausea and vomiting    Complete by:  As directed      Call MD for:  persistant nausea and vomiting    Complete by:  As directed      Call MD for:  redness, tenderness, or signs of infection (pain, swelling, redness, odor or green/yellow discharge around incision site)    Complete by:  As directed      Call MD for:  redness, tenderness, or signs of infection (pain, swelling,  redness, odor or green/yellow discharge around incision site)    Complete by:  As directed      Call MD for:  severe uncontrolled pain    Complete by:  As directed      Call MD for:  severe uncontrolled pain    Complete by:  As directed  Call MD for:    Complete by:  As directed   Temperature > 101.56F     Call MD for:    Complete by:  As directed   Temperature > 101.56F     Diet - low sodium heart healthy    Complete by:  As directed      Diet - low sodium heart healthy    Complete by:  As directed      Discharge instructions    Complete by:  As directed   Please see discharge instruction sheets.  Also refer to handout given an office.  Please call our office if you have any questions or concerns [phone]     Discharge instructions    Complete by:  As directed   Please see discharge instruction sheets.  Also refer to handout given an office.  Please call our office if you have any questions or concerns [phone]     Discharge wound care:    Complete by:  As directed   If you have closed incisions, shower and bathe over these incisions with soap and water every day.  Remove all surgical dressings on postoperative day #3.  You do not need to replace dressings over the closed incisions unless you feel more comfortable with a Band-Aid covering it.   If you have an open wound that requires packing, please see wound care instructions.  In general, remove all dressings, wash wound with soap and water and then replace with saline moistened gauze.  Do the dressing change at least every day.  Please call our office [phone] if you have further questions.     Discharge wound care:    Complete by:  As directed   If you have closed incisions, shower and bathe over these incisions with soap and water every day.  Remove all surgical dressings on postoperative day #3.  You do not need to replace dressings over the closed incisions unless you feel more comfortable with a Band-Aid  covering it.   If you have an open wound that requires packing, please see wound care instructions.  In general, remove all dressings, wash wound with soap and water and then replace with saline moistened gauze.  Do the dressing change at least every day.  Please call our office [phone] if you have further questions.     Driving Restrictions    Complete by:  As directed   No driving until off narcotics and can safely swerve away without pain during an emergency     Driving Restrictions    Complete by:  As directed   No driving until off narcotics and can safely swerve away without pain during an emergency     Increase activity slowly    Complete by:  As directed   Walk an hour a day.  Use 20-30 minute walks.  When you can walk 30 minutes without difficulty, it is fine to restart low impact/moderate activities such as biking, jogging, swimming, sexual activity, etc.  Eventually you can increase to unrestricted activity when not feeling pain.  If you feel pain: STOP!Marland Kitchen   Let pain protect you from overdoing it.  Use ice/heat & over-the-counter pain medications to help minimize soreness.  If that is not enough, then use your narcotic pain prescription as needed to remain active.  It is better to take extra pain medications and be more active than to stay bedridden to avoid all pain medications.     Increase activity slowly  Complete by:  As directed   Walk an hour a day.  Use 20-30 minute walks.  When you can walk 30 minutes without difficulty, it is fine to restart low impact/moderate activities such as biking, jogging, swimming, sexual activity, etc.  Eventually you can increase to unrestricted activity when not feeling pain.  If you feel pain: STOP!Marland Kitchen   Let pain protect you from overdoing it.  Use ice/heat & over-the-counter pain medications to help minimize soreness.  If that is not enough, then use your narcotic pain prescription as needed to remain active.  It is better to take extra pain  medications and be more active than to stay bedridden to avoid all pain medications.     Lifting restrictions    Complete by:  As directed   Avoid heavy lifting initially.  Do not push through pain.  You have no specific weight limit - if it hurts to do, DON'T DO IT.   If you feel no pain, you are not injuring anything.  Pain will protect you from injury.  Coughing and sneezing are far more stressful to your incision than any lifting.  Avoid resuming heavy lifting / intense activity until off all narcotic pain medications.  When ready to exercise more, give yourself 2 weeks to gradually get back to full intense exercise/activity.     Lifting restrictions    Complete by:  As directed   Avoid heavy lifting initially.  Do not push through pain.  You have no specific weight limit - if it hurts to do, DON'T DO IT.   If you feel no pain, you are not injuring anything.  Pain will protect you from injury.  Coughing and sneezing are far more stressful to your incision than any lifting.  Avoid resuming heavy lifting / intense activity until off all narcotic pain medications.  When ready to exercise more, give yourself 2 weeks to gradually get back to full intense exercise/activity.     May shower / Bathe    Complete by:  As directed      May shower / Bathe    Complete by:  As directed      May walk up steps    Complete by:  As directed      May walk up steps    Complete by:  As directed      Sexual Activity Restrictions    Complete by:  As directed   Sexual activity as tolerated.  Do not push through pain.  Pain will protect you from injury.     Sexual Activity Restrictions    Complete by:  As directed   Sexual activity as tolerated.  Do not push through pain.  Pain will protect you from injury.     Walk with assistance    Complete by:  As directed   Walk over an hour a day.  May use a walker/cane/companion to help with balance and stamina.     Walk with assistance    Complete by:  As directed   Walk  over an hour a day.  May use a walker/cane/companion to help with balance and stamina.            Medication List    TAKE these medications        aspirin EC 81 MG tablet  Take 81 mg by mouth daily.     atorvastatin 40 MG tablet  Commonly known as:  LIPITOR  Take 1 tablet (40 mg total) by mouth  daily at 6 PM.     busPIRone 7.5 MG tablet  Commonly known as:  BUSPAR  Take 1 tablet (7.5 mg total) by mouth 2 (two) times daily.     citalopram 40 MG tablet  Commonly known as:  CELEXA  Take 1 tablet (40 mg total) by mouth daily. Take 20 mg (half a tablet) for 1 week then increase to 40 mg daily     lisinopril 10 MG tablet  Commonly known as:  PRINIVIL,ZESTRIL  Take 1 tablet (10 mg total) by mouth daily.     nicotine 14 mg/24hr patch  Commonly known as:  NICODERM CQ - dosed in mg/24 hours  Place 1 patch (14 mg total) onto the skin daily.     nicotine polacrilex 4 MG gum  Commonly known as:  NICORETTE  Take 1 each (4 mg total) by mouth as needed for smoking cessation.     oxyCODONE 5 MG immediate release tablet  Commonly known as:  Oxy IR/ROXICODONE  Take 1-2 tablets (5-10 mg total) by mouth every 4 (four) hours as needed for moderate pain, severe pain or breakthrough pain.     pregabalin 75 MG capsule  Commonly known as:  LYRICA  Take 1 capsule (75 mg total) by mouth at bedtime as needed (for feet).           Follow-up Information    Follow up with Makell Cyr C., MD. Schedule an appointment as soon as possible for a visit in 3 weeks.   Specialty:  General Surgery   Why:  To follow up after your operation, To follow up after your hospital stay   Contact information:   [address] [place] [address] [phone]        Signed: Morton Peters, M.D., F.A.C.S. Gastrointestinal and Minimally Invasive Surgery [facility] [address] [place],  [address] [phone] Main /  Paging   02/09/2015, 7:54 AM

## 2015-02-09 NOTE — Progress Notes (Signed)
RT note: Pt. found on CPAP, placed self on just prior to my arrival, checked machine with no complications, on room air, tolerating well, RT to monitor.

## 2015-02-14 ENCOUNTER — Telehealth: Payer: Self-pay | Admitting: *Deleted

## 2015-02-14 NOTE — Telephone Encounter (Signed)
Oncology Nurse Navigator Documentation  Oncology Nurse Navigator Flowsheets 02/14/2015  Referral date to RadOnc/MedOnc 02/09/2015  Navigator Encounter Type Introductory phone call  [name] was not OOB yet. Male answering phone took message and will have him call back.

## 2015-02-14 NOTE — Telephone Encounter (Signed)
Wife returned call and accepted appointment for 03/07/15 at 2 pm with Dr. Benay Spice. Aware of valet parking and to bring insurance information and list of medications.

## 2015-03-07 ENCOUNTER — Ambulatory Visit (HOSPITAL_BASED_OUTPATIENT_CLINIC_OR_DEPARTMENT_OTHER): Payer: Self-pay | Admitting: Oncology

## 2015-03-07 ENCOUNTER — Encounter: Payer: Self-pay | Admitting: *Deleted

## 2015-03-07 ENCOUNTER — Encounter: Payer: Self-pay | Admitting: Oncology

## 2015-03-07 ENCOUNTER — Ambulatory Visit (HOSPITAL_BASED_OUTPATIENT_CLINIC_OR_DEPARTMENT_OTHER): Payer: Self-pay

## 2015-03-07 VITALS — BP 116/57 | HR 73 | Temp 97.9°F | Resp 18 | Ht 72.0 in | Wt 243.1 lb

## 2015-03-07 DIAGNOSIS — I10 Essential (primary) hypertension: Secondary | ICD-10-CM

## 2015-03-07 DIAGNOSIS — C7A8 Other malignant neuroendocrine tumors: Secondary | ICD-10-CM

## 2015-03-07 DIAGNOSIS — Z87891 Personal history of nicotine dependence: Secondary | ICD-10-CM

## 2015-03-07 NOTE — Progress Notes (Signed)
[place] Patient Consult   Referring MD: Darivs Goedeke 60 y.o.  11/23/1955    Reason for Referral: Carcinoid tumor   HPI: [name] reports a history of intermittent right lower abdominal pain for the past year. The pain was worse with lifting. He was felt to most likely have a hernia. He was referred to Dr. Johney Maine. He was taken to a diagnostic laparoscopy on 02/06/2015. No hernia was found. A mass was noted at the proximal ileum, partially obstructing with thickening of mesentery suspicious for lymphadenopathy. Remainder of the small intestine was normal. The stomach, liver, and gallbladder appeared normal. A portion of small bowel was resected and a side-to-side anastomosis was created between the distal jejunum and proximal ileum.  The pathology ([id number]) confirmed a low-grade neuroendocrine tumor of the ileum measuring 1.8 cm. The surgical margins were negative. Tumor invaded subserosal connective tissue. Lymphovascular and perineural invasion were present. One lymph node contained metastatic tumor.  He reports the abdominal pain has resolved.  Past Medical History  Diagnosis Date  . Hypertension   . Hyperlipemia   . CVA (cerebral vascular accident) [facility]) 2009,& September 03, 2014  . Hx of adenomatous colonic polyps 01/15/2015  .    .    . Depression   . Anxiety   . Cerebrovascular accident ([place]) 11/07/2014  . Left sided lacunar stroke [facility])     1st stroke in 2009 2nd stroke July 2016: Acute lacunar type infarct extending from the left corona radiata through the left external capsule Thought to be due to small vessel disease     . Pneumonia ~ 2013 & 2014 X 2  . OSA on CPAP   . Diabetes type 2, controlled ([place])     controlled by lifestyle modification  . Arthritis     "generalized; not too bad" (02/06/2015)  . Diabetic peripheral neuropathy ([place])   . History of gout   . Chronic kidney disease     abn labs; "related to Metformin  I was taking"    .   Carcinoid tumor-ileum                                                                                    December 2016  Past Surgical History  Procedure Laterality Date  . Knee arthroscopy Left X 2  . Carotid endarterectomy Left 11/2007    mptes [id number]  . Hernia repair    . Laparoscopic small bowel resection  02/06/2015  . Laparoscopic assisted ventral hernia repair  02/06/2015  . Appendectomy  1980's  . Cardiac catheterization  2016  . Laparoscopy N/A 02/06/2015    Procedure: LAPAROSCOPY DIAGNOSTIC;  Surgeon: Michael Boston, MD;  Location: [place];  Service: General;  Laterality: N/A;  . Bowel resection N/A 02/06/2015    Procedure: SMALL BOWEL RESECTION;  Surgeon: Michael Boston, MD;  Location: [place];  Service: General;  Laterality: N/A;    Medications: Reviewed  Allergies:  Allergies  Allergen Reactions  . Wellbutrin [Bupropion] Other (See Comments)    Makes people more nervous and anger    Family history: His mother had breast cancer. He has 4  sisters and one brother. One daughter. No other family history of cancer.  Social History:   He lives in [place]. 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 He quit smoking cigarettes on hospital admission December 2016. No alcohol use. No transfusion history. No risk factor for HIV or hepatitis.     ROS:   Positives include: Intentional 140 pound weight loss over the past few years, loose stool twice daily prior to surgery December 2016, chronic cough, mild right hand weakness and speech difficulty following CVAs, right abdominal pain prior to surgery-resolved  A complete ROS was otherwise negative.  Physical Exam:  Blood pressure 116/57, pulse 73, temperature 97.9 F (36.6 C), temperature source Oral, resp. rate 18, height 6' (1.829 m), weight 243 lb 1.6 oz (110.269 kg), SpO2 99 %.  HEENT: Multiple missing teeth, oropharynx without visible mass, neck without mass Lungs: Clear  bilaterally Cardiac: Regular rate and rhythm Abdomen: Healed surgical incision, no hepatosplenomegaly, no mass, nontender GU: Testes without mass  Vascular: No leg edema Lymph nodes: No cervical, supra-clavicular, axillary, or inguinal nodes Neurologic: Alert and oriented, the motor exam appears intact in the upper and lower extremities Skin: Mild erythematous rash at the upper anterior chest Musculoskeletal: No spine tenderness   LAB:   CMP      Component Value Date/Time   NA 137 02/07/2015 0455   K 4.5 02/08/2015 0544   CL 108 02/07/2015 0455   CO2 23 02/07/2015 0455   GLUCOSE 91 02/07/2015 0455   BUN 18 02/07/2015 0455   CREATININE 1.37* 02/08/2015 0544   CALCIUM 8.6* 02/07/2015 0455   PROT 7.3 09/03/2014 1047   ALBUMIN 4.1 09/03/2014 1047   AST 18 09/03/2014 1047   ALT 16* 09/03/2014 1047   ALKPHOS 90 09/03/2014 1047   BILITOT 0.8 09/03/2014 1047   GFRNONAA 56* 02/08/2015 0544   GFRAA >60 02/08/2015 0544      Assessment/Plan:   1. 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 Intermittent abdominal pain prior to surgery December 2016-resolved  3.   History tobacco abuse  4.   Hyperlipidemia  5.   Hypertension   Disposition:   [name] has been diagnosed with a low-grade neuroendocrine tumor of the ileum. I discussed the pathology report with [name] and his wife. He has a good prognosis for a long-term survival. However there is a significant chance of developing recurrent carcinoid disease in the future. I do not recommend surveillance CT scans.  We checked a baseline chromogranin A level today. He will return for an office visit and chromogranin A level in 9 months. He will contact us in the interim for new symptoms.  [place],  [place] 03/07/2015, 2:24 PM

## 2015-03-07 NOTE — Progress Notes (Signed)
Oncology Nurse Navigator Documentation  Oncology Nurse Navigator Flowsheets 03/07/2015  Navigator Location [facility]Med Onc  Navigator Encounter Type Initial MedOnc  Abnormal Finding Date 02/06/2015  Confirmed Diagnosis Date 02/06/2015  Surgery Date 02/06/2015  Patient Visit Type MedOnc;Initial  Barriers/Navigation Needs Education  Education Newly Diagnosed Cancer Education  Interventions Education Method  Education Method Verbal;Written;Teach-back  Support Groups/Services GI Support Group  Acuity Level 1  Time Spent with Patient 60  Met with patient and his wife, Butch Penny during new patient visit. Explained the role of the GI Nurse Navigator and provided New Patient Packet with information on: 1. Neuroendocrine cancer (carcinoid) and handout on chromogranin lab test 2. Support groups 3. Advanced Directives 4. Fall Safety Plan Answered questions, reviewed current treatment plan using TEACH back and provided emotional support. Provided copy of current treatment plan. He understands that recurrence is minimal and he requires no treatment at this time. Instructed him to call for unintentional weight loss, diarrhea, extreme fatigue, flushing or abdominal pain.  Merceda Elks, RN, BSN GI Oncology [place]

## 2015-03-08 LAB — CHROMOGRANIN A: CHROMOGRAN A: 3 nmol/L (ref 0–5)

## 2015-03-09 ENCOUNTER — Telehealth: Payer: Self-pay | Admitting: *Deleted

## 2015-03-09 DIAGNOSIS — C7A8 Other malignant neuroendocrine tumors: Secondary | ICD-10-CM

## 2015-03-09 NOTE — Telephone Encounter (Signed)
-----   Message from Ladell Pier, MD sent at 03/08/2015  8:33 PM EST ----- Please call patient, chromagranin is normal,  Discussed case with Dr. Johney Maine and case presented at GI conference, possible additional lymph nodes not removed at surgery, recommends a CTabd/pelvis next 1 month to establish baseline, oral contrast only

## 2015-03-09 NOTE — Telephone Encounter (Signed)
Called pt with information below, per Dr. Benay Spice. He voiced understanding. Informed him radiology schedulers will contact him for CT within a month.

## 2015-03-23 ENCOUNTER — Ambulatory Visit (HOSPITAL_COMMUNITY)
Admission: RE | Admit: 2015-03-23 | Discharge: 2015-03-23 | Disposition: A | Discharge status: Home / Self Care | Payer: Self-pay | Source: Ambulatory Visit | Attending: Oncology | Admitting: Oncology

## 2015-03-23 DIAGNOSIS — C7A8 Other malignant neuroendocrine tumors: Secondary | ICD-10-CM | POA: Insufficient documentation

## 2015-03-23 DIAGNOSIS — M4806 Spinal stenosis, lumbar region: Secondary | ICD-10-CM | POA: Insufficient documentation

## 2015-03-23 DIAGNOSIS — M47816 Spondylosis without myelopathy or radiculopathy, lumbar region: Secondary | ICD-10-CM | POA: Insufficient documentation

## 2015-03-23 DIAGNOSIS — I7090 Unspecified atherosclerosis: Secondary | ICD-10-CM | POA: Insufficient documentation

## 2015-03-23 DIAGNOSIS — M5136 Other intervertebral disc degeneration, lumbar region: Secondary | ICD-10-CM | POA: Insufficient documentation

## 2015-04-10 ENCOUNTER — Other Ambulatory Visit: Payer: Self-pay | Admitting: Internal Medicine

## 2015-04-27 ENCOUNTER — Encounter: Payer: Self-pay | Admitting: Internal Medicine

## 2015-04-27 ENCOUNTER — Ambulatory Visit (INDEPENDENT_AMBULATORY_CARE_PROVIDER_SITE_OTHER): Payer: Self-pay | Admitting: Internal Medicine

## 2015-04-27 VITALS — BP 143/63 | HR 56 | Temp 98.3°F | Ht 70.0 in | Wt 259.9 lb

## 2015-04-27 DIAGNOSIS — L219 Seborrheic dermatitis, unspecified: Secondary | ICD-10-CM

## 2015-04-27 DIAGNOSIS — I6381 Other cerebral infarction due to occlusion or stenosis of small artery: Secondary | ICD-10-CM

## 2015-04-27 DIAGNOSIS — I10 Essential (primary) hypertension: Secondary | ICD-10-CM

## 2015-04-27 DIAGNOSIS — F32A Depression, unspecified: Secondary | ICD-10-CM

## 2015-04-27 DIAGNOSIS — F329 Major depressive disorder, single episode, unspecified: Secondary | ICD-10-CM

## 2015-04-27 DIAGNOSIS — N183 Chronic kidney disease, stage 3 unspecified: Secondary | ICD-10-CM

## 2015-04-27 DIAGNOSIS — E785 Hyperlipidemia, unspecified: Secondary | ICD-10-CM

## 2015-04-27 DIAGNOSIS — E1122 Type 2 diabetes mellitus with diabetic chronic kidney disease: Secondary | ICD-10-CM

## 2015-04-27 DIAGNOSIS — F17200 Nicotine dependence, unspecified, uncomplicated: Secondary | ICD-10-CM

## 2015-04-27 DIAGNOSIS — Z79899 Other long term (current) drug therapy: Secondary | ICD-10-CM

## 2015-04-27 DIAGNOSIS — Z87891 Personal history of nicotine dependence: Secondary | ICD-10-CM

## 2015-04-27 MED ORDER — CITALOPRAM HYDROBROMIDE 40 MG PO TABS: 40.0000 mg | ORAL_TABLET | Freq: Every day | ORAL | Status: DC

## 2015-04-27 MED ORDER — ASPIRIN EC 81 MG PO TBEC: 81.0000 mg | DELAYED_RELEASE_TABLET | Freq: Every day | ORAL | Status: AC

## 2015-04-27 MED ORDER — ATORVASTATIN CALCIUM 40 MG PO TABS: 40.0000 mg | ORAL_TABLET | Freq: Every day | ORAL | Status: DC

## 2015-04-27 MED ORDER — BUSPIRONE HCL 7.5 MG PO TABS: 7.5000 mg | ORAL_TABLET | Freq: Two times a day (BID) | ORAL | Status: DC

## 2015-04-27 MED ORDER — KETOCONAZOLE 2 % EX SHAM: 1.0000 "application " | MEDICATED_SHAMPOO | CUTANEOUS | Status: DC

## 2015-04-27 MED ORDER — GABAPENTIN 100 MG PO CAPS: 100.0000 mg | ORAL_CAPSULE | Freq: Three times a day (TID) | ORAL | Status: DC | PRN

## 2015-04-27 MED ORDER — LISINOPRIL 20 MG PO TABS: 20.0000 mg | ORAL_TABLET | Freq: Every day | ORAL | Status: DC

## 2015-04-27 NOTE — Assessment & Plan Note (Signed)
I refilled his atorvastatin 40 mg daily today. He denied any myalgias or side effects.

## 2015-04-27 NOTE — Progress Notes (Signed)
Patient ID: [name], male   DOB: 1955/10/05, 60 y.o.   MRN: [id number] [facility] Subjective:   Patient ID: [name] male   DOB: July 26, 1955 60 y.o.   MRN: [id number]  HPI: [name] is a 60 y.o. male with neuroendocrine tumor stage IIIB status-post partial small bowel resection in December 2016, history of left lacunar thrombotic stroke, hypertension, type 2 diabetes, tobacco abuse, obstructive sleep apnea, depression and anxiety, presents to clinic for follow up of hypertension, tobacco abuse, depression, and seborrheic dermatitis.  Hypertension: He has not been checking his pressures at home, but is compliant with lisinopril 10 mg daily. He denies any side effects such as cough or lightheadedness.  Tobacco abuse: He quit smoking since his surgery in December after he made a deal with his Psychologist, sport and exercise.  Depression: He feels his depression and anxiety attacks have improved significant since starting buspirone last visit.  Seborrheic dermatitis: He has a flaky red rash on his eyebrows and around his nose that is bothersome and he would like something to treat it.  I have reviewed his medications with him today.  Review of Systems  Constitutional: Negative for fever, chills, weight loss and malaise/fatigue.  Respiratory: Negative for shortness of breath.   Cardiovascular: Negative for chest pain and leg swelling.  Neurological: Negative for headaches.  Psychiatric/Behavioral: Negative for depression. The patient is not nervous/anxious.     Objective:  Physical Exam: Filed Vitals:   04/27/15 1341  BP: 143/63  Pulse: 56  Temp: 98.3 F (36.8 C)  TempSrc: Oral  Height: 5\' 10"  (1.778 m)  Weight: 259 lb 14.4 oz (117.89 kg)  SpO2: 100%   General: resting in chair comfortably, appropriately conversational Cardiac: regular rate and rhythm, no rubs, murmurs or gallops Pulm: breathing well, clear to auscultation bilaterally Abd: bowel  sounds normal, soft, nondistended, non-tender Ext: warm and well perfused, without pedal edema Lymph: no cervical or supraclavicular lymphadenopathy Skin: greasy read scaly plaques on eyebrows and nasolabial folds Neuro: alert and oriented X3, cranial nerves II-XII grossly intact, moving all extremities well  Assessment & Plan:  Case discussed with Dr. Evette Doffing  Essential hypertension His blood pressure today was 143/63 on lisinopril 10 mg daily. I would like to aggressively control his blood pressure given his history of stroke, so I've increased his lisinopril from 10-20 mg daily.  Seborrheic dermatitis I sent in Ketoconazole shampooe to apply to his face twice a week.  Hyperlipidemia I refilled his atorvastatin 40 mg daily today. He denied any myalgias or side effects.  Tobacco use disorder He made a deal with his surgeon to quit smoking after his exploratory laparotomy back in December of this year. He held she was a deal and has quit smoking since then.  Depression He feels his depression is very well controlled on citalopram 40 mg daily, and he has stopped having panic attacks since starting buspirone 7.5 mg twice daily. I told him we have room to go up on the buspirone of his anxiety worsens, he would like to stay at the current dose for now. At the next visit, I think a PHQ9 and GAD 7 would be helpful to track his progress.   Medications Ordered Meds ordered this encounter  Medications  . lisinopril (PRINIVIL,ZESTRIL) 20 MG tablet    Sig: Take 1 tablet (20 mg total) by mouth daily.    Dispense:  90 tablet    Refill:  3  . gabapentin (NEURONTIN) 100 MG capsule  Sig: Take 1 capsule (100 mg total) by mouth 3 (three) times daily as needed.    Dispense:  30 capsule    Refill:  2  . busPIRone (BUSPAR) 7.5 MG tablet    Sig: Take 1 tablet (7.5 mg total) by mouth 2 (two) times daily.    Dispense:  60 tablet    Refill:  2  . atorvastatin (LIPITOR) 40 MG tablet    Sig: Take 1  tablet (40 mg total) by mouth daily at 6 PM.    Dispense:  90 tablet    Refill:  3  . aspirin EC 81 MG tablet    Sig: Take 1 tablet (81 mg total) by mouth daily.    Dispense:  90 tablet    Refill:  3  . citalopram (CELEXA) 40 MG tablet    Sig: Take 1 tablet (40 mg total) by mouth daily.    Dispense:  90 tablet    Refill:  2  . ketoconazole (NIZORAL) 2 % shampoo    Sig: Apply 1 application topically 2 (two) times a week.    Dispense:  120 mL    Refill:  0   Follow Up: Return in about 3 months (around 07/28/2015).

## 2015-04-27 NOTE — Assessment & Plan Note (Signed)
His blood pressure today was 143/63 on lisinopril 10 mg daily. I would like to aggressively control his blood pressure given his history of stroke, so I've increased his lisinopril from 10-20 mg daily.

## 2015-04-27 NOTE — Patient Instructions (Signed)
[name],  It was great to meet you today.  Today, your blood pressure was just a touch high, so we've gone up on the lisinopril from 10-20 mg daily. I like to see you back in 3 months to check your blood pressure again.  Quitting smoking was hands down the best thing you can do for your health. Has outstanding news-keep up the good work!  Take care, and we'll see you in 3 months.  Don't hesitate to give [place] a call if anything comes up before then.  Dr. Melburn Hake

## 2015-04-27 NOTE — Assessment & Plan Note (Addendum)
I sent in Ketoconazole shampooe to apply to his face twice a week.

## 2015-04-27 NOTE — Assessment & Plan Note (Signed)
He feels his depression is very well controlled on citalopram 40 mg daily, and he has stopped having panic attacks since starting buspirone 7.5 mg twice daily. I told him we have room to go up on the buspirone of his anxiety worsens, he would like to stay at the current dose for now. At the next visit, I think a PHQ9 and GAD 7 would be helpful to track his progress.

## 2015-04-27 NOTE — Assessment & Plan Note (Signed)
He made a deal with his surgeon to quit smoking after his exploratory laparotomy back in December of this year. He held she was a deal and has quit smoking since then.

## 2015-04-28 ENCOUNTER — Telehealth: Payer: Self-pay | Admitting: *Deleted

## 2015-04-28 NOTE — Telephone Encounter (Signed)
Call from pt's wife - states pt has been taking Gabapentin 600 mg 3 times a day. The new rx written yesterday was for 100 mg TID as needed qty# 30. Wants to know if it can be changed as previously ?

## 2015-04-28 NOTE — Addendum Note (Signed)
Addended by: Lalla Brothers T on: 04/28/2015 04:33 PM   Modules accepted: Level of Service

## 2015-04-28 NOTE — Progress Notes (Signed)
Internal Medicine Clinic Attending  Case discussed with Dr. Flores at the time of the visit.  We reviewed the resident's history and exam and pertinent patient test results.  I agree with the assessment, diagnosis, and plan of care documented in the resident's note. 

## 2015-05-02 ENCOUNTER — Other Ambulatory Visit: Payer: Self-pay | Admitting: Internal Medicine

## 2015-05-02 DIAGNOSIS — N183 Chronic kidney disease, stage 3 unspecified: Secondary | ICD-10-CM

## 2015-05-02 DIAGNOSIS — E1159 Type 2 diabetes mellitus with other circulatory complications: Secondary | ICD-10-CM

## 2015-05-02 DIAGNOSIS — E1122 Type 2 diabetes mellitus with diabetic chronic kidney disease: Secondary | ICD-10-CM

## 2015-05-02 MED ORDER — GABAPENTIN 100 MG PO CAPS: 100.0000 mg | ORAL_CAPSULE | Freq: Three times a day (TID) | ORAL | Status: DC | PRN

## 2015-05-04 ENCOUNTER — Telehealth: Payer: Self-pay | Admitting: Internal Medicine

## 2015-05-04 NOTE — Telephone Encounter (Signed)
Pt wife requesting the nurse to call back regarding gabapentin.

## 2015-05-08 NOTE — Telephone Encounter (Signed)
Have called twice, no answer 

## 2015-05-09 NOTE — Telephone Encounter (Signed)
Pt's wife answered this time, clarified script that was sent 3/14, she states she understands

## 2015-07-04 ENCOUNTER — Other Ambulatory Visit: Payer: Self-pay

## 2015-07-04 DIAGNOSIS — E1122 Type 2 diabetes mellitus with diabetic chronic kidney disease: Secondary | ICD-10-CM

## 2015-07-04 DIAGNOSIS — N183 Chronic kidney disease, stage 3 unspecified: Secondary | ICD-10-CM

## 2015-07-04 NOTE — Telephone Encounter (Signed)
Pt requesting Lyrica to be filled.

## 2015-07-04 NOTE — Telephone Encounter (Signed)
Patient does not have a prescription for Lyrica.

## 2015-07-04 NOTE — Telephone Encounter (Signed)
Pt needs appt, sent to [facility] also

## 2015-07-04 NOTE — Telephone Encounter (Signed)
Called, no answer.

## 2015-07-06 ENCOUNTER — Other Ambulatory Visit: Payer: Self-pay | Admitting: *Deleted

## 2015-07-06 NOTE — Telephone Encounter (Signed)
Pt spouse calls and states he needs lyrica put back on his med list. She states that dr Melburn Hake had taken him off of lyrica and started him on neurotin, she states the neurotin is not helping at all pt would like to go back to lyrica, does not want to see any doctors, wife states she is unhappy with pt's care "seeing all those interns that dont know what to do or when to do, we want a real doctor" i explained that i was sorry but residents saw the majority of the patients in clinic appt 5/25

## 2015-07-06 NOTE — Telephone Encounter (Signed)
Patient was called to schedule an appt, but no answer.  Left message asking to call me back.  Less than five minutes Tiffany transfers call to me, because wife calls me right back.  States he was just seen two months ago why does he need to come back.  Transferred call to [place].

## 2015-07-13 ENCOUNTER — Ambulatory Visit (INDEPENDENT_AMBULATORY_CARE_PROVIDER_SITE_OTHER): Payer: BLUE CROSS/BLUE SHIELD | Admitting: Internal Medicine

## 2015-07-13 ENCOUNTER — Encounter: Payer: Self-pay | Admitting: Internal Medicine

## 2015-07-13 VITALS — BP 152/81 | Temp 98.0°F | Ht 72.0 in | Wt 271.3 lb

## 2015-07-13 DIAGNOSIS — I10 Essential (primary) hypertension: Secondary | ICD-10-CM | POA: Diagnosis not present

## 2015-07-13 DIAGNOSIS — E1140 Type 2 diabetes mellitus with diabetic neuropathy, unspecified: Secondary | ICD-10-CM | POA: Diagnosis not present

## 2015-07-13 MED ORDER — PREGABALIN 75 MG PO CAPS: 75.0000 mg | ORAL_CAPSULE | Freq: Two times a day (BID) | ORAL | Status: DC

## 2015-07-13 MED ORDER — LISINOPRIL 40 MG PO TABS: 40.0000 mg | ORAL_TABLET | Freq: Every day | ORAL | Status: DC

## 2015-07-13 NOTE — Progress Notes (Signed)
Patient ID: [name], male   DOB: [birth year], 58 y.o.   MRN: [id number]     Subjective:   Patient ID: [name] male    DOB: [birth year] 60 y.o.    MRN: [id number] Health Maintenance Due: There are no preventive care reminders to display for this patient.  _________________________________________________  HPI: [name] is a 60 y.o. male here for a refill of lyrica.  Pt has a PMH outlined below.  Please see problem-based charting assessment and plan for further status of patient's chronic medical problems addressed at today's visit.  PMH: Past Medical History  Diagnosis Date  . Hypertension   . Hyperlipemia   . CVA (cerebral vascular accident) [facility]) 2009,& September 03, 2014  . Hx of adenomatous colonic polyps 01/15/2015  . Cough   . Shortness of breath dyspnea   . Depression   . Anxiety   . Cerebrovascular accident ([place]) 11/07/2014  . Left sided lacunar stroke [facility])     1st stroke in 2009 2nd stroke July 2016: Acute lacunar type infarct extending from the left corona radiata through the left external capsule Thought to be due to small vessel disease     . Pneumonia ~ 2013 & 2014 X 2  . OSA on CPAP   . Diabetes type 2, controlled ([place])     controlled by lifestyle modification  . Arthritis     "generalized; not too bad" (02/06/2015)  . Diabetic peripheral neuropathy ([place])   . History of gout   . Chronic kidney disease     abn labs; "related to Metformin I was taking"    Medications: Current Outpatient Prescriptions on File Prior to Visit  Medication Sig Dispense Refill  . aspirin EC 81 MG tablet Take 1 tablet (81 mg total) by mouth daily. 90 tablet 3  . atorvastatin (LIPITOR) 40 MG tablet Take 1 tablet (40 mg total) by mouth daily at 6 PM. 90 tablet 3  . busPIRone (BUSPAR) 7.5 MG tablet Take 1 tablet (7.5 mg total) by mouth 2 (two) times daily. 60 tablet 2  . citalopram (CELEXA) 40 MG tablet Take 1 tablet (40 mg total) by mouth daily. 90 tablet  2  . gabapentin (NEURONTIN) 100 MG capsule Take 1-3 capsules (100-300 mg total) by mouth 3 (three) times daily as needed. 120 capsule 2  . ketoconazole (NIZORAL) 2 % shampoo Apply 1 application topically 2 (two) times a week. 120 mL 0  . lisinopril (PRINIVIL,ZESTRIL) 20 MG tablet Take 1 tablet (20 mg total) by mouth daily. 90 tablet 3   No current facility-administered medications on file prior to visit.    Allergies: Allergies  Allergen Reactions  . Wellbutrin [Bupropion] Other (See Comments)    Makes people more nervous and anger    FH: Family History  Problem Relation Age of Onset  . Stroke Father   . Hypertension Father   . Diabetes Father   . Heart failure Father   . Colon polyps Father   . Hypertension Mother   . Diabetes Mother   . Heart failure Mother   . Colon polyps Mother   . Hypertension Sister   . Hypertension Brother   . Diabetes Sister   . Diabetes Brother   . Colon cancer Neg Hx     SH: Social History   Social History  . Marital Status: Married    Spouse Name: N/A  . Number of Children: 1  . Years of Education: 12   Occupational History  .  N/A    Social History Main Topics  . Smoking status: Former Smoker -- 1.00 packs/day for 30 years    Types: Cigarettes    Quit date: 02/02/2015  . Smokeless tobacco: Never Used  . Alcohol Use: No  . Drug Use: No  . Sexual Activity: No   Other Topics Concern  . 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  Eyes: Negative for blurred vision.  Respiratory: Negative for cough and shortness of breath.  Cardiovascular: Negative for chest pain.  Gastrointestinal: Negative for nausea, vomiting. Neurological: Negative for  dizziness.   Objective:   Vital Signs: Filed Vitals:   07/13/15 1042 07/13/15 1043  BP:  152/81  Temp: 98 F (36.7 C)   TempSrc: Oral   Height: 6' (1.829 m)   Weight: 271 lb 4.8 oz (123.061 kg)   SpO2:  99%      BP Readings from Last 3 Encounters:  07/13/15 152/81  04/27/15 143/63  03/07/15 116/57    Physical Exam: Constitutional: Vital signs reviewed.  Patient is in NAD and cooperative with exam.  Head: Normocephalic and atraumatic. Eyes: EOMI, conjunctivae nl, no scleral icterus.  Neck: Supple. Cardiovascular: RRR, no MRG. Pulmonary/Chest: Normal effort, CTA.  Neurological: A&O x3, cranial nerves II-XII are grossly intact, moving all extremities. Extremities: No LE edema.  Skin: Warm, dry and intact. No rash.   Assessment & Plan:   Assessment and plan was discussed and formulated with my attending.

## 2015-07-13 NOTE — Assessment & Plan Note (Addendum)
Pt was started on gabapentin due to insurance not paying for lyrica.  Now his insurance will pay for lyrica and would like to go back on this since gabapentin is not helping. -d/c gabapentin -restart lyrica bid

## 2015-07-13 NOTE — Assessment & Plan Note (Addendum)
BP today 152/81, HR 63.  Reports taking lisinopril 20mg  daily.  Reports compliance with meds but on rare occasion does miss doses.  Also consumes excess sodium such as eating a lot of fast food.  We discussed weight loss also and decreasing processed foods.  -DASH diet -lifestyle modifications -will increase lisinopril to 40mg   -f/u in 2 wk to recheck BP and repeat BMP

## 2015-07-13 NOTE — Patient Instructions (Signed)
Thank you for your visit today.   Please return to the internal medicine clinic in about 2 weeks or sooner if needed to recheck blood pressure.    I have made the following additions/changes to your medications:  I have increased the lisinopril to 40mg  daily.  You may take 2 tablets of the 20mg  to equal 40mg  until you run out.   Weight loss will also help with managing your blood pressure as will decreasing salt to less than 2000mg  per day.   Please be sure to bring all of your medications with you to every visit; this includes herbal supplements, vitamins, eye drops, and any over-the-counter medications.   Should you have any questions regarding your medications and/or any new or worsening symptoms, please be sure to call the clinic at [phone].   If you believe that you are suffering from a life threatening condition or one that may result in the loss of limb or function, then you should call 911 and proceed to the nearest Emergency Department.   DASH Eating Plan DASH stands for "Dietary Approaches to Stop Hypertension." The DASH eating plan is a healthy eating plan that has been shown to reduce high blood pressure (hypertension). Additional health benefits may include reducing the risk of type 2 diabetes mellitus, heart disease, and stroke. The DASH eating plan may also help with weight loss. WHAT DO I NEED TO KNOW ABOUT THE DASH EATING PLAN? For the DASH eating plan, you will follow these general guidelines:  Choose foods with a percent daily value for sodium of less than 5% (as listed on the food label).  Use salt-free seasonings or herbs instead of table salt or sea salt.  Check with your health care provider or pharmacist before using salt substitutes.  Eat lower-sodium products, often labeled as "lower sodium" or "no salt added."  Eat fresh foods.  Eat more vegetables, fruits, and low-fat dairy products.  Choose whole grains. Look for the word "whole" as the first word in  the ingredient list.  Choose fish and skinless chicken or [place] more often than red meat. Limit fish, poultry, and meat to 6 oz (170 g) each day.  Limit sweets, desserts, sugars, and sugary drinks.  Choose heart-healthy fats.  Limit cheese to 1 oz (28 g) per day.  Eat more home-cooked food and less restaurant, buffet, and fast food.  Limit fried foods.  Cook foods using methods other than frying.  Limit canned vegetables. If you do use them, rinse them well to decrease the sodium.  When eating at a restaurant, ask that your food be prepared with less salt, or no salt if possible. WHAT FOODS CAN I EAT? Seek help from a dietitian for individual calorie needs. Grains Whole grain or whole wheat bread. Brown rice. Whole grain or whole wheat pasta. Quinoa, bulgur, and whole grain cereals. Low-sodium cereals. Corn or whole wheat flour tortillas. Whole grain cornbread. Whole grain crackers. Low-sodium crackers. Vegetables Fresh or frozen vegetables (raw, steamed, roasted, or grilled). Low-sodium or reduced-sodium tomato and vegetable juices. Low-sodium or reduced-sodium tomato sauce and paste. Low-sodium or reduced-sodium canned vegetables.  Fruits All fresh, canned (in natural juice), or frozen fruits. Meat and Other Protein Products Ground beef (85% or leaner), grass-fed beef, or beef trimmed of fat. Skinless chicken or [place]. Ground chicken or [place]. Pork trimmed of fat. All fish and seafood. Eggs. Dried beans, peas, or lentils. Unsalted nuts and seeds. Unsalted canned beans. Dairy Low-fat dairy products, such as skim or 1%  milk, 2% or reduced-fat cheeses, low-fat ricotta or cottage cheese, or plain low-fat yogurt. Low-sodium or reduced-sodium cheeses. Fats and Oils Tub margarines without trans fats. Light or reduced-fat mayonnaise and salad dressings (reduced sodium). Avocado. Safflower, olive, or canola oils. Natural peanut or almond butter. Other Unsalted popcorn and pretzels. The  items listed above may not be a complete list of recommended foods or beverages. Contact your dietitian for more options. WHAT FOODS ARE NOT RECOMMENDED? Grains White bread. White pasta. White rice. Refined cornbread. Bagels and croissants. Crackers that contain trans fat. Vegetables Creamed or fried vegetables. Vegetables in a cheese sauce. Regular canned vegetables. Regular canned tomato sauce and paste. Regular tomato and vegetable juices. Fruits Dried fruits. Canned fruit in light or heavy syrup. Fruit juice. Meat and Other Protein Products Fatty cuts of meat. Ribs, chicken wings, bacon, sausage, bologna, salami, chitterlings, fatback, hot dogs, bratwurst, and packaged luncheon meats. Salted nuts and seeds. Canned beans with salt. Dairy Whole or 2% milk, cream, half-and-half, and cream cheese. Whole-fat or sweetened yogurt. Full-fat cheeses or blue cheese. Nondairy creamers and whipped toppings. Processed cheese, cheese spreads, or cheese curds. Condiments Onion and garlic salt, seasoned salt, table salt, and sea salt. Canned and packaged gravies. Worcestershire sauce. Tartar sauce. Barbecue sauce. Teriyaki sauce. Soy sauce, including reduced sodium. Steak sauce. Fish sauce. Oyster sauce. Cocktail sauce. Horseradish. Ketchup and mustard. Meat flavorings and tenderizers. Bouillon cubes. Hot sauce. Tabasco sauce. Marinades. Taco seasonings. Relishes. Fats and Oils Butter, stick margarine, lard, shortening, ghee, and bacon fat. Coconut, palm kernel, or palm oils. Regular salad dressings. Other Pickles and olives. Salted popcorn and pretzels. The items listed above may not be a complete list of foods and beverages to avoid. Contact your dietitian for more information. WHERE CAN I FIND MORE INFORMATION? National Heart, Lung, and Blood Institute: travelstabloid.com   This information is not intended to replace advice given to you by your health care provider. Make  sure you discuss any questions you have with your health care provider.   Document Released: 01/24/2011 Document Revised: 02/25/2014 Document Reviewed: 12/09/2012 Elsevier Interactive Patient Education [employer].

## 2015-07-13 NOTE — Progress Notes (Signed)
Internal Medicine Clinic Attending  Case discussed with Dr. Gill soon after the resident saw the patient.  We reviewed the resident's history and exam and pertinent patient test results.  I agree with the assessment, diagnosis, and plan of care documented in the resident's note.  

## 2015-07-14 ENCOUNTER — Other Ambulatory Visit: Payer: Self-pay

## 2015-07-14 ENCOUNTER — Other Ambulatory Visit: Payer: Self-pay | Admitting: *Deleted

## 2015-07-14 NOTE — Telephone Encounter (Signed)
Called Lyrica into [facility]. Informed patient's wife.

## 2015-07-14 NOTE — Telephone Encounter (Signed)
Pt requesting Lyrica to be filled @ [facility] on [address].

## 2015-07-18 DIAGNOSIS — L03116 Cellulitis of left lower limb: Secondary | ICD-10-CM | POA: Diagnosis not present

## 2015-07-18 DIAGNOSIS — L0390 Cellulitis, unspecified: Secondary | ICD-10-CM | POA: Diagnosis not present

## 2015-07-31 ENCOUNTER — Ambulatory Visit: Payer: No Typology Code available for payment source | Admitting: Neurology

## 2015-08-01 ENCOUNTER — Encounter: Payer: Self-pay | Admitting: Neurology

## 2015-09-28 NOTE — Telephone Encounter (Signed)
review 

## 2015-11-08 ENCOUNTER — Encounter: Payer: Self-pay | Admitting: Dietician

## 2015-11-08 ENCOUNTER — Ambulatory Visit (INDEPENDENT_AMBULATORY_CARE_PROVIDER_SITE_OTHER): Payer: BLUE CROSS/BLUE SHIELD | Admitting: Internal Medicine

## 2015-11-08 ENCOUNTER — Encounter: Payer: BLUE CROSS/BLUE SHIELD | Admitting: Dietician

## 2015-11-08 VITALS — BP 87/56 | HR 95 | Temp 98.4°F | Resp 20 | Wt 266.3 lb

## 2015-11-08 DIAGNOSIS — Z79899 Other long term (current) drug therapy: Secondary | ICD-10-CM

## 2015-11-08 DIAGNOSIS — I10 Essential (primary) hypertension: Secondary | ICD-10-CM

## 2015-11-08 DIAGNOSIS — F17211 Nicotine dependence, cigarettes, in remission: Secondary | ICD-10-CM | POA: Diagnosis not present

## 2015-11-08 DIAGNOSIS — E1140 Type 2 diabetes mellitus with diabetic neuropathy, unspecified: Secondary | ICD-10-CM | POA: Diagnosis not present

## 2015-11-08 DIAGNOSIS — F17200 Nicotine dependence, unspecified, uncomplicated: Secondary | ICD-10-CM

## 2015-11-08 DIAGNOSIS — N183 Chronic kidney disease, stage 3 unspecified: Secondary | ICD-10-CM

## 2015-11-08 DIAGNOSIS — Z23 Encounter for immunization: Secondary | ICD-10-CM

## 2015-11-08 DIAGNOSIS — E1122 Type 2 diabetes mellitus with diabetic chronic kidney disease: Secondary | ICD-10-CM

## 2015-11-08 DIAGNOSIS — F329 Major depressive disorder, single episode, unspecified: Secondary | ICD-10-CM | POA: Diagnosis not present

## 2015-11-08 DIAGNOSIS — F32A Depression, unspecified: Secondary | ICD-10-CM

## 2015-11-08 MED ORDER — PREGABALIN 75 MG PO CAPS: 75.0000 mg | ORAL_CAPSULE | Freq: Two times a day (BID) | ORAL | 3 refills | Status: DC

## 2015-11-08 MED ORDER — CITALOPRAM HYDROBROMIDE 40 MG PO TABS: 40.0000 mg | ORAL_TABLET | Freq: Every day | ORAL | 2 refills | Status: DC

## 2015-11-08 MED ORDER — BUSPIRONE HCL 7.5 MG PO TABS: 7.5000 mg | ORAL_TABLET | Freq: Two times a day (BID) | ORAL | 2 refills | Status: DC

## 2015-11-08 MED ORDER — LISINOPRIL 30 MG PO TABS: 30.0000 mg | ORAL_TABLET | Freq: Every day | ORAL | 1 refills | Status: DC

## 2015-11-08 MED ORDER — ATORVASTATIN CALCIUM 40 MG PO TABS: 40.0000 mg | ORAL_TABLET | Freq: Every day | ORAL | 3 refills | Status: DC

## 2015-11-08 NOTE — Patient Instructions (Signed)
I am going to decrease the dose of your lisinopril to 30 mg daily.  I would like to see you back in 1 month for re-check.

## 2015-11-08 NOTE — Progress Notes (Signed)
   CC: HTN  HPI:  [name] is a 60 y.o. male with a past medical history listed below here today for follow up of his HTN and DM.Marland Kitchen   For details of today's visit and the status of his chronic medical issues please refer to the assessment and plan.   Past Medical History:  Diagnosis Date  . Anxiety   . Arthritis    "generalized; not too bad" (02/06/2015)  . Cerebrovascular accident ([place]) 11/07/2014  . Chronic kidney disease    abn labs; "related to Metformin I was taking"  . Cough   . CVA (cerebral vascular accident) [facility]) 2009,& September 03, 2014  . Depression   . Diabetes type 2, controlled ([place]) 2004   controlled by lifestyle modification  . Diabetic peripheral neuropathy ([place])   . History of gout   . Hx of adenomatous colonic polyps 01/15/2015  . Hyperlipemia   . Hypertension   . Left sided lacunar stroke [facility])    1st stroke in 2009 2nd stroke July 2016: Acute lacunar type infarct extending from the left corona radiata through the left external capsule Thought to be due to small vessel disease     . OSA on CPAP   . Pneumonia ~ 2013 & 2014 X 2  . Shortness of breath dyspnea     Review of Systems:   Review of Systems  Constitutional: Negative for chills and fever.  Eyes: Negative for blurred vision and double vision.  Neurological: Negative for dizziness, focal weakness and headaches.  Psychiatric/Behavioral: Positive for depression.     Physical Exam:  Vitals:   11/08/15 1438  BP: (!) 87/56  Pulse: 95  Resp: 20  Temp: 98.4 F (36.9 C)  TempSrc: Oral  SpO2: 99%  Weight: 266 lb 4.8 oz (120.8 kg)   GENERAL- alert, co-operative, appears as stated age, not in any distress. HEENT- Atraumatic, normocephalic, PERRL, EOMI, oral mucosa appears moist CARDIAC- RRR, no murmurs, rubs or gallops. RESP- Moving equal volumes of air, and clear to auscultation bilaterally, no wheezes or crackles. ABDOMEN- Soft, nontender, bowel sounds present. NEURO- No obvious  Cr N abnormality. EXTREMITIES- pulse 2+, symmetric, no pedal edema. SKIN- Warm, dry, No rash or lesion. PSYCH- Depressed mood and affect, appropriate thought content and speech.  Assessment & Plan:   See Encounters Tab for problem based charting.  Patient discussed with Dr. Lynnae January

## 2015-11-10 ENCOUNTER — Encounter: Payer: Self-pay | Admitting: Internal Medicine

## 2015-11-10 NOTE — Assessment & Plan Note (Addendum)
PHQ 2 is 0 GAD 7 is 12, very difficult  Currently on citalopram 40 mg daily and buspirone 7.5 mg bid. Denies any panic attacks. Reports no depression. Anxiety has worsened with various life stressors at home.  Class D interaction with citalopram and buspirone. Discussed potentially changing to Duloxetine for better control of anxiety. Patient declined change since his depression is controlled. Will continue to monitor. Avoid benzos.

## 2015-11-10 NOTE — Assessment & Plan Note (Signed)
Reports quit smoking since December 2016

## 2015-11-10 NOTE — Assessment & Plan Note (Signed)
BP Readings from Last 3 Encounters:  11/08/15 (!) 87/56  07/13/15 (!) 152/81  04/27/15 (!) 143/63    Lab Results  Component Value Date   NA 137 02/07/2015   K 4.5 02/08/2015   CREATININE 1.37 (H) 02/08/2015    Assessment: Patient's BP was 102/61 on arrival. Dropped to 87/56 on standing with pulse increase from 73 to 95. Patient is asymptomatic. Denies being lightheaded/dizzy. Reports poor/no PO intake today. BP improved to 110/68 on recheck with negative orthostatics. He was increased from lisinopril 20 mg to 40 mg at visit in May. Does not check BP at home.   Plan: Decrease lisinopril 40 to 30 mg  -check BMET at next visit -f/u in 1 month for recheck

## 2015-11-10 NOTE — Assessment & Plan Note (Signed)
A1c 5.4 today. Diet controlled.   Patient reports improvement in his neuropathy since re-starting lyrica.   Continue lyrica

## 2015-11-14 NOTE — Progress Notes (Signed)
Internal Medicine Clinic Attending  Case discussed with Dr. Boswell at the time of the visit.  We reviewed the resident's history and exam and pertinent patient test results.  I agree with the assessment, diagnosis, and plan of care documented in the resident's note.  

## 2015-12-05 ENCOUNTER — Ambulatory Visit (HOSPITAL_BASED_OUTPATIENT_CLINIC_OR_DEPARTMENT_OTHER): Payer: BLUE CROSS/BLUE SHIELD | Admitting: Oncology

## 2015-12-05 ENCOUNTER — Telehealth: Payer: Self-pay | Admitting: Oncology

## 2015-12-05 ENCOUNTER — Other Ambulatory Visit: Payer: BLUE CROSS/BLUE SHIELD

## 2015-12-05 VITALS — BP 140/65 | HR 77 | Temp 99.1°F | Resp 18 | Ht 72.0 in | Wt 270.4 lb

## 2015-12-05 DIAGNOSIS — C7A8 Other malignant neuroendocrine tumors: Secondary | ICD-10-CM | POA: Diagnosis not present

## 2015-12-05 DIAGNOSIS — I10 Essential (primary) hypertension: Secondary | ICD-10-CM

## 2015-12-05 DIAGNOSIS — C7B8 Other secondary neuroendocrine tumors: Secondary | ICD-10-CM | POA: Diagnosis not present

## 2015-12-05 NOTE — Telephone Encounter (Signed)
Gave patient avs report and appointments for July.  °

## 2015-12-05 NOTE — Progress Notes (Signed)
  [facility] OFFICE PROGRESS NOTE   Diagnosis: Carcinoid tumor  INTERVAL HISTORY:   He returns as scheduled. He feels well. Good appetite. No diarrhea or flushing. No abdominal pain.  Objective:  Vital signs in last 24 hours:  Blood pressure 140/65, pulse 77, temperature 99.1 F (37.3 C), temperature source Oral, resp. rate 18, height 6' (1.829 m), weight 270 lb 6.4 oz (122.7 kg), SpO2 97 %.    HEENT: Neck without mass Lymphatics: No cervical, supraclavicular, axillary, or inguinal nodes Resp: Lungs clear bilaterally Cardio: Regular rate and rhythm GI: No hepatomegaly, no mass, nontender Vascular: No leg edema   Lab Results:  Chromogranin A on 03/07/2015: 3  Medications: I have reviewed the patient's current medications.  Assessment/Plan: 1. 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 1 mesenteric nodule contained metastatic low-grade neuroendocrine tumor, presumably a replaced lymph node  2. Intermittent abdominal pain prior to surgery December 2016-resolved  3.   History tobacco abuse  4.   Hyperlipidemia  5.   Hypertension    Disposition:  Mr. Lassila is in clinical remission from the carcinoid tumor. We will follow-up on the chromogranin A level from today. He will return for an office visit in 9 months. He will contact us in the interim for new symptoms.  Betsy Coder, MD  12/05/2015  3:14 PM

## 2015-12-07 ENCOUNTER — Telehealth: Payer: Self-pay | Admitting: *Deleted

## 2015-12-07 LAB — CHROMOGRANIN A: CHROMOGRAN A: 4 nmol/L (ref 0–5)

## 2015-12-07 NOTE — Telephone Encounter (Signed)
-----   Message from Ladell Pier, MD sent at 12/07/2015  4:53 PM EDT ----- Please call patient, chromogranin A is normal

## 2015-12-07 NOTE — Telephone Encounter (Signed)
Per Dr. Benay Spice, patient notified that chromogranin A is normal.  Patient appreciative of call and has no questions or concerns at this time.

## 2015-12-20 ENCOUNTER — Encounter: Payer: Self-pay | Admitting: Internal Medicine

## 2015-12-20 ENCOUNTER — Ambulatory Visit (INDEPENDENT_AMBULATORY_CARE_PROVIDER_SITE_OTHER): Payer: BLUE CROSS/BLUE SHIELD | Admitting: Internal Medicine

## 2015-12-20 VITALS — BP 112/60 | HR 74 | Temp 98.3°F | Wt 272.1 lb

## 2015-12-20 DIAGNOSIS — Z87891 Personal history of nicotine dependence: Secondary | ICD-10-CM | POA: Diagnosis not present

## 2015-12-20 DIAGNOSIS — I10 Essential (primary) hypertension: Secondary | ICD-10-CM

## 2015-12-20 DIAGNOSIS — Z79899 Other long term (current) drug therapy: Secondary | ICD-10-CM | POA: Diagnosis not present

## 2015-12-20 DIAGNOSIS — E1140 Type 2 diabetes mellitus with diabetic neuropathy, unspecified: Secondary | ICD-10-CM

## 2015-12-20 LAB — POCT GLYCOSYLATED HEMOGLOBIN (HGB A1C): Hemoglobin A1C: 5.9

## 2015-12-20 LAB — GLUCOSE, CAPILLARY: Glucose-Capillary: 118 mg/dL — ABNORMAL HIGH (ref 65–99)

## 2015-12-20 MED ORDER — AMITRIPTYLINE HCL 10 MG PO TABS: 50.0000 mg | ORAL_TABLET | Freq: Every day | ORAL | Status: DC

## 2015-12-20 NOTE — Progress Notes (Signed)
   CC: HTN, DM follow up  HPI:  [name] is a 60 y.o. male with a past medical history listed below here today for follow up of his HTN and DM.    For details of today's visit and the status of his chronic medical issues please refer to the assessment and plan.  Past Medical History:  Diagnosis Date  . Anxiety   . Arthritis    "generalized; not too bad" (02/06/2015)  . Cerebrovascular accident ([place]) 11/07/2014  . Chronic kidney disease    abn labs; "related to Metformin I was taking"  . Cough   . CVA (cerebral vascular accident) [facility]) 2009,& September 03, 2014  . Depression   . Diabetes type 2, controlled ([place]) 2004   controlled by lifestyle modification  . Diabetic peripheral neuropathy ([place])   . History of gout   . Hx of adenomatous colonic polyps 01/15/2015  . Hyperlipemia   . Hypertension   . Left sided lacunar stroke [facility])    1st stroke in 2009 2nd stroke July 2016: Acute lacunar type infarct extending from the left corona radiata through the left external capsule Thought to be due to small vessel disease     . OSA on CPAP   . Pneumonia ~ 2013 & 2014 X 2  . Shortness of breath dyspnea     Review of Systems:  Review of Systems  Eyes: Negative for blurred vision and double vision.  Musculoskeletal: Negative for falls.  Neurological: Negative for dizziness and headaches.  Psychiatric/Behavioral: Negative for depression. The patient is nervous/anxious.     Physical Exam:  Vitals:   12/20/15 1542  BP: 112/60  Pulse: 74  Temp: 98.3 F (36.8 C)  SpO2: 97%  Weight: 272 lb 1.6 oz (123.4 kg)   GENERAL- alert, co-operative, appears as stated age, not in any distress. HEENT- Atraumatic, normocephalic, PERRL, EOMI, oral mucosa appears moist CARDIAC- RRR, no murmurs, rubs or gallops. RESP- Moving equal volumes of air, and clear to auscultation bilaterally, no wheezes or crackles. ABDOMEN- Soft, nontender, bowel sounds present. NEURO- No obvious Cr N  abnormality. EXTREMITIES- pulse 2+, symmetric, no pedal edema. SKIN- Warm, dry, No rash or lesion. PSYCH- Depressed mood and affect, appropriate thought content and speech.  Assessment & Plan:   See Encounters Tab for problem based charting.  Patient discussed with Dr. Eppie Gibson

## 2015-12-20 NOTE — Patient Instructions (Addendum)
Mr. Omohundro,  I am going to start you on a new medication today. It is called amitriptyline. Take 50 mg at night before bed. This can help with the neuropathic pain as well as help with sleep. We will likely need to titrate up the dose slowly.   Your blood pressure looks better today. We will continue with our current regimen.  I will send in refills for your medications today.  I would like to see you back in 3 months for follow up.

## 2015-12-21 LAB — BMP8+ANION GAP
ANION GAP: 17 mmol/L (ref 10.0–18.0)
BUN/Creatinine Ratio: 20 (ref 9–20)
BUN: 35 mg/dL — ABNORMAL HIGH (ref 6–24)
CALCIUM: 9.3 mg/dL (ref 8.7–10.2)
CHLORIDE: 104 mmol/L (ref 96–106)
CO2: 20 mmol/L (ref 18–29)
Creatinine, Ser: 1.74 mg/dL — ABNORMAL HIGH (ref 0.76–1.27)
GFR calc non Af Amer: 42 mL/min/{1.73_m2} — ABNORMAL LOW (ref >59)
GFR, EST AFRICAN AMERICAN: 49 mL/min/{1.73_m2} — AB (ref >59)
GLUCOSE: 91 mg/dL (ref 65–99)
POTASSIUM: 5.4 mmol/L — AB (ref 3.5–5.2)
Sodium: 141 mmol/L (ref 134–144)

## 2015-12-21 LAB — VITAMIN B12: VITAMIN B 12: 437 pg/mL (ref 211–946)

## 2015-12-22 NOTE — Assessment & Plan Note (Addendum)
BP Readings from Last 3 Encounters:  12/20/15 112/60  12/05/15 140/65  11/08/15 (!) 87/56    Lab Results  Component Value Date   NA 141 12/20/2015   K 5.4 (H) 12/20/2015   CREATININE 1.74 (H) 12/20/2015    Assessment: BP 112/60 today. Denies any symptoms. Orthostatics today without any drop in BP, however he does have a jump in HR with standing. Denies any symptoms on standing.  Plan: Will continue lisinopril 30 mg daily. If develops symptoms can drop to 20.   BMET today. No electrolyte disturbance but did see a bump in his Cr from 10 months ago. However, this is similar to his Cr in the past.

## 2015-12-22 NOTE — Assessment & Plan Note (Signed)
Lab Results  Component Value Date   HGBA1C 5.9 12/20/2015   HGBA1C 5.4 01/25/2015   HGBA1C 5.9 (H) 09/04/2014     Assessment: A1c 5.9 today. Currently diet controlled.   Reports his neuropathy foot pain is getting worse. He says it starts at the base of his toes and shoots to the ends of his toes. He describes it to me today as if lighting were shooting out of his toes. Currently on Lyrica 75 mg bid. Has been on gabapentin in the past with no improvement.  Plan: Checking B12 today. Will start amitriptyline 50 mg qhs. Can titrate up as needed.   B12 wnl today

## 2015-12-24 NOTE — Progress Notes (Signed)
Case discussed with Dr. Boswell at the time of the visit. We reviewed the resident's history and exam and pertinent patient test results. I agree with the assessment, diagnosis, and plan of care documented in the resident's note. 

## 2015-12-26 ENCOUNTER — Telehealth: Payer: Self-pay

## 2015-12-26 NOTE — Telephone Encounter (Signed)
amitriptyline (ELAVIL) tablet 50 mg, is not at the pharmacy. Please call pt back.

## 2015-12-26 NOTE — Telephone Encounter (Signed)
Please send a script for the amitriptyline, pt is calling

## 2015-12-27 MED ORDER — AMITRIPTYLINE HCL 50 MG PO TABS: 50.0000 mg | ORAL_TABLET | Freq: Every day | ORAL | 2 refills | Status: DC

## 2015-12-27 NOTE — Telephone Encounter (Signed)
Sent the Rx this morning.

## 2016-01-08 ENCOUNTER — Other Ambulatory Visit: Payer: Self-pay | Admitting: Internal Medicine

## 2016-01-08 ENCOUNTER — Telehealth: Payer: Self-pay | Admitting: Dietician

## 2016-01-08 DIAGNOSIS — E1140 Type 2 diabetes mellitus with diabetic neuropathy, unspecified: Secondary | ICD-10-CM

## 2016-01-08 DIAGNOSIS — I10 Essential (primary) hypertension: Secondary | ICD-10-CM

## 2016-01-08 DIAGNOSIS — F329 Major depressive disorder, single episode, unspecified: Secondary | ICD-10-CM

## 2016-01-08 DIAGNOSIS — F32A Depression, unspecified: Secondary | ICD-10-CM

## 2016-01-08 NOTE — Telephone Encounter (Signed)
Left message on his home phone asking for return call non-urgently.  Calling to notify [patient that his retinal images done 11/08/2015 were  Read as No diabetic retinopathy. However, the results cannot be entered into his chart because they are accidentally entered under another patient's personal health information. We have tried unsuccessfully at least 3 times to get it corrected. He will be asked to return for a retake at no charge.

## 2016-01-08 NOTE — Telephone Encounter (Signed)
Patient's wife returned call: I explained the situation and that his images were resulted as normal and requested retake at his next appointment. ( Not scheduled yet but should be in January) She agreed and verbalized understanding.

## 2016-01-08 NOTE — Telephone Encounter (Signed)
Should have refills left on the celexa-will send lisinopril rx to pcp for review.Despina Hidden Cassady11/20/20173:56 PM

## 2016-01-08 NOTE — Telephone Encounter (Signed)
citalopram (CELEXA) 40 MG tablet, refill request @ [facility] on [address].

## 2016-01-09 ENCOUNTER — Other Ambulatory Visit: Payer: Self-pay | Admitting: *Deleted

## 2016-01-09 DIAGNOSIS — E1140 Type 2 diabetes mellitus with diabetic neuropathy, unspecified: Secondary | ICD-10-CM

## 2016-01-09 DIAGNOSIS — I10 Essential (primary) hypertension: Secondary | ICD-10-CM

## 2016-01-09 NOTE — Telephone Encounter (Signed)
Pt called - no one answered ; left message rx has been filled.

## 2016-01-09 NOTE — Telephone Encounter (Signed)
Pt spouse calling about refill upset sent to nurse

## 2016-02-27 ENCOUNTER — Other Ambulatory Visit: Payer: Self-pay | Admitting: Internal Medicine

## 2016-02-27 DIAGNOSIS — F32A Depression, unspecified: Secondary | ICD-10-CM

## 2016-02-27 DIAGNOSIS — F329 Major depressive disorder, single episode, unspecified: Secondary | ICD-10-CM

## 2016-03-08 ENCOUNTER — Other Ambulatory Visit: Payer: Self-pay | Admitting: *Deleted

## 2016-03-08 DIAGNOSIS — E1140 Type 2 diabetes mellitus with diabetic neuropathy, unspecified: Secondary | ICD-10-CM

## 2016-03-08 MED ORDER — PREGABALIN 75 MG PO CAPS: 75.0000 mg | ORAL_CAPSULE | Freq: Two times a day (BID) | ORAL | 2 refills | Status: DC

## 2016-03-08 NOTE — Telephone Encounter (Signed)
[facility] informed of Lyrica refill.

## 2016-03-13 ENCOUNTER — Telehealth: Payer: Self-pay | Admitting: Internal Medicine

## 2016-03-13 NOTE — Telephone Encounter (Signed)
Information sent to CoverMyMeds for PA for Lyrica awaiting decision from patient's insurance carrier. Sander Nephew, RN 03/14/2015 3:015 PM

## 2016-03-13 NOTE — Telephone Encounter (Signed)
Request given to [facility].

## 2016-03-13 NOTE — Telephone Encounter (Addendum)
States lyrica needs prior authorization in order to get refilled pt is out of medication doesn't have any for tonight

## 2016-03-15 ENCOUNTER — Telehealth: Payer: Self-pay

## 2016-03-15 NOTE — Telephone Encounter (Signed)
Needs to speak with a nurse regarding meds.  

## 2016-03-15 NOTE — Telephone Encounter (Signed)
Talked to pt's wife - stated pt has been out of Lyrica x 2 days. Talked to [place] who stated PA was done and it was denied. But she will call pt/wife back to explain.

## 2016-03-15 NOTE — Telephone Encounter (Addendum)
Spoke with [employer] Prior Authorization department about PA denial.  Was tranferred to Pharmacist for a peer to peer consultation .  Dr. Charlynn Grimes spoke with pharmacist.  Pa to be completed this afternoon with possible approval per Dr. Charlynn Grimes.  Call to patient's home number message was left on voice mail that clinics had called and that medication will probably be approved and to check with pharmacy for availability.  Patient to call the clinics on Monday if not approved or further problems.  Sander Nephew, RN 03/15/2016 5:20 PM.  Received fax from [place] was approved 03/13/2016 thru 02/17/2038.  Sander Nephew, RN 03/18/2016 10:43 AM.

## 2016-03-19 NOTE — Telephone Encounter (Signed)
Please call pt back regarding Lyrica.

## 2016-03-19 NOTE — Telephone Encounter (Signed)
Lm for rtc 

## 2016-03-25 NOTE — Telephone Encounter (Signed)
States he has gotten his medicine

## 2016-03-26 ENCOUNTER — Encounter: Payer: Self-pay | Admitting: Internal Medicine

## 2016-03-26 ENCOUNTER — Encounter (INDEPENDENT_AMBULATORY_CARE_PROVIDER_SITE_OTHER): Payer: Self-pay

## 2016-03-26 ENCOUNTER — Ambulatory Visit: Payer: BLUE CROSS/BLUE SHIELD | Admitting: Dietician

## 2016-03-26 ENCOUNTER — Ambulatory Visit (INDEPENDENT_AMBULATORY_CARE_PROVIDER_SITE_OTHER): Payer: BLUE CROSS/BLUE SHIELD | Admitting: Internal Medicine

## 2016-03-26 VITALS — BP 139/69 | HR 69 | Temp 98.7°F | Ht 72.0 in | Wt 281.4 lb

## 2016-03-26 DIAGNOSIS — E1140 Type 2 diabetes mellitus with diabetic neuropathy, unspecified: Secondary | ICD-10-CM | POA: Diagnosis not present

## 2016-03-26 DIAGNOSIS — F418 Other specified anxiety disorders: Secondary | ICD-10-CM | POA: Diagnosis not present

## 2016-03-26 DIAGNOSIS — F329 Major depressive disorder, single episode, unspecified: Secondary | ICD-10-CM

## 2016-03-26 DIAGNOSIS — I10 Essential (primary) hypertension: Secondary | ICD-10-CM | POA: Diagnosis not present

## 2016-03-26 DIAGNOSIS — F32A Depression, unspecified: Secondary | ICD-10-CM

## 2016-03-26 DIAGNOSIS — Z79899 Other long term (current) drug therapy: Secondary | ICD-10-CM

## 2016-03-26 DIAGNOSIS — F17210 Nicotine dependence, cigarettes, uncomplicated: Secondary | ICD-10-CM | POA: Diagnosis not present

## 2016-03-26 LAB — HM DIABETES EYE EXAM

## 2016-03-26 MED ORDER — PREGABALIN 150 MG PO CAPS: 150.0000 mg | ORAL_CAPSULE | Freq: Two times a day (BID) | ORAL | 2 refills | Status: DC

## 2016-03-26 MED ORDER — AMITRIPTYLINE HCL 100 MG PO TABS: 100.0000 mg | ORAL_TABLET | Freq: Every day | ORAL | 2 refills | Status: DC

## 2016-03-26 MED ORDER — GABAPENTIN 300 MG PO CAPS: 300.0000 mg | ORAL_CAPSULE | Freq: Three times a day (TID) | ORAL | 2 refills | Status: DC

## 2016-03-26 NOTE — Progress Notes (Signed)
Retinal images done and transmitted today 

## 2016-03-26 NOTE — Assessment & Plan Note (Signed)
Depression symptoms are well controlled on citalopram 40 mg daily and buspirone 7.5 mg bid. Reports anxiety is improving as well.   PHQ 2 is 0 GAD 7 is improved from 12 > 4 today.  Assessment: well controlled anxiety and depression  Plan:  Continue current medications

## 2016-03-26 NOTE — Assessment & Plan Note (Signed)
Lab Results  Component Value Date   HGBA1C 5.9 12/20/2015   HGBA1C 5.4 01/25/2015   HGBA1C 5.9 (H) 09/04/2014    Last A1c was 5.9. Diet controlled. Reports neuropathy pain is continuing to worsen. He was able to get approved for Lyrica and symptoms are somewhat improved with Lyrica however still causing significant pain.   Appears that his DM eye exam was erroneously placed on the wrong patient's chart. Appears the exam was normal but will get repeat done today as it has been unable to be placed on his chart.   Assessment: Well controlled DM with worsening neuropathy  Plan: Increase Lyrica to 150 mg bid  Increase amitriptyline to 100 mg qhs Follow up in 6 months

## 2016-03-26 NOTE — Patient Instructions (Addendum)
Mr. [name],  I am glad you are doing well today. I am going to try to increase the Lyrica but we will have to much sure it gets approved first. We will go up on the amitriptyline dose to 100 mg every night.   Please follow up with me in 6 months.

## 2016-03-26 NOTE — Assessment & Plan Note (Addendum)
BP Readings from Last 3 Encounters:  03/26/16 139/69  12/20/15 112/60  12/05/15 140/65    Lab Results  Component Value Date   NA 141 12/20/2015   K 5.4 (H) 12/20/2015   CREATININE 1.74 (H) 12/20/2015   BP today is 139/69. Goal BP is <140/90. Currently only on lisinopril 30 mg daily. Denies any headaches, vision changes, chest pain, lightheadedness/dizziness, cough. Last check K was 5.4. Denies any palpitations.   Assessment: Stable HTN  Plan: Continue current medications Re-check BMET today

## 2016-03-26 NOTE — Progress Notes (Addendum)
   CC: HTN follow up  HPI:  [name] is a 61 y.o. male with a past medical history listed below here today for follow up of his hypertension.   For details of today's visit and the status of his chronic medical issues please refer to the assessment and plan.  Past Medical History:  Diagnosis Date  . Anxiety   . Arthritis    "generalized; not too bad" (02/06/2015)  . Cerebrovascular accident ([place]) 11/07/2014  . Chronic kidney disease    abn labs; "related to Metformin I was taking"  . Cough   . CVA (cerebral vascular accident) [facility]) 2009,& September 03, 2014  . Depression   . Diabetes type 2, controlled ([place]) 2004   controlled by lifestyle modification  . Diabetic peripheral neuropathy ([place])   . History of gout   . Hx of adenomatous colonic polyps 01/15/2015  . Hyperlipemia   . Hypertension   . Left sided lacunar stroke [facility])    1st stroke in 2009 2nd stroke July 2016: Acute lacunar type infarct extending from the left corona radiata through the left external capsule Thought to be due to small vessel disease     . OSA on CPAP   . Pneumonia ~ 2013 & 2014 X 2  . Shortness of breath dyspnea     Review of Systems:   See HPI  Physical Exam:  Vitals:   03/26/16 1321  BP: 139/69  Pulse: 69  Temp: 98.7 F (37.1 C)  TempSrc: Oral  SpO2: 98%  Weight: 281 lb 6.4 oz (127.6 kg)  Height: 6' (1.829 m)   Physical Exam  Constitutional: He is well-developed, well-nourished, and in no distress. No distress.  HENT:  Head: Normocephalic and atraumatic.  Cardiovascular: Normal rate, regular rhythm and normal heart sounds.   Pulmonary/Chest: Effort normal and breath sounds normal. He has no wheezes. He has no rales.  Psychiatric: Mood and affect normal.     Assessment & Plan:   See Encounters Tab for problem based charting.  Patient discussed with Dr. Dareen Piano

## 2016-03-27 LAB — BMP8+ANION GAP
Anion Gap: 18 mmol/L (ref 10.0–18.0)
BUN / CREAT RATIO: 14 (ref 9–20)
BUN: 24 mg/dL (ref 6–24)
CO2: 20 mmol/L (ref 18–29)
CREATININE: 1.75 mg/dL — AB (ref 0.76–1.27)
Calcium: 9.6 mg/dL (ref 8.7–10.2)
Chloride: 103 mmol/L (ref 96–106)
GFR, EST AFRICAN AMERICAN: 49 mL/min/{1.73_m2} — AB (ref >59)
GFR, EST NON AFRICAN AMERICAN: 42 mL/min/{1.73_m2} — AB (ref >59)
Glucose: 99 mg/dL (ref 65–99)
POTASSIUM: 5.1 mmol/L (ref 3.5–5.2)
SODIUM: 141 mmol/L (ref 134–144)

## 2016-03-27 NOTE — Progress Notes (Signed)
Internal Medicine Clinic Attending  Case discussed with Dr. Boswell at the time of the visit.  We reviewed the resident's history and exam and pertinent patient test results.  I agree with the assessment, diagnosis, and plan of care documented in the resident's note.  

## 2016-03-29 ENCOUNTER — Encounter: Payer: Self-pay | Admitting: Dietician

## 2016-06-09 ENCOUNTER — Other Ambulatory Visit: Payer: Self-pay | Admitting: Internal Medicine

## 2016-06-09 DIAGNOSIS — F329 Major depressive disorder, single episode, unspecified: Secondary | ICD-10-CM

## 2016-06-09 DIAGNOSIS — F32A Depression, unspecified: Secondary | ICD-10-CM

## 2016-06-10 ENCOUNTER — Other Ambulatory Visit: Payer: Self-pay | Admitting: *Deleted

## 2016-06-10 DIAGNOSIS — E1140 Type 2 diabetes mellitus with diabetic neuropathy, unspecified: Secondary | ICD-10-CM

## 2016-06-11 MED ORDER — PREGABALIN 150 MG PO CAPS: 150.0000 mg | ORAL_CAPSULE | Freq: Two times a day (BID) | ORAL | 2 refills | Status: DC

## 2016-06-11 NOTE — Telephone Encounter (Signed)
Called in to [facility]

## 2016-07-19 DIAGNOSIS — R55 Syncope and collapse: Secondary | ICD-10-CM

## 2016-07-19 HISTORY — DX: Syncope and collapse: R55

## 2016-08-11 ENCOUNTER — Telehealth: Payer: Self-pay | Admitting: Oncology

## 2016-08-11 NOTE — Telephone Encounter (Signed)
Lvm advising appt chgd from 7/17 md pal to 8/7 @ 12pm.

## 2016-08-14 ENCOUNTER — Observation Stay (HOSPITAL_BASED_OUTPATIENT_CLINIC_OR_DEPARTMENT_OTHER): Payer: BLUE CROSS/BLUE SHIELD

## 2016-08-14 ENCOUNTER — Encounter (HOSPITAL_COMMUNITY): Payer: Self-pay | Admitting: *Deleted

## 2016-08-14 ENCOUNTER — Observation Stay (HOSPITAL_COMMUNITY)
Admission: EM | Admit: 2016-08-14 | Discharge: 2016-08-15 | Disposition: A | Discharge status: Home / Self Care | Payer: BLUE CROSS/BLUE SHIELD | Attending: Internal Medicine | Admitting: Internal Medicine

## 2016-08-14 ENCOUNTER — Emergency Department (HOSPITAL_COMMUNITY): Payer: BLUE CROSS/BLUE SHIELD

## 2016-08-14 DIAGNOSIS — R55 Syncope and collapse: Secondary | ICD-10-CM | POA: Diagnosis not present

## 2016-08-14 DIAGNOSIS — Z833 Family history of diabetes mellitus: Secondary | ICD-10-CM | POA: Insufficient documentation

## 2016-08-14 DIAGNOSIS — Z79899 Other long term (current) drug therapy: Secondary | ICD-10-CM | POA: Insufficient documentation

## 2016-08-14 DIAGNOSIS — S0003XA Contusion of scalp, initial encounter: Secondary | ICD-10-CM | POA: Diagnosis not present

## 2016-08-14 DIAGNOSIS — C7A8 Other malignant neuroendocrine tumors: Secondary | ICD-10-CM | POA: Diagnosis present

## 2016-08-14 DIAGNOSIS — Z794 Long term (current) use of insulin: Secondary | ICD-10-CM | POA: Insufficient documentation

## 2016-08-14 DIAGNOSIS — Z888 Allergy status to other drugs, medicaments and biological substances status: Secondary | ICD-10-CM | POA: Insufficient documentation

## 2016-08-14 DIAGNOSIS — I6502 Occlusion and stenosis of left vertebral artery: Secondary | ICD-10-CM | POA: Diagnosis not present

## 2016-08-14 DIAGNOSIS — E785 Hyperlipidemia, unspecified: Secondary | ICD-10-CM | POA: Diagnosis present

## 2016-08-14 DIAGNOSIS — F329 Major depressive disorder, single episode, unspecified: Secondary | ICD-10-CM | POA: Diagnosis not present

## 2016-08-14 DIAGNOSIS — W1809XA Striking against other object with subsequent fall, initial encounter: Secondary | ICD-10-CM | POA: Diagnosis not present

## 2016-08-14 DIAGNOSIS — N289 Disorder of kidney and ureter, unspecified: Secondary | ICD-10-CM

## 2016-08-14 DIAGNOSIS — I441 Atrioventricular block, second degree: Secondary | ICD-10-CM | POA: Diagnosis not present

## 2016-08-14 DIAGNOSIS — F419 Anxiety disorder, unspecified: Secondary | ICD-10-CM | POA: Insufficient documentation

## 2016-08-14 DIAGNOSIS — E1142 Type 2 diabetes mellitus with diabetic polyneuropathy: Secondary | ICD-10-CM | POA: Diagnosis not present

## 2016-08-14 DIAGNOSIS — F17200 Nicotine dependence, unspecified, uncomplicated: Secondary | ICD-10-CM

## 2016-08-14 DIAGNOSIS — E1122 Type 2 diabetes mellitus with diabetic chronic kidney disease: Secondary | ICD-10-CM | POA: Insufficient documentation

## 2016-08-14 DIAGNOSIS — I69351 Hemiplegia and hemiparesis following cerebral infarction affecting right dominant side: Secondary | ICD-10-CM | POA: Diagnosis not present

## 2016-08-14 DIAGNOSIS — I2721 Secondary pulmonary arterial hypertension: Secondary | ICD-10-CM | POA: Diagnosis not present

## 2016-08-14 DIAGNOSIS — I6523 Occlusion and stenosis of bilateral carotid arteries: Secondary | ICD-10-CM | POA: Diagnosis not present

## 2016-08-14 DIAGNOSIS — Y9389 Activity, other specified: Secondary | ICD-10-CM | POA: Insufficient documentation

## 2016-08-14 DIAGNOSIS — Z8249 Family history of ischemic heart disease and other diseases of the circulatory system: Secondary | ICD-10-CM | POA: Insufficient documentation

## 2016-08-14 DIAGNOSIS — Y92003 Bedroom of unspecified non-institutional (private) residence as the place of occurrence of the external cause: Secondary | ICD-10-CM | POA: Diagnosis not present

## 2016-08-14 DIAGNOSIS — I129 Hypertensive chronic kidney disease with stage 1 through stage 4 chronic kidney disease, or unspecified chronic kidney disease: Secondary | ICD-10-CM | POA: Diagnosis not present

## 2016-08-14 DIAGNOSIS — R7303 Prediabetes: Secondary | ICD-10-CM | POA: Diagnosis present

## 2016-08-14 DIAGNOSIS — I2510 Atherosclerotic heart disease of native coronary artery without angina pectoris: Secondary | ICD-10-CM | POA: Diagnosis not present

## 2016-08-14 DIAGNOSIS — F17210 Nicotine dependence, cigarettes, uncomplicated: Secondary | ICD-10-CM | POA: Insufficient documentation

## 2016-08-14 DIAGNOSIS — N179 Acute kidney failure, unspecified: Secondary | ICD-10-CM

## 2016-08-14 DIAGNOSIS — E1140 Type 2 diabetes mellitus with diabetic neuropathy, unspecified: Secondary | ICD-10-CM

## 2016-08-14 DIAGNOSIS — Z9889 Other specified postprocedural states: Secondary | ICD-10-CM | POA: Insufficient documentation

## 2016-08-14 DIAGNOSIS — N183 Chronic kidney disease, stage 3 (moderate): Secondary | ICD-10-CM | POA: Insufficient documentation

## 2016-08-14 DIAGNOSIS — Z7982 Long term (current) use of aspirin: Secondary | ICD-10-CM | POA: Insufficient documentation

## 2016-08-14 DIAGNOSIS — Z86010 Personal history of colonic polyps: Secondary | ICD-10-CM | POA: Insufficient documentation

## 2016-08-14 DIAGNOSIS — I10 Essential (primary) hypertension: Secondary | ICD-10-CM | POA: Diagnosis present

## 2016-08-14 DIAGNOSIS — F32A Depression, unspecified: Secondary | ICD-10-CM | POA: Diagnosis present

## 2016-08-14 DIAGNOSIS — T148XXA Other injury of unspecified body region, initial encounter: Secondary | ICD-10-CM | POA: Diagnosis not present

## 2016-08-14 DIAGNOSIS — G4733 Obstructive sleep apnea (adult) (pediatric): Secondary | ICD-10-CM | POA: Diagnosis not present

## 2016-08-14 DIAGNOSIS — E7800 Pure hypercholesterolemia, unspecified: Secondary | ICD-10-CM | POA: Diagnosis not present

## 2016-08-14 DIAGNOSIS — G4489 Other headache syndrome: Secondary | ICD-10-CM | POA: Diagnosis not present

## 2016-08-14 DIAGNOSIS — N189 Chronic kidney disease, unspecified: Secondary | ICD-10-CM

## 2016-08-14 DIAGNOSIS — I491 Atrial premature depolarization: Secondary | ICD-10-CM | POA: Diagnosis not present

## 2016-08-14 HISTORY — DX: Malignant (primary) neoplasm, unspecified: C80.1

## 2016-08-14 HISTORY — DX: Syncope and collapse: R55

## 2016-08-14 LAB — GLUCOSE, CAPILLARY
GLUCOSE-CAPILLARY: 99 mg/dL (ref 65–99)
GLUCOSE-CAPILLARY: 170 mg/dL — AB (ref 65–99)
GLUCOSE-CAPILLARY: 91 mg/dL (ref 65–99)
Glucose-Capillary: 120 mg/dL — ABNORMAL HIGH (ref 65–99)

## 2016-08-14 LAB — DIFFERENTIAL
BASOS ABS: 0 10*3/uL (ref 0.0–0.1)
BASOS PCT: 0 %
Eosinophils Absolute: 0.3 10*3/uL (ref 0.0–0.7)
Eosinophils Relative: 4 %
LYMPHS PCT: 22 %
Lymphs Abs: 1.8 10*3/uL (ref 0.7–4.0)
Monocytes Absolute: 0.5 10*3/uL (ref 0.1–1.0)
Monocytes Relative: 6 %
NEUTROS ABS: 5.7 10*3/uL (ref 1.7–7.7)
Neutrophils Relative %: 68 %

## 2016-08-14 LAB — BASIC METABOLIC PANEL
ANION GAP: 7 (ref 5–15)
BUN: 30 mg/dL — ABNORMAL HIGH (ref 6–20)
CALCIUM: 9 mg/dL (ref 8.9–10.3)
CO2: 25 mmol/L (ref 22–32)
Chloride: 107 mmol/L (ref 101–111)
Creatinine, Ser: 1.93 mg/dL — ABNORMAL HIGH (ref 0.61–1.24)
GFR, EST AFRICAN AMERICAN: 42 mL/min — AB (ref >60)
GFR, EST NON AFRICAN AMERICAN: 37 mL/min — AB (ref >60)
Glucose, Bld: 136 mg/dL — ABNORMAL HIGH (ref 65–99)
POTASSIUM: 4.6 mmol/L (ref 3.5–5.1)
Sodium: 139 mmol/L (ref 135–145)

## 2016-08-14 LAB — RAPID URINE DRUG SCREEN, HOSP PERFORMED
AMPHETAMINES: NOT DETECTED
BENZODIAZEPINES: POSITIVE — AB
Barbiturates: NOT DETECTED
COCAINE: POSITIVE — AB
OPIATES: POSITIVE — AB
TETRAHYDROCANNABINOL: POSITIVE — AB

## 2016-08-14 LAB — CBG MONITORING, ED: GLUCOSE-CAPILLARY: 121 mg/dL — AB (ref 65–99)

## 2016-08-14 LAB — CBC
HEMATOCRIT: 45 % (ref 39.0–52.0)
HEMOGLOBIN: 14.5 g/dL (ref 13.0–17.0)
MCH: 30.3 pg (ref 26.0–34.0)
MCHC: 32.2 g/dL (ref 30.0–36.0)
MCV: 93.9 fL (ref 78.0–100.0)
Platelets: 171 10*3/uL (ref 150–400)
RBC: 4.79 MIL/uL (ref 4.22–5.81)
RDW: 13.8 % (ref 11.5–15.5)
WBC: 8.6 10*3/uL (ref 4.0–10.5)

## 2016-08-14 LAB — ECHOCARDIOGRAM COMPLETE
Height: 72 in
Weight: 4792 oz

## 2016-08-14 LAB — TROPONIN I

## 2016-08-14 MED ORDER — NICOTINE 14 MG/24HR TD PT24
14.0000 mg | MEDICATED_PATCH | Freq: Every day | TRANSDERMAL | Status: DC
  Administered 2016-08-14 – 2016-08-15 (×2): 14 mg via TRANSDERMAL
  Filled 2016-08-14 (×2): qty 1

## 2016-08-14 MED ORDER — AMITRIPTYLINE HCL 50 MG PO TABS
100.0000 mg | ORAL_TABLET | Freq: Every day | ORAL | Status: DC
  Administered 2016-08-14: 100 mg via ORAL
  Filled 2016-08-14: qty 2

## 2016-08-14 MED ORDER — ATORVASTATIN CALCIUM 40 MG PO TABS
40.0000 mg | ORAL_TABLET | Freq: Every day | ORAL | Status: DC
  Administered 2016-08-14: 40 mg via ORAL
  Filled 2016-08-14: qty 1

## 2016-08-14 MED ORDER — ACETAMINOPHEN 325 MG PO TABS
650.0000 mg | ORAL_TABLET | Freq: Four times a day (QID) | ORAL | Status: DC | PRN
  Administered 2016-08-14: 650 mg via ORAL
  Filled 2016-08-14: qty 2

## 2016-08-14 MED ORDER — CITALOPRAM HYDROBROMIDE 20 MG PO TABS
40.0000 mg | ORAL_TABLET | Freq: Every day | ORAL | Status: DC
  Administered 2016-08-14 – 2016-08-15 (×2): 40 mg via ORAL
  Filled 2016-08-14 (×2): qty 2

## 2016-08-14 MED ORDER — SODIUM CHLORIDE 0.9 % IV BOLUS (SEPSIS)
1000.0000 mL | Freq: Once | INTRAVENOUS | Status: AC
  Administered 2016-08-14: 1000 mL via INTRAVENOUS

## 2016-08-14 MED ORDER — ENOXAPARIN SODIUM 40 MG/0.4ML ~~LOC~~ SOLN
40.0000 mg | Freq: Every day | SUBCUTANEOUS | Status: DC
  Administered 2016-08-14 – 2016-08-15 (×2): 40 mg via SUBCUTANEOUS
  Filled 2016-08-14 (×2): qty 0.4

## 2016-08-14 MED ORDER — SODIUM CHLORIDE 0.9 % IV SOLN
INTRAVENOUS | Status: AC
  Administered 2016-08-14 (×3): via INTRAVENOUS

## 2016-08-14 MED ORDER — BUSPIRONE HCL 15 MG PO TABS
7.5000 mg | ORAL_TABLET | Freq: Two times a day (BID) | ORAL | Status: DC
  Administered 2016-08-14 – 2016-08-15 (×3): 7.5 mg via ORAL
  Filled 2016-08-14 (×3): qty 1

## 2016-08-14 MED ORDER — INSULIN ASPART 100 UNIT/ML ~~LOC~~ SOLN
0.0000 [IU] | Freq: Three times a day (TID) | SUBCUTANEOUS | Status: DC
  Administered 2016-08-14 – 2016-08-15 (×2): 2 [IU] via SUBCUTANEOUS

## 2016-08-14 MED ORDER — ASPIRIN EC 81 MG PO TBEC
81.0000 mg | DELAYED_RELEASE_TABLET | Freq: Every day | ORAL | Status: DC
  Administered 2016-08-14 – 2016-08-15 (×2): 81 mg via ORAL
  Filled 2016-08-14 (×2): qty 1

## 2016-08-14 MED ORDER — PREGABALIN 75 MG PO CAPS
150.0000 mg | ORAL_CAPSULE | Freq: Two times a day (BID) | ORAL | Status: DC
  Administered 2016-08-14 – 2016-08-15 (×3): 150 mg via ORAL
  Filled 2016-08-14 (×3): qty 2

## 2016-08-14 MED ORDER — SODIUM CHLORIDE 0.9% FLUSH
3.0000 mL | Freq: Two times a day (BID) | INTRAVENOUS | Status: DC
  Administered 2016-08-14: 3 mL via INTRAVENOUS

## 2016-08-14 MED ORDER — ACETAMINOPHEN 650 MG RE SUPP: 650.0000 mg | Freq: Four times a day (QID) | RECTAL | Status: DC | PRN

## 2016-08-14 NOTE — Progress Notes (Signed)
Pt had a 2.14 sec pause. He's asymptomatic MD notified. Will continue to monitor pt.

## 2016-08-14 NOTE — H&P (Signed)
Date: 08/14/2016               Patient Name:  [name] MRN: [id number]  DOB: [birth year] Age / Sex: 61 y.o., male   PCP: [name], MD         Medical Service: Internal Medicine Teaching Service         Attending Physician: Dr. Delora Fuel, MD    First Contact: Dr. Jari Favre Pager: [phone]  Second Contact: Dr. Juleen China Pager: [phone]       After Hours (After 5p/  First Contact Pager: [phone]  weekends / holidays): Second Contact Pager: [phone]   Chief Complaint: Syncope  History of Present Illness: Patient is a 61 yo M with a pmhx of HTN, HLD, OSA, Type II diabetes, and CVA (2009 and 2016) presenting with syncope. Patient is accompanied by his wife who helped provide history. Patient reports he got up to use the bathroom around 2:45 am. When trying to stand up he suddenly fell into the wall in front of his bed and hit his head. Wife heard him fall and started calling his name but he wasn't responding. She walked around to his side of the bed and found him slumped down on the floor. Patient reports he could hear his wife calling but couldn't respond. Wife believes he lost consciousness. He recovered back to baseline after about 30 seconds. Once he finally got to the bathroom, he was sitting on the toilet and subsequently fell over to his side. Wife heard gurgling noises coming from the bathroom and went to check on him. Patient again denies LOC but wife disagrees. Reports he was out for about a minute before recovering to baseline. He denies any prodromal symptoms to these episodes. Wife denies witnessing any any seizure activity. No tongue biting or incontinence. Prior to these episodes, patient was in his normal state of health. He denies fevers, cough, chest pain, SOB, and N/V/D. He endorses good po intake. Wife called EMS after the second syncopal episode. Per report he was orthostatic on EMS arrival with BP 110/71, HR 78 sitting and BP 85/43, HR 98 on standing. He  received a 500 cc bolus en route.   On arrival to the ED, patient was hemodynamically stable with BP 106/66, HR 78, RR 18, and oxygen 94% on RA. Repeat orthostatic vitals on arrival were negative. Labs notable for acute on chronic renal dysfunction, creatine of 1.9 from baseline 1.7. CBC unremarkable. CBG was 121. Troponin was < 0.03. CT head was notable for a small right parietal scalp contusion without fracture, an age-indeterminate lacunar infarct on the right caudate head, and chronic infarcts in the left lentiform nucleus and periventricular white matter of the left frontal lobe. CT cervical spine without acute fracture or dislocation.   Meds:  No outpatient prescriptions have been marked as taking for the 08/14/16 encounter [facility] Encounter).     Allergies: Allergies as of 08/14/2016 - Review Complete 08/14/2016  Allergen Reaction Noted  . Wellbutrin [bupropion] Other (See Comments) 01/30/2015   Past Medical History:  Diagnosis Date  . Anxiety   . Arthritis    "generalized; not too bad" (02/06/2015)  . Cerebrovascular accident ([place]) 11/07/2014  . Chronic kidney disease    abn labs; "related to Metformin I was taking"  . Cough   . CVA (cerebral vascular accident) [facility]) 2009,& September 03, 2014  . Depression   . Diabetes type 2, controlled ([place]) 2004   controlled by lifestyle modification  .  Diabetic peripheral neuropathy ([place])   . History of gout   . Hx of adenomatous colonic polyps 01/15/2015  . Hyperlipemia   . Hypertension   . Left sided lacunar stroke [facility])    1st stroke in 2009 2nd stroke July 2016: Acute lacunar type infarct extending from the left corona radiata through the left external capsule Thought to be due to small vessel disease     . OSA on CPAP   . Pneumonia ~ 2013 & 2014 X 2  . Shortness of breath dyspnea     Family History: Mom and Dad with diabetes and heart disease.   Social History: Smokes cigarettes, 1PPD. Previously quit for 6 years but recently  started back. Has been smoking since the age of 7. Drink alcohol occasionally. Denies illicit drug use.   Review of Systems: A complete ROS was negative except as per HPI.   Physical Exam: Blood pressure 106/66, pulse 78, resp. rate 18, height 6' (1.829 m), weight 275 lb (124.7 kg), SpO2 94 %. Constitutional: NAD, appears comfortable, obses HEENT: Atraumatic, normocephalic. PERRL, anicteric sclera.  Cardiovascular: Distant heart sounds but RRR Pulmonary/Chest: CTAB, no wheezes, rales, or rhonchi.  Abdominal: Soft, non tender, non distended. +BS.  Extremities: Warm and well perfused. Distal pulses intact. No edema.  Neurological: A&Ox3, CN II - XII grossly intact. No focal neurological deficits. Strength and sensation intact bilateral. Finger to nose and heel to shin test is normal.  Skin: No rashes or erythema  Psychiatric: Normal mood and affect  EKG: Personally reviewed. Low voltage complexes but sinus rhythm. Q waves in V1, V2, and V3 unchanged from prior tracing. No new ischemic changes.   CT Head & Cervical Spine:  IMPRESSION: 1. Small right parietal region scalp contusion. No displaced calvarial fracture. 2. Age-indeterminate lacunar infarct of the right caudate head. 3. Chronic infarcts in left lentiform nucleus and periventricular white matter of left frontal lobe. 4. No acute fracture or dislocation of the cervical spine.  Assessment & Plan by Problem:  Patient is a 61 yo M with a pmhx of HTN, HLD, OSA, Type II diabetes, and CVA (2009 and 2016) admitted for work up for syncope.   Syncope: Unclear cause. Patient denies prodromal symptoms and was in his usual state of health prior to these episodes. Per report, he was orthostatic on EMS arrival and received a 500 cc bolus en route. However repeat orthostatic vitals in the ED are negative (lying BP 110/69, HR -> sitting BP 111/79, HR 73 -> standing BP 103/68, HR 76). He endorse a good appetite without clear reason to be volume  down. Denies N/V/D, no recent medication changes. Last echocardiogram performed during his stroke work up in 2016 was negative for aortic stenosis (EF 60-65%, normal wall motion, G1DD). EKG is non ischemic with sinus rhythm and trop was < 0.03. CT does show an age indeterminate right lacunar infarct (hx of left lacunar infarct in 2016) however neuro exam is without deficits and patient appears to be back to his baseline.  -- 1L NS bolus  -- Tele monitoring  -- Echocardiogram  -- UDS   Renal Dysfunction: Acute on chronic, creatinine 1.9 from baseline 1.7 -- 1L NS bolus -- Hold ACEi  -- Repeat AM labs  HTN: Normotensive -- Hold home lisinopril 30 mg daily   HLD: -- Continue Lipitor 40 mg daily   Hx DM: Diet controlled, last A1c 5.9 on 12/20/2015 -- Repeat A1c -- SSI-sensitive  -- Continue home Lyrica 150 mg for  neuropathy   Hx Depression/Anxiety: -- Continue home celexa 40 mg daily  -- Buspar 7.5 mg BID  FEN: 1L NS bolus, replete lytes prn, carb mod VTE ppx: Lovenox  Code Status: FULL  Dispo: Admit patient to Observation with expected length of stay less than 2 midnights.  SignedVelna Ochs, MD 08/14/2016, 5:19 AM  Pager: [phone]

## 2016-08-14 NOTE — ED Notes (Signed)
Fall band and yellow socks placed on pt.

## 2016-08-14 NOTE — ED Notes (Signed)
Admitting Provider at bedside. 

## 2016-08-14 NOTE — Progress Notes (Signed)
   Subjective:  Patient states he feels at his baseline this morning. He denies chest pain, shortness of breath, headache, vision or hearing changes, dizziness. He states that prior to episodes of syncope early this morning, he had not had any presyncopal symptoms; he denies recent illness, new medications, or change in eating habits.   He has not yet gotten out of bed yet since coming to the hospital.  Objective:  Vital signs in last 24 hours: Vitals:   08/14/16 0445 08/14/16 0500 08/14/16 0614 08/14/16 0623  BP: 105/62 103/62  115/73  Pulse: 64 73  75  Resp: 14 16  18   Temp:   98.2 F (36.8 C) 97.8 F (36.6 C)  TempSrc:   Oral Oral  SpO2: 96% 95%  97%  Weight:    299 lb 8 oz (135.9 kg)  Height:    6' (1.829 m)   Constitutional: NAD, lying comfortably in bed CV: very distant heart sounds, but no appreciable murmur, rubs or gallops; pulse regular; no LE edema Resp: CTAB, no increased work of breathing, no wheezing Neuro: CN 2-12 grossly intact, moving all extremities, strength intact  Assessment/Plan:  Principal Problem:   Syncope Active Problems:   Controlled type 2 diabetes mellitus with diabetic neuropathy (HCC)   Hyperlipidemia   OSA (obstructive sleep apnea)   Depression   Pulmonary artery hypertension (HCC)   Essential hypertension   Tobacco use disorder   Primary malignant neuroendocrine neoplasm of ileum pT3, 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 Last Echo in 2016 showed EF 60-65%, normal wall motion and G1DD. Telemetry review from admission shows possible 1st degree block (not appreciated on EKG), and a couple of missed beats ~2secs.  --f/u echo --repeat EKG --cards consulted - appreciate their input --IVF 100cc/hr NS  Chronic renal dysfunction: Patient with CKD  with mild increase in Cr from 1.7 to 1.9 on admission. He has received 1L NS bolus on admission and ACE-I has been held for now. --f/u AM Bmet --IVF  HTN: Patient continues to be normotensive. --hold home lisinopril  HLD: Continue home lipitor  T2DM - diet controlled: --f/u A1c --SSI-S --Lyrica 150mg  BID and amitriptyline 100mg  qhs for neuropathy  Depression/Anxiety: Continue home celexa and buspar.  Dispo: Anticipated discharge in approximately 1-2 day(s).   Alphonzo Grieve, MD 08/14/2016, 9:18 AM Pager [phone]

## 2016-08-14 NOTE — ED Provider Notes (Signed)
[facility] DEPT Provider Note   CSN: [id number] Arrival date & time: 08/14/16  0409     History   Chief Complaint Chief Complaint  Patient presents with  . Loss of Consciousness    HPI [name] is a 61 y.o. male.  The history is provided by the patient.  He had 2 syncopal episodes at home. He got out of bed to go to the bathroom, but passed out after only taking a few steps. Apparently, his head did strike the wall. He then was able to get up and get to the bathroom and had another syncopal episode while urinating. He denies any prodrome. He denies chest pain, heaviness, tightness, pressure. He denies any palpitations. EMS reported blood pressure drop in heart rate rise with going from sitting to standing, and he did give him 500 mL of normal saline en route. He does have history of stroke with mild right-sided weakness.  Past Medical History:  Diagnosis Date  . Anxiety   . Arthritis    "generalized; not too bad" (02/06/2015)  . Cerebrovascular accident ([place]) 11/07/2014  . Chronic kidney disease    abn labs; "related to Metformin I was taking"  . Cough   . CVA (cerebral vascular accident) [facility]) 2009,& September 03, 2014  . Depression   . Diabetes type 2, controlled ([place]) 2004   controlled by lifestyle modification  . Diabetic peripheral neuropathy ([place])   . History of gout   . Hx of adenomatous colonic polyps 01/15/2015  . Hyperlipemia   . Hypertension   . Left sided lacunar stroke [facility])    1st stroke in 2009 2nd stroke July 2016: Acute lacunar type infarct extending from the left corona radiata through the left external capsule Thought to be due to small vessel disease     . OSA on CPAP   . Pneumonia ~ 2013 & 2014 X 2  . Shortness of breath dyspnea     Patient Active Problem List   Diagnosis Date Noted  . Seborrheic dermatitis 04/27/2015  . Primary malignant neuroendocrine neoplasm of ileum pT3, pN1 s/p SB resection 02/06/2015 02/09/2015  . Hx of  adenomatous colonic polyps 01/15/2015  . Essential hypertension 11/07/2014  . Tobacco use disorder 11/07/2014  . Pulmonary artery hypertension ([place]) 10/07/2014  . Coronary artery calcification seen on CT scan 10/07/2014  . Depression 09/08/2014  . Healthcare maintenance 09/08/2014  . Diastolic dysfunction 40/98/1191  . Left sided lacunar stroke ([place])   . Hyperlipidemia 09/03/2014  . OSA (obstructive sleep apnea) 09/03/2014  . Controlled type 2 diabetes mellitus with diabetic neuropathy ([place]) 09/03/2002    Past Surgical History:  Procedure Laterality Date  . APPENDECTOMY  1980's  . BOWEL RESECTION N/A 02/06/2015   Procedure: SMALL BOWEL RESECTION;  Surgeon: Michael Boston, MD;  Location: [facility];  Service: General;  Laterality: N/A;  . CARDIAC CATHETERIZATION  2016  . CAROTID ENDARTERECTOMY Left 11/2007   mptes 6132-11  . HERNIA REPAIR    . KNEE ARTHROSCOPY Left X 2  . LAPAROSCOPIC ASSISTED VENTRAL HERNIA REPAIR  02/06/2015  . LAPAROSCOPIC SMALL BOWEL RESECTION  02/06/2015  . LAPAROSCOPY N/A 02/06/2015   Procedure: LAPAROSCOPY DIAGNOSTIC;  Surgeon: Michael Boston, MD;  Location: [facility];  Service: General;  Laterality: N/A;       Home Medications    Prior to Admission medications   Medication Sig Start Date End Date Taking? Authorizing Provider  amitriptyline (ELAVIL) 100 MG tablet Take 1 tablet (100 mg total) by mouth at  bedtime. 03/26/16   [name], MD  aspirin EC 81 MG tablet Take 1 tablet (81 mg total) by mouth daily. 04/27/15   Loleta Chance, MD  atorvastatin (LIPITOR) 40 MG tablet Take 1 tablet (40 mg total) by mouth daily at 6 PM. 11/08/15   [name], MD  busPIRone (BUSPAR) 7.5 MG tablet TAKE ONE TABLET BY MOUTH TWICE DAILY 06/10/16   [name], MD  citalopram (CELEXA) 40 MG tablet Take 1 tablet (40 mg total) by mouth daily. 11/08/15   [name], MD  lisinopril (PRINIVIL,ZESTRIL) 30 MG tablet TAKE ONE TABLET BY MOUTH ONCE DAILY 01/09/16   [name], MD    pregabalin (LYRICA) 150 MG capsule Take 1 capsule (150 mg total) by mouth 2 (two) times daily. 06/11/16   [name], MD    Family History Family History  Problem Relation Age of Onset  . Stroke Father   . Hypertension Father   . Diabetes Father   . Heart failure Father   . Colon polyps Father   . Hypertension Mother   . Diabetes Mother   . Heart failure Mother   . Colon polyps Mother   . Hypertension Sister   . Hypertension Brother   . Diabetes Sister   . Diabetes Brother   . Colon cancer Neg Hx     Social History Social History  Substance Use Topics  . Smoking status: Former Smoker    Packs/day: 1.00    Years: 30.00    Types: Cigarettes    Quit date: 02/02/2015  . Smokeless tobacco: Never Used  . Alcohol use No     Allergies   Wellbutrin [bupropion]   Review of Systems Review of Systems  All other systems reviewed and are negative.    Physical Exam Updated Vital Signs BP 106/66   Pulse 78   Resp 18   Ht 6' (1.829 m)   Wt 124.7 kg (275 lb)   SpO2 94%   BMI 37.30 kg/m   Physical Exam  Nursing note and vitals reviewed.  61 year old male, resting comfortably and in no acute distress. Vital signs are normal. Oxygen saturation is 94%, which is normal. Head is normocephalic and atraumatic. PERRLA, EOMI. Oropharynx is clear. Neck is nontender and supple without adenopathy or JVD. There are no carotid bruits. Back is nontender and there is no CVA tenderness. Lungs are clear without rales, wheezes, or rhonchi. Chest is nontender. Heart has regular rate and rhythm without murmur. Abdomen is soft, flat, nontender without masses or hepatosplenomegaly and peristalsis is normoactive. Extremities have no cyanosis or edema, full range of motion is present. Skin is warm and dry without rash. Neurologic: Mental status is normal, cranial nerves are significant for mild right central facial droop, there are no motor or sensory deficits. Mild to moderate ataxia  is noted with the right arm and right leg.  ED Treatments / Results  Labs (all labs ordered are listed, but only abnormal results are displayed) Labs Reviewed  BASIC METABOLIC PANEL - Abnormal; Notable for the following:       Result Value   Glucose, Bld 136 (*)    BUN 30 (*)    Creatinine, Ser 1.93 (*)    GFR calc non Af Amer 37 (*)    GFR calc Af Amer 42 (*)    All other components within normal limits  CBG MONITORING, ED - Abnormal; Notable for the following:    Glucose-Capillary 121 (*)    All other components  within normal limits  CBC  TROPONIN I  DIFFERENTIAL  URINALYSIS, ROUTINE W REFLEX MICROSCOPIC    EKG  EKG Interpretation  Date/Time:  Wednesday August 14 2016 04:12:20 EDT Ventricular Rate:  75 PR Interval:    QRS Duration: 84 QT Interval:  395 QTC Calculation: 442 R Axis:     Text Interpretation:  Sinus rhythm Prolonged PR interval Anterior infarct, old When compared with ECG of 09/03/2014, No significant change was found Confirmed by Delora Fuel ([id number]) on 08/14/2016 4:24:00 AM       Radiology Ct Head Wo Contrast  Result Date: 08/14/2016 CLINICAL DATA:  61 y/o M; syncope with head injury. History of CVA and carotid endarterectomy. EXAM: CT HEAD WITHOUT CONTRAST CT CERVICAL SPINE WITHOUT CONTRAST TECHNIQUE: Multidetector CT imaging of the head and cervical spine was performed following the standard protocol without intravenous contrast. Multiplanar CT image reconstructions of the cervical spine were also generated. COMPARISON:  09/05/2014 MRI of the head.  09/03/2014 CT of the head. FINDINGS: CT HEAD FINDINGS Brain: No large territory acute infarct, intracranial hemorrhage, or focal mass effect identified. Chronic infarcts are present within the left lentiform nucleus and periventricular white matter of the left frontal lobe. There is a new lucency within the right caudate head compatible with age indeterminate lacunar infarct. Ex vacuo dilatation of the left lateral  ventricle. Vascular: Mild calcific atherosclerosis of the carotid siphons. Skull: Soft tissue swelling in the right parietal scalp compatible with small contusion. No displaced calvarial fracture. Sinuses/Orbits: No acute finding. Other: None. CT CERVICAL SPINE FINDINGS Alignment: Normal. Skull base and vertebrae: No acute fracture. No primary bone lesion or focal pathologic process. Soft tissues and spinal canal: No prevertebral fluid or swelling. No visible canal hematoma. Disc levels: Mild cervical spondylosis with predominantly discogenic degenerative changes from the C4 through C6 levels. Upper chest: Negative. Other: Mild calcific atherosclerosis of carotid bifurcation. Postsurgical changes related to left carotid endarterectomy. IMPRESSION: 1. Small right parietal region scalp contusion. No displaced calvarial fracture. 2. Age-indeterminate lacunar infarct of the right caudate head. 3. Chronic infarcts in left lentiform nucleus and periventricular white matter of left frontal lobe. 4. No acute fracture or dislocation of the cervical spine. Electronically Signed   By: Kristine Garbe M.D.   On: 08/14/2016 06:01   Ct Cervical Spine Wo Contrast  Result Date: 08/14/2016 CLINICAL DATA:  61 y/o M; syncope with head injury. History of CVA and carotid endarterectomy. EXAM: CT HEAD WITHOUT CONTRAST CT CERVICAL SPINE WITHOUT CONTRAST TECHNIQUE: Multidetector CT imaging of the head and cervical spine was performed following the standard protocol without intravenous contrast. Multiplanar CT image reconstructions of the cervical spine were also generated. COMPARISON:  09/05/2014 MRI of the head.  09/03/2014 CT of the head. FINDINGS: CT HEAD FINDINGS Brain: No large territory acute infarct, intracranial hemorrhage, or focal mass effect identified. Chronic infarcts are present within the left lentiform nucleus and periventricular white matter of the left frontal lobe. There is a new lucency within the right  caudate head compatible with age indeterminate lacunar infarct. Ex vacuo dilatation of the left lateral ventricle. Vascular: Mild calcific atherosclerosis of the carotid siphons. Skull: Soft tissue swelling in the right parietal scalp compatible with small contusion. No displaced calvarial fracture. Sinuses/Orbits: No acute finding. Other: None. CT CERVICAL SPINE FINDINGS Alignment: Normal. Skull base and vertebrae: No acute fracture. No primary bone lesion or focal pathologic process. Soft tissues and spinal canal: No prevertebral fluid or swelling. No visible canal hematoma. Disc levels: Mild  cervical spondylosis with predominantly discogenic degenerative changes from the C4 through C6 levels. Upper chest: Negative. Other: Mild calcific atherosclerosis of carotid bifurcation. Postsurgical changes related to left carotid endarterectomy. IMPRESSION: 1. Small right parietal region scalp contusion. No displaced calvarial fracture. 2. Age-indeterminate lacunar infarct of the right caudate head. 3. Chronic infarcts in left lentiform nucleus and periventricular white matter of left frontal lobe. 4. No acute fracture or dislocation of the cervical spine. Electronically Signed   By: Kristine Garbe M.D.   On: 08/14/2016 06:01    Procedures Procedures (including critical care time)  Medications Ordered in ED Medications - No data to display   Initial Impression / Assessment and Plan / ED Course  I have reviewed the triage vital signs and the nursing notes.  Pertinent labs & imaging results that were available during my care of the patient were reviewed by me and considered in my medical decision making (see chart for details).  Syncopal episodes 2. No evidence of serious injury, but he will be sent for CT of head and cervical spine. ECG shows no acute changes. Old records are reviewed, and he has no relevant past visits. Blood pressure is noted to be somewhat low, but blood pressure at office visits  as fluctuated dramatically and he has had office blood pressures in the range that his current reading is at. Screening labs are obtained.   Mild to moderate renal insufficiency is noted, with creatinine increase from 1.75 in February to 1.93 today. Remainder of laboratory workup is unremarkable. He will need to be admitted for further evaluation of his syncope.Case is discussed with Dr. Charlynn Grimes of internal medicine teaching service who agrees to admit the patient.  Final Clinical Impressions(s) / ED Diagnoses   Final diagnoses:  Syncope and collapse  Renal insufficiency    New Prescriptions New Prescriptions   No medications on file     Delora Fuel, MD 67/67/20 [phone]

## 2016-08-14 NOTE — Progress Notes (Signed)
Pt had a 2 second pause. He's asymptomatic and is resting comfortably. Will continue to monitor closely.

## 2016-08-14 NOTE — Consult Note (Signed)
Cardiology Consult    Patient ID: [name] MRN: [id number], DOB/AGE: 11/09/1955   Admit date: 08/14/2016 Date of Consult: 08/14/2016  Primary Physician: [name], MD Reason for Consult: Syncope Primary Cardiologist: New - Dr. Oval Linsey Requesting Provider: Dr. Heber [place]   History of Present Illness    [name] is a 61 y.o. male with past medical history of HTN, HLD, Stage 3 CKD, prior CVA (occuring in 2009 and 2016), and continued tobacco use who is being seen today for the evaluation of syncope at the request of Dr. Heber Crooks.   He presented to [facility] ED on 08/14/2016 for two syncopal events. He reports getting out of bed over night to go to the restroom. Upon standing up, he passed out and hit the floor. His wife believes he lost consciousness for about 1 minute. He denies any prodromal symptoms. No bowel or bladder incontinence. No seizure-like activity witnessed. He stood up and continued to walk to the restroom, but had a recurrent syncopal event while sitting on the toilet. He fell forward and hit his head on the counter. At this point in time, his wife called EMS. Again, his estimated LOC was approximately 1 minute.   He denies any associated chest discomfort, palpitations, dyspnea, diaphoresis, nausea, or vomiting. He denies any recurrent symptoms since being admitted. Reports staying adequately hydrated. He did mow the yard two days ago but did not have any symptoms at that time. No recent diet changes, medication changes, or recent illnesses.  He denies any prior syncopal events. No known history of CAD or cardiac arrhythmias. Reports a family history of CAD with his father having an MI in his 59s and mother having CHF and atrial fibrillation. Reports smoking 1 ppd. Denies any alcohol use or recreational drug use.   Initial labs show WBC of 8.6, Hgb 14.5, platelets 171. Na+ 139, K+ 4.6, creatinine 1.93 (increased from 1.75 in 03/2016). Initial troponin  negative. CT Head shows a small right parietal scalp contusion with age-indeterminate lacunar infarct with chronic infarcts in the left lentiform nucleus and periventricular white matter of the left frontal lobe. EKG shows NSR, HR 64, with 1st degree AV Block.    Past Medical History   Past Medical History:  Diagnosis Date  . Anxiety   . Arthritis    "generalized; not too bad" (02/06/2015)  . Cancer ([place])    NEOPLASIM OF ILEUM  . Cerebrovascular accident ([place]) 11/07/2014  . Chronic kidney disease    abn labs; "related to Metformin I was taking"  . Cough   . CVA (cerebral vascular accident) [facility]) 2009,& September 03, 2014  . Depression   . Diabetes type 2, controlled ([place]) 2004   controlled by lifestyle modification  . Diabetic peripheral neuropathy ([place])   . History of gout   . Hx of adenomatous colonic polyps 01/15/2015  . Hyperlipemia   . Hypertension   . Left sided lacunar stroke [facility])    1st stroke in 2009 2nd stroke July 2016: Acute lacunar type infarct extending from the left corona radiata through the left external capsule Thought to be due to small vessel disease     . OSA on CPAP   . Pneumonia ~ 2013 & 2014 X 2  . Shortness of breath dyspnea   . Syncope 07/2016    Past Surgical History:  Procedure Laterality Date  . APPENDECTOMY  1980's  . BOWEL RESECTION N/A 02/06/2015   Procedure: SMALL BOWEL RESECTION;  Surgeon: Michael Boston,  MD;  Location: [facility];  Service: General;  Laterality: N/A;  . CARDIAC CATHETERIZATION  2016  . CAROTID ENDARTERECTOMY Left 11/2007   mptes 6132-11  . HERNIA REPAIR    . KNEE ARTHROSCOPY Left X 2  . LAPAROSCOPIC ASSISTED VENTRAL HERNIA REPAIR  02/06/2015  . LAPAROSCOPIC SMALL BOWEL RESECTION  02/06/2015  . LAPAROSCOPY N/A 02/06/2015   Procedure: LAPAROSCOPY DIAGNOSTIC;  Surgeon: Michael Boston, MD;  Location: [facility];  Service: General;  Laterality: N/A;     Allergies  Allergies  Allergen Reactions  . Wellbutrin [Bupropion] Other (See  Comments)    Makes people more nervous and anger    Inpatient Medications    . amitriptyline  100 mg Oral QHS  . aspirin EC  81 mg Oral Daily  . atorvastatin  40 mg Oral q1800  . busPIRone  7.5 mg Oral BID  . citalopram  40 mg Oral Daily  . enoxaparin (LOVENOX) injection  40 mg Subcutaneous Daily  . insulin aspart  0-9 Units Subcutaneous TID WC  . nicotine  14 mg Transdermal Daily  . pregabalin  150 mg Oral BID  . sodium chloride flush  3 mL Intravenous Q12H    Family History    Family History  Problem Relation Age of Onset  . Stroke Father   . Hypertension Father   . Diabetes Father   . Heart failure Father   . Colon polyps Father   . Heart attack Father        30's  . Hypertension Mother   . Diabetes Mother   . Heart failure Mother   . Colon polyps Mother   . Arrhythmia Mother        Atrial Fibrillation  . Hypertension Sister   . Hypertension Brother   . Diabetes Sister   . Diabetes Brother   . Colon cancer Neg Hx     Social History    Social History   Social History  . Marital status: Married    Spouse name: N/A  . Number of children: 1  . Years of education: 94   Occupational History  . N/A    Social History Main Topics  . Smoking status: Current Some Day Smoker    Packs/day: 1.00    Years: 30.00    Types: Cigarettes  . Smokeless tobacco: Never Used  . Alcohol use No  . Drug use: No  . Sexual activity: No   Other Topics Concern  . 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  Cardiovascular:  No chest pain, dyspnea on exertion, edema, orthopnea, palpitations, paroxysmal nocturnal dyspnea. Dermatological: No rash, lesions/masses    Respiratory: No cough, dyspnea Urologic: No hematuria, dysuria Abdominal:   No nausea, vomiting, diarrhea, bright red blood per rectum, melena, or hematemesis Neurologic:  No visual changes, wkns, changes in mental status. Positive for syncope.   All other systems reviewed and are otherwise negative except as noted above.  Physical Exam    Blood pressure (!) 141/80, pulse 85, temperature 97.8 F (36.6 C), temperature source Oral, resp. rate 20, height 6' (1.829 m),  weight 299 lb 8 oz (135.9 kg), SpO2 94 %.  General: Pleasant, obese Caucasian male appearing in NAD Psych: Normal affect. Neuro: Alert and oriented X 3. Moves all extremities spontaneously. HEENT: Normal  Neck: Supple without bruits. JVD difficult to assess secondary to body habitus. Lungs:  Resp regular and unlabored, CTA without wheezing or rales. Heart: RRR no s3, s4, or murmurs. Abdomen: Soft, non-tender, non-distended, BS + x 4.  Extremities: No clubbing, cyanosis or edema. DP/PT/Radials 2+ and equal bilaterally.  Labs    Troponin (Point of Care Test) No results for input(s): TROPIPOC in the last 72 hours.  Recent Labs  08/14/16 0424  TROPONINI <0.03   Lab Results  Component Value Date   WBC 8.6 08/14/2016   HGB 14.5 08/14/2016   HCT 45.0 08/14/2016   MCV 93.9 08/14/2016   PLT 171 08/14/2016     Recent Labs Lab 08/14/16 0424  NA 139  K 4.6  CL 107  CO2 25  BUN 30*  CREATININE 1.93*  CALCIUM 9.0  GLUCOSE 136*   Lab Results  Component Value Date   CHOL 203 (H) 09/04/2014   HDL 31 (L) 09/04/2014   LDLCALC 151 (H) 09/04/2014   TRIG 106 09/04/2014   Lab Results  Component Value Date   DDIMER  08/01/2009    0.42        AT THE INHOUSE ESTABLISHED CUTOFF VALUE OF 0.48 ug/mL FEU, THIS ASSAY HAS BEEN DOCUMENTED IN THE LITERATURE TO HAVE A SENSITIVITY AND NEGATIVE PREDICTIVE VALUE OF AT LEAST 98 TO 99%.  THE TEST RESULT SHOULD BE CORRELATED WITH AN ASSESSMENT OF THE CLINICAL PROBABILITY  OF DVT / VTE.     Radiology Studies    Ct Head Wo Contrast  Result Date: 08/14/2016 CLINICAL DATA:  61 y/o M; syncope with head injury. History of CVA and carotid endarterectomy. EXAM: CT HEAD WITHOUT CONTRAST CT CERVICAL SPINE WITHOUT CONTRAST TECHNIQUE: Multidetector CT imaging of the head and cervical spine was performed following the standard protocol without intravenous contrast. Multiplanar CT image reconstructions of the cervical spine were also generated. COMPARISON:  09/05/2014 MRI of the head.  09/03/2014 CT of the head. FINDINGS: CT HEAD FINDINGS Brain: No large territory acute infarct, intracranial hemorrhage, or focal mass effect identified. Chronic infarcts are present within the left lentiform nucleus and periventricular white matter of the left frontal lobe. There is a new lucency within the right caudate head compatible with age indeterminate lacunar infarct. Ex vacuo dilatation of the left lateral ventricle. Vascular: Mild calcific atherosclerosis of the carotid siphons. Skull: Soft tissue swelling in the right parietal scalp compatible with small contusion. No displaced calvarial fracture. Sinuses/Orbits: No acute finding. Other: None. CT CERVICAL SPINE FINDINGS Alignment: Normal. Skull base and vertebrae: No acute fracture. No primary bone lesion or focal pathologic process. Soft tissues and spinal canal: No prevertebral fluid or swelling. No visible canal hematoma. Disc levels: Mild cervical spondylosis with predominantly discogenic degenerative changes from the C4 through C6 levels. Upper chest: Negative. Other: Mild calcific atherosclerosis of carotid bifurcation. Postsurgical changes related to left carotid endarterectomy. IMPRESSION: 1. Small right parietal region scalp contusion. No displaced calvarial fracture. 2. Age-indeterminate lacunar infarct of the right caudate head. 3. Chronic infarcts in left lentiform nucleus and periventricular white matter of left frontal lobe. 4. No  acute fracture or dislocation of the cervical spine. Electronically Signed   By: Kristine Garbe M.D.   On: 08/14/2016 06:01   Ct Cervical Spine Wo Contrast  Result  Date: 08/14/2016 CLINICAL DATA:  61 y/o M; syncope with head injury. History of CVA and carotid endarterectomy. EXAM: CT HEAD WITHOUT CONTRAST CT CERVICAL SPINE WITHOUT CONTRAST TECHNIQUE: Multidetector CT imaging of the head and cervical spine was performed following the standard protocol without intravenous contrast. Multiplanar CT image reconstructions of the cervical spine were also generated. COMPARISON:  09/05/2014 MRI of the head.  09/03/2014 CT of the head. FINDINGS: CT HEAD FINDINGS Brain: No large territory acute infarct, intracranial hemorrhage, or focal mass effect identified. Chronic infarcts are present within the left lentiform nucleus and periventricular white matter of the left frontal lobe. There is a new lucency within the right caudate head compatible with age indeterminate lacunar infarct. Ex vacuo dilatation of the left lateral ventricle. Vascular: Mild calcific atherosclerosis of the carotid siphons. Skull: Soft tissue swelling in the right parietal scalp compatible with small contusion. No displaced calvarial fracture. Sinuses/Orbits: No acute finding. Other: None. CT CERVICAL SPINE FINDINGS Alignment: Normal. Skull base and vertebrae: No acute fracture. No primary bone lesion or focal pathologic process. Soft tissues and spinal canal: No prevertebral fluid or swelling. No visible canal hematoma. Disc levels: Mild cervical spondylosis with predominantly discogenic degenerative changes from the C4 through C6 levels. Upper chest: Negative. Other: Mild calcific atherosclerosis of carotid bifurcation. Postsurgical changes related to left carotid endarterectomy. IMPRESSION: 1. Small right parietal region scalp contusion. No displaced calvarial fracture. 2. Age-indeterminate lacunar infarct of the right caudate head. 3.  Chronic infarcts in left lentiform nucleus and periventricular white matter of left frontal lobe. 4. No acute fracture or dislocation of the cervical spine. Electronically Signed   By: Kristine Garbe M.D.   On: 08/14/2016 06:01    EKG & Cardiac Imaging    EKG:  NSR, HR 64, with 1st degree AV Block.  - Personally Reviewed  Echocardiogram: 08/2014 Study Conclusions  - Left ventricle: The cavity size was normal. Wall thickness was   increased in a pattern of mild LVH. Systolic function was normal.   The estimated ejection fraction was in the range of 60% to 65%.   Wall motion was normal; there were no regional wall motion   abnormalities. Doppler parameters are consistent with abnormal   left ventricular relaxation (grade 1 diastolic dysfunction). The   E/e&' ratio is >15, suggesting elevated LV filling pressure. - Aortic valve: Trileaflet. The left coronary cusp appears to be   immobile. There was no stenosis. There was no regurgitation. - Mitral valve: Calcified annulus. Calcified chordae. Trivial MR. - Left atrium: Moderately dilated at 42 ml/m2. - Right atrium: Mildly dilated at 22 cm2. - Inferior vena cava: The vessel was normal in size. The   respirophasic diameter changes were in the normal range (>= 50%),   consistent with normal central venous pressure.  Impressions:  - LVEF 60-65%, mild LVH, diastolic dysfunction, elevated LV filling   pressure, immobile left coronary cusp of the aortic valve   (without significant stenosis), MAC with trivial MR, moderate   LAE, mild RAE, normal IVC.  Assessment & Plan    1. Syncope - he experienced two syncopal episodes earlier this morning, the first occurring when getting out of bed and the second one while using the restroom. LOC was < 1 minute with each episode. No bowel or bladder incontinence reported. No seizure-like activity witnessed. He denies any associated chest discomfort, palpitations, dyspnea, diaphoresis,  nausea, or vomiting.  - No recent diet changes, medication changes, or recent illnesses. No history of syncopal events  or prior cardiac history. - initial labs show WBC of 8.6, Hgb 14.5, platelets 171. Na+ 139, K+ 4.6, creatinine 1.93 (increased from 1.75 in 03/2016). Initial troponin negative. Orthostatics normal. CT Head shows a small right parietal scalp contusion with age-indeterminate lacunar infarct with chronic infarcts in the left lentiform nucleus and periventricular white matter of the left frontal lobe.  - on telemetry, he has maintained NSR with HR in the 60's - 80's with occasional sinus pauses (up to 2 seconds).  - agree with obtaining an echocardiogram to assess for any structural abnormalities. With his sinus pauses, would avoid the use of AV nodal blocking agents. Recommend a cardiac event monitor at the time of discharge to assess for significant pauses or arrhythmias.   2. HTN - BP at 103/59 - 141/80 since admission. - Lisinopril currently held in the setting of his AKI. Consider starting Amlodipine 26m daily in place of this.   3. HLD - remains on Atorvastatin 464mdaily.   4. Acute on Chronic Stage 3 CKD - creatinine at 1.75 in 03/2016, elevated to 1.93 on admission. - receiving IVF at 100 mL/hr. ACE-I held.   5. Prior CVA - occurring in 2009 and 2016 with residual right-sided weakness.  - remains on ASA and statin therapy  6. Tobacco Use - has a 35+ pack-year history. - cessation advised.   7. OSA - reports noncompliance with this due to "not liking to wear it". Compliance was strongly encouraged.   Signed, BrErma HeritagePA-C 08/14/2016, 3:39 PM Pager: [phone]

## 2016-08-14 NOTE — ED Triage Notes (Signed)
Pt from home by EMS c/o syncopal episodes x2. Pt got up to go to the bathroom and fell in the bathroom. Pt hit his head on the wall. Hx of CVA with R sided deficits, per wife, pt's deficits are at baseline. Pt orthostatic with EMS: sit bp 110/74, pulse 78; standing bp85/43 pulse 98. EMS gave 544ml NS en route

## 2016-08-14 NOTE — Progress Notes (Signed)
*  PRELIMINARY RESULTS* Echocardiogram 2D Echocardiogram has been performed.  [name] 08/14/2016, 4:18 PM

## 2016-08-15 ENCOUNTER — Telehealth: Payer: Self-pay

## 2016-08-15 ENCOUNTER — Observation Stay (HOSPITAL_BASED_OUTPATIENT_CLINIC_OR_DEPARTMENT_OTHER): Payer: BLUE CROSS/BLUE SHIELD

## 2016-08-15 ENCOUNTER — Other Ambulatory Visit: Payer: Self-pay | Admitting: Student

## 2016-08-15 DIAGNOSIS — I491 Atrial premature depolarization: Secondary | ICD-10-CM

## 2016-08-15 DIAGNOSIS — I10 Essential (primary) hypertension: Secondary | ICD-10-CM

## 2016-08-15 DIAGNOSIS — R55 Syncope and collapse: Secondary | ICD-10-CM

## 2016-08-15 DIAGNOSIS — I129 Hypertensive chronic kidney disease with stage 1 through stage 4 chronic kidney disease, or unspecified chronic kidney disease: Secondary | ICD-10-CM | POA: Diagnosis not present

## 2016-08-15 DIAGNOSIS — E1140 Type 2 diabetes mellitus with diabetic neuropathy, unspecified: Secondary | ICD-10-CM | POA: Diagnosis not present

## 2016-08-15 DIAGNOSIS — I441 Atrioventricular block, second degree: Secondary | ICD-10-CM

## 2016-08-15 DIAGNOSIS — E7800 Pure hypercholesterolemia, unspecified: Secondary | ICD-10-CM | POA: Diagnosis not present

## 2016-08-15 LAB — VAS US CAROTID
LCCADDIAS: -11 cm/s
LCCADSYS: -69 cm/s
LEFT ECA DIAS: -10 cm/s
LICADDIAS: -26 cm/s
LICAPDIAS: -13 cm/s
Left CCA prox dias: 12 cm/s
Left CCA prox sys: 97 cm/s
Left ICA dist sys: -81 cm/s
Left ICA prox sys: -43 cm/s
RCCAPDIAS: 5 cm/s
RCCAPSYS: 61 cm/s
RIGHT ECA DIAS: -11 cm/s
RIGHT VERTEBRAL DIAS: -12 cm/s
Right cca dist sys: -67 cm/s

## 2016-08-15 LAB — BASIC METABOLIC PANEL
Anion gap: 5 (ref 5–15)
BUN: 23 mg/dL — AB (ref 6–20)
CO2: 26 mmol/L (ref 22–32)
CREATININE: 1.66 mg/dL — AB (ref 0.61–1.24)
Calcium: 9.1 mg/dL (ref 8.9–10.3)
Chloride: 108 mmol/L (ref 101–111)
GFR, EST AFRICAN AMERICAN: 51 mL/min — AB (ref >60)
GFR, EST NON AFRICAN AMERICAN: 44 mL/min — AB (ref >60)
Glucose, Bld: 88 mg/dL (ref 65–99)
Potassium: 5.2 mmol/L — ABNORMAL HIGH (ref 3.5–5.1)
SODIUM: 139 mmol/L (ref 135–145)

## 2016-08-15 LAB — GLUCOSE, CAPILLARY
GLUCOSE-CAPILLARY: 154 mg/dL — AB (ref 65–99)
Glucose-Capillary: 82 mg/dL (ref 65–99)

## 2016-08-15 LAB — HIV ANTIBODY (ROUTINE TESTING W REFLEX): HIV SCREEN 4TH GENERATION: NONREACTIVE

## 2016-08-15 MED ORDER — LISINOPRIL 10 MG PO TABS
30.0000 mg | ORAL_TABLET | Freq: Every day | ORAL | Status: DC
  Administered 2016-08-15: 30 mg via ORAL
  Filled 2016-08-15: qty 3

## 2016-08-15 NOTE — Progress Notes (Signed)
Pt discharged home with wife. IV and telemetry box removed. Pt and pt's wife received discharge instructions and all questions were answered. Pt left with all of his belongings. Pt left the unit via wheelchair and was accompanied by this RN.  Grant Fontana BSN, RN

## 2016-08-15 NOTE — Discharge Summary (Signed)
Name: [name] MRN: [id number] DOB: 20-Feb-1955 61 y.o. PCP: [name], MD  Date of Admission: 08/14/2016  4:09 AM Date of Discharge: 08/15/2016 Attending Physician: Lucious Groves, DO  Discharge Diagnosis: 1. 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   aspirin EC 81 MG tablet Take 1 tablet (81 mg total) by mouth daily.   atorvastatin 40 MG tablet Commonly known as:  LIPITOR Take 1 tablet (40 mg total) by mouth daily at 6 PM.   busPIRone 7.5 MG tablet Commonly known as:  BUSPAR TAKE ONE TABLET BY MOUTH TWICE DAILY What changed:  See the new instructions.   citalopram 40 MG tablet Commonly known as:  CELEXA Take 1 tablet (40 mg total) by mouth daily.   lisinopril 30 MG tablet Commonly known as:  PRINIVIL,ZESTRIL TAKE ONE TABLET BY MOUTH ONCE DAILY What changed:  See the new instructions.   pregabalin 150 MG capsule Commonly known as:  LYRICA Take 1 capsule (150 mg total) by mouth 2 (two) times daily.       Disposition and follow-up:   ChadDarl F Munoz was discharged from [facility] in Stable condition.  At the hospital follow up visit please address:  1.    Syncope: --has patient followed up with cardiology for event monitor? --has he had further episodes of syncope? --encourage him not to take meds not prescribed to him or other illicit drugs (UDS +benzo, opioids, cocaine, thc)  Neuropathic pain: --please confirm what patient is taking for his pain --adjust therapy if further control is needed  Left vertebral artery stenosis: --found incidentally on carotid u/s --consider referral to vascular surgery if patient continues to have syncopal episodes, TIA/CVA symptoms  Anxiety: --assess for willingness to participate in counseling for further management of his anxiety in addition to his celexa and buspar  CKD: --Cr 1.66 at discharge, which is his baseline.  --K 5.2 on discharge - consider repeating Bmet at f/u  2.  Labs / imaging needed at time of follow-up: consider repeating Bmet for K (5.2 on discharge)  3.  Pending labs/ test needing follow-up: none  Follow-up Appointments: Follow-up Information    Skeet Latch, MD Follow up on 10/22/2016.   Specialty:  Cardiology Why:  Cardiology Follow-Up on 10/22/2016 at 9:40AM. The office will call you within the next 48 business hours to schedule your 30-day cardiac event monitor.  Contact information: [address] [place] [address] [phone]        [name], MD. Go on 08/22/2016.   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 Per EMS report, patient was apparently orthostatic and given small bolus en route. On arrival, patient was afebrile, with stable vital signs and normal orthostatic vital signs as well. Initial labs were unremarkable; CT head showed a small right parietal scalp contusion without fracture, and no acute infarcts or bleeds. CT cervical spine was negative for acute fracture or dislocation. EKG was not indicative of acute ischemia or arrhythmia, troponin was negative. Patient denied pre-syncopal symptoms. Patient was placed on telemetry and was found to have occasional PACs and Mobitz 1 second degree AV block interpreted by cardiology - he was asymptomatic during these episodes. Echo showed normal systolic function with V6HY and mild LVH. Carotid dopplers show 1-39% stenosis bilaterally and left vertebral artery high grade stenosis at level of subclavian. UDS was remarkable for benzodiazepines, opioids, cocaine and THC - patient admitted to taking all but cocaine night of syncopal episodes. We discussed at length the negative aspects of each of these drugs alone and in combination with added risk of his comorbidities. Patient was referred for 30 day event monitor and will follow up with cardiology.  HTN: Patient with normal BP on admission and mild increase in Cr, which normalized on discharge. He was restarted on his home lisinopril at discharge.   Chronic renal dysfunction: Patient had mild increase in Cr to 1.9 on admission, which returned to baseline of 1.66 on discharge with mild fluids.   T2DM with neuropathy:  A1c this admission was 5.8. Glipizide was restarted at discharge. Patient was continued on home Lyrica 150mg  BID and amitriptyline 100mg  qhs for neuropathic pain. Please assess compliance with above regimen and if neuropathic pain not controlled, adjust accordingly as  patient endorsed taking opioids for leg pain.  Depression/Anxiety: Patient was continued on home celexa and buspar. Per patient, he had taken wife's benzodiazepine for anxiety - please consider referral to counseling.   Discharge Vitals:   BP (!) 153/68 (BP Location: Right Arm)   Pulse 65   Temp 98.1 F (36.7 C) (Oral)   Resp 18   Ht 6' (1.829 m)   Wt (!) 300 lb 3.2 oz (136.2 kg)   SpO2 97%   BMI 40.71 kg/m   Pertinent Labs, Studies, and Procedures:  CBC Latest Ref Rng & Units 08/14/2016 02/08/2015 02/07/2015  WBC 4.0 - 10.5 K/uL 8.6 - 12.1(H)  Hemoglobin 13.0 - 17.0 g/dL 14.5 11.6(L) 12.4(L)  Hematocrit 39.0 - 52.0 % 45.0 - 38.3(L)  Platelets 150 - 400 K/uL 171 - 151   BMP Latest Ref Rng & Units 08/15/2016 08/14/2016 03/26/2016  Glucose 65 - 99 mg/dL 88 136(H) 99  BUN 6 - 20 mg/dL 23(H) 30(H) 24  Creatinine 0.61 - 1.24 mg/dL 1.66(H) 1.93(H) 1.75(H)  BUN/Creat Ratio 9 - 20 - - 14  Sodium 135 - 145 mmol/L 139 139 141  Potassium 3.5 - 5.1 mmol/L 5.2(H) 4.6 5.1  Chloride 101 - 111 mmol/L 108 107 103  CO2 22 - 32 mmol/L 26 25 20   Calcium 8.9 - 10.3 mg/dL 9.1 9.0 9.6   HgbA1c 08/15/2016: 5.8 HIV screen 08/15/2016: nonreactive  CT head wo contrast 08/14/2016: 1. Small right parietal region scalp contusion. No displaced calvarial fracture. 2. Age-indeterminate lacunar infarct of the right caudate head. 3. Chronic infarcts in left lentiform nucleus and periventricular white matter of left frontal lobe  CT cervical spine wo contrast 08/14/2016: No acute fracture or dislocation of the cervical spine.  ECHO 08/14/2016: - Normal LV size with mild LV hypertrophy. Vigorous systolic function, EF 90-38%. Moderate diastolic dysfunction. Normal RV size and systolic function. Aortic valve sclerosis without significant stenosis  Carotid dopplers 08/15/2016: -Findings are consistent with a 1-39 percent stenosis involving the right internal carotid artery and the left internal carotid artery. -The  right vertebral artery demonstrates antegrade flow. The left vertebral artery demonstrates a bidirectional waveform suggestive of a high grade stenosis at the level of the subclavian  Discharge Instructions: Discharge Instructions    Call MD for:  difficulty breathing, headache or visual disturbances    Complete by:  As directed    Call MD for:  extreme fatigue    Complete by:  As directed    Call MD for:  hives    Complete by:  As directed    Call MD for:  persistant dizziness or light-headedness    Complete by:  As directed    Call MD for:  persistant nausea and vomiting    Complete by:  As directed    Call MD for:  redness, tenderness, or signs of infection (pain, swelling, redness, odor or green/yellow discharge around incision site)    Complete by:  As directed    Call MD for:  severe uncontrolled pain    Complete by:  As directed    Call MD for:  temperature >100.4    Complete by:  As directed    Diet - low sodium heart healthy    Complete by:  As directed    Discharge instructions    Complete by:  As directed    You were seen in the hospital for an episode of syncope (passing out).   The cardiologist would like to have you wear a 30 day heart monitor to check for any difference in rhythm that may have caused you to pass out and also to look at degree of your dropped heart beats that we've seen here while you're in the hospital.   Please follow up with cardiology per their instruction attached to this paperwork.  Also please follow up with [place] in the clinic next week; we'll continue working on controlling your blood pressure and managing your neuropathy. Please take only the medicines prescribed to you.   Increase activity slowly    Complete by:  As directed       Signed: Alphonzo Grieve, MD 08/15/2016, 4:19 PM   Pager [phone]

## 2016-08-15 NOTE — Evaluation (Signed)
Physical Therapy Evaluation Patient Details Name: [name] MRN: [id number] DOB: 10/23/1955 Today's Date: 08/15/2016   History of Present Illness  Pt adm with syncope. PMH - CVA, DM, obesity, neuropathy, htn  Clinical Impression  Pt doing well with mobility and no further PT needed.  Ready for dc from PT standpoint.      Follow Up Recommendations No PT follow up    Equipment Recommendations  None recommended by PT    Recommendations for Other Services       Precautions / Restrictions Precautions Precautions: None      Mobility  Bed Mobility Overal bed mobility: Modified Independent             General bed mobility comments: Incr time and effort  Transfers Overall transfer level: Modified independent               General transfer comment: Incr time  Ambulation/Gait Ambulation/Gait assistance: Independent Ambulation Distance (Feet): 450 Feet Assistive device: None Gait Pattern/deviations: Step-through pattern;Wide base of support Gait velocity: decr Gait velocity interpretation: Below normal speed for age/gender General Gait Details: slow, steady gait  Stairs            Wheelchair Mobility    Modified Rankin (Stroke Patients Only)       Balance Overall balance assessment: No apparent balance deficits (not formally assessed)                                           Pertinent Vitals/Pain Pain Assessment: No/denies pain    Home Living Family/patient expects to be discharged to:: Private residence Living Arrangements: Spouse/significant other Available Help at Discharge: Family;Available 24 hours/day Type of Home: House Home Access: Stairs to enter Entrance Stairs-Rails: None Entrance Stairs-Number of Steps: 2 Home Layout: One level Home Equipment: Walker - 2 wheels;Walker - 4 wheels;Cane - single point;Shower seat      Prior Function Level of Independence: Independent               Hand  Dominance   Dominant Hand: Right    Extremity/Trunk Assessment   Upper Extremity Assessment Upper Extremity Assessment: Overall WFL for tasks assessed    Lower Extremity Assessment Lower Extremity Assessment: Overall WFL for tasks assessed       Communication   Communication: No difficulties  Cognition Arousal/Alertness: Awake/alert Behavior During Therapy: Flat affect Overall Cognitive Status: Within Functional Limits for tasks assessed                                        General Comments      Exercises     Assessment/Plan    PT Assessment Patent does not need any further PT services  PT Problem List         PT Treatment Interventions      PT Goals (Current goals can be found in the Care Plan section)  Acute Rehab PT Goals PT Goal Formulation: All assessment and education complete, DC therapy    Frequency     Barriers to discharge        Co-evaluation               AM-PAC PT "6 Clicks" Daily Activity  Outcome Measure Difficulty turning over in bed (including adjusting bedclothes, sheets and blankets)?: None  Difficulty moving from lying on back to sitting on the side of the bed? : A Little Difficulty sitting down on and standing up from a chair with arms (e.g., wheelchair, bedside commode, etc,.)?: None Help needed moving to and from a bed to chair (including a wheelchair)?: None Help needed walking in hospital room?: None Help needed climbing 3-5 steps with a railing? : None 6 Click Score: 23    End of Session   Activity Tolerance: Patient tolerated treatment well Patient left: in bed;with call bell/phone within reach Nurse Communication: Mobility status PT Visit Diagnosis: Difficulty in walking, not elsewhere classified (R26.2)    Time: 3662-9476 PT Time Calculation (min) (ACUTE ONLY): 8 min   Charges:   PT Evaluation $PT Eval Low Complexity: 1 Procedure     PT G Codes:   PT G-Codes **NOT FOR INPATIENT  CLASS** Functional Assessment Tool Used: AM-PAC 6 Clicks Basic Mobility Functional Limitation: Mobility: Walking and moving around Mobility: Walking and Moving Around Current Status (L4650): At least 1 percent but less than 20 percent impaired, limited or restricted Mobility: Walking and Moving Around Goal Status [phone]): At least 1 percent but less than 20 percent impaired, limited or restricted Mobility: Walking and Moving Around Discharge Status [phone]): At least 1 percent but less than 20 percent impaired, limited or restricted    [facility] PT [place] 08/15/2016, 11:13 AM

## 2016-08-15 NOTE — Progress Notes (Signed)
   Subjective:  Patient states he feels at his baseline this morning. He denies chest pain, shortness of breath, headache, vision or hearing changes, dizziness. He states that prior to episodes of syncope early this morning, he had not had any presyncopal symptoms; he denies recent illness, new medications, or change in eating habits.   He has not yet gotten out of bed yet since coming to the hospital.  Objective:  Vital signs in last 24 hours: Vitals:   08/14/16 1300 08/14/16 1940 08/15/16 0030 08/15/16 0702  BP: (!) 141/80 (!) 152/71 139/73   Pulse: 85 72 65   Resp: 20 18 18    Temp: 97.8 F (36.6 C) 97.9 F (36.6 C) 98.1 F (36.7 C)   TempSrc: Oral Oral Oral   SpO2: 94% 98% 97%   Weight:    (!) 300 lb 3.2 oz (136.2 kg)  Height:       Constitutional: NAD, lying comfortably in bed CV: very distant heart sounds, but no appreciable murmur, rubs or gallops; pulse regular; no LE edema Resp: CTAB, no increased work of breathing, no wheezing Neuro: CN 2-12 grossly intact, moving all extremities, strength intact  Assessment/Plan:  Principal Problem:   Syncope Active Problems:   Controlled type 2 diabetes mellitus with diabetic neuropathy (HCC)   Hyperlipidemia   OSA (obstructive sleep apnea)   Depression   Pulmonary artery hypertension (HCC)   Essential hypertension   Tobacco use disorder   Primary malignant neuroendocrine neoplasm of ileum pT3, 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 Echo yesterday showed LVEF 65-70%, G2DD, mild LVH. EKG with 1st degree block, telemetry continues to show occasional ~2sec dropped beats. UDS obtained on admission was positive for benzodiazepines, opioids, THC, and cocaine - patient admits to benzo, opioid and THC use however denies cocaine. Cardiology  recommends carotid dopplers, and 30d event monitor at discharge. --cards consulted - appreciate their input --f/u carotid dopplers  --30d event monitor at discharge --advised on cessation of illicit substances; will work outpatient to better control his neuropathy --likely discharge later today  Chronic renal dysfunction: Cr down to 1.66 this morning after IVF which is consistent with previous values.   HTN: Patient continues to be normotensive. --restart home lisinopril  HLD: Continue home lipitor  T2DM - diet controlled: --f/u A1c --SSI-S --restart home glipizide at discharge --Lyrica 150mg  BID and amitriptyline 100mg  qhs for neuropathy  Depression/Anxiety: Continue home celexa and buspar.  Dispo: Anticipated discharge likely today after carotid dopplers.   Alphonzo Grieve, MD 08/15/2016, 11:15 AM Pager [phone]

## 2016-08-15 NOTE — Progress Notes (Addendum)
**  Preliminary report by tech**  Carotid artery duplex complete. Findings are consistent with a 1-39 percent stenosis involving the right internal carotid artery and the left internal carotid artery.  The right vertebral artery demonstrates antegrade flow. The left vertebral artery demonstrates a bidirectional waveform suggestive of a high grade stenosis at the level of the subclavian.  08/15/16 2:12 PM [name] RVT

## 2016-08-15 NOTE — Progress Notes (Signed)
Progress Note  Patient Name: [name] Date of Encounter: 08/15/2016  Primary Cardiologist: Dr. Oval Linsey  Subjective   Denies any chest pain, palpitations, or dyspnea. Ambulated without lightheadedness, dizziness, or presyncope.   Inpatient Medications    Scheduled Meds: . amitriptyline  100 mg Oral QHS  . aspirin EC  81 mg Oral Daily  . atorvastatin  40 mg Oral q1800  . busPIRone  7.5 mg Oral BID  . citalopram  40 mg Oral Daily  . enoxaparin (LOVENOX) injection  40 mg Subcutaneous Daily  . insulin aspart  0-9 Units Subcutaneous TID WC  . nicotine  14 mg Transdermal Daily  . pregabalin  150 mg Oral BID  . sodium chloride flush  3 mL Intravenous Q12H   Continuous Infusions:  PRN Meds: acetaminophen **OR** acetaminophen   Vital Signs    Vitals:   08/14/16 1300 08/14/16 1940 08/15/16 0030 08/15/16 0702  BP: (!) 141/80 (!) 152/71 139/73   Pulse: 85 72 65   Resp: 20 18 18    Temp: 97.8 F (36.6 C) 97.9 F (36.6 C) 98.1 F (36.7 C)   TempSrc: Oral Oral Oral   SpO2: 94% 98% 97%   Weight:    (!) 300 lb 3.2 oz (136.2 kg)  Height:        Intake/Output Summary (Last 24 hours) at 08/15/16 1020 Last data filed at 08/14/16 1851  Gross per 24 hour  Intake             1785 ml  Output                0 ml  Net             1785 ml   Filed Weights   08/14/16 0412 08/14/16 0623 08/15/16 0702  Weight: 275 lb (124.7 kg) 299 lb 8 oz (135.9 kg) (!) 300 lb 3.2 oz (136.2 kg)    Telemetry    Sinus rhythm.  Occasional pauses with the longest being 2.5 seconds.  Mobitz I noted.  Blocked PACs noted. - Personally Reviewed  ECG    No new tracings.   Physical Exam   General: Well developed, overweight Caucasian male appearing in no acute distress. Head: Normocephalic, atraumatic.  Neck: Supple without bruits, JVD not elevated. Lungs:  Resp regular and unlabored, CTA without wheezing or rales. Heart: RRR, S1, S2, no S3, S4, or murmur; no rub. Abdomen: Soft,  non-tender, non-distended with normoactive bowel sounds. No hepatomegaly. No rebound/guarding. No obvious abdominal masses. Extremities: No clubbing, cyanosis, or lower extremity edema. Distal pedal pulses are 2+ bilaterally. Neuro: Alert and oriented X 3. Moves all extremities spontaneously. Psych: Normal affect.  Labs    Chemistry Recent Labs Lab 08/14/16 0424 08/15/16 0306  NA 139 139  K 4.6 5.2*  CL 107 108  CO2 25 26  GLUCOSE 136* 88  BUN 30* 23*  CREATININE 1.93* 1.66*  CALCIUM 9.0 9.1  GFRNONAA 37* 44*  GFRAA 42* 51*  ANIONGAP 7 5     Hematology Recent Labs Lab 08/14/16 0424  WBC 8.6  RBC 4.79  HGB 14.5  HCT 45.0  MCV 93.9  MCH 30.3  MCHC 32.2  RDW 13.8  PLT 171    Cardiac Enzymes Recent Labs Lab 08/14/16 0424  TROPONINI <0.03   No results for input(s): TROPIPOC in the last 168 hours.   BNPNo results for input(s): BNP, PROBNP in the last 168 hours.   DDimer No results for input(s): DDIMER in the  last 168 hours.   Radiology    Ct Head Wo Contrast  Result Date: 08/14/2016 CLINICAL DATA:  61 y/o M; syncope with head injury. History of CVA and carotid endarterectomy. EXAM: CT HEAD WITHOUT CONTRAST CT CERVICAL SPINE WITHOUT CONTRAST TECHNIQUE: Multidetector CT imaging of the head and cervical spine was performed following the standard protocol without intravenous contrast. Multiplanar CT image reconstructions of the cervical spine were also generated. COMPARISON:  09/05/2014 MRI of the head.  09/03/2014 CT of the head. FINDINGS: CT HEAD FINDINGS Brain: No large territory acute infarct, intracranial hemorrhage, or focal mass effect identified. Chronic infarcts are present within the left lentiform nucleus and periventricular white matter of the left frontal lobe. 59 is a new lucency within the right caudate head compatible with 59 indeterminate lacunar infarct. Ex vacuo dilatation of the left lateral ventricle. Vascular: Mild calcific atherosclerosis of the  carotid siphons. Skull: Soft tissue swelling in the right parietal scalp compatible with small contusion. No displaced calvarial fracture. Sinuses/Orbits: No acute finding. Other: None. CT CERVICAL SPINE FINDINGS Alignment: Normal. Skull base and vertebrae: No acute fracture. No primary bone lesion or focal pathologic process. Soft tissues and spinal canal: No prevertebral fluid or swelling. No visible canal hematoma. Disc levels: Mild cervical spondylosis with predominantly discogenic degenerative changes from the C4 through C6 levels. Upper chest: Negative. Other: Mild calcific atherosclerosis of carotid bifurcation. Postsurgical changes related to left carotid endarterectomy. IMPRESSION: 1. Small right parietal region scalp contusion. No displaced calvarial fracture. 2. Age-indeterminate lacunar infarct of the right caudate head. 3. Chronic infarcts in left lentiform nucleus and periventricular white matter of left frontal lobe. 4. No acute fracture or dislocation of the cervical spine. Electronically Signed   By: Kristine Garbe M.D.   On: 08/14/2016 06:01   Ct Cervical Spine Wo Contrast  Result Date: 08/14/2016 CLINICAL DATA:  61 y/o M; syncope with head injury. History of CVA and carotid endarterectomy. EXAM: CT HEAD WITHOUT CONTRAST CT CERVICAL SPINE WITHOUT CONTRAST TECHNIQUE: Multidetector CT imaging of the head and cervical spine was performed following the standard protocol without intravenous contrast. Multiplanar CT image reconstructions of the cervical spine were also generated. COMPARISON:  09/05/2014 MRI of the head.  09/03/2014 CT of the head. FINDINGS: CT HEAD FINDINGS Brain: No large territory acute infarct, intracranial hemorrhage, or focal mass effect identified. Chronic infarcts are present within the left lentiform nucleus and periventricular white matter of the left frontal lobe. 59 is a new lucency within the right caudate head compatible with age indeterminate lacunar  infarct. Ex vacuo dilatation of the left lateral ventricle. Vascular: Mild calcific atherosclerosis of the carotid siphons. Skull: Soft tissue swelling in the right parietal scalp compatible with small contusion. No displaced calvarial fracture. Sinuses/Orbits: No acute finding. Other: None. CT CERVICAL SPINE FINDINGS Alignment: Normal. Skull base and vertebrae: No acute fracture. No primary bone lesion or focal pathologic process. Soft tissues and spinal canal: No prevertebral fluid or swelling. No visible canal hematoma. Disc levels: Mild cervical spondylosis with predominantly discogenic degenerative changes from the C4 through C6 levels. Upper chest: Negative. Other: Mild calcific atherosclerosis of carotid bifurcation. Postsurgical changes related to left carotid endarterectomy. IMPRESSION: 1. Small right parietal region scalp contusion. No displaced calvarial fracture. 2. Age-indeterminate lacunar infarct of the right caudate head. 3. Chronic infarcts in left lentiform nucleus and periventricular white matter of left frontal lobe. 4. No acute fracture or dislocation of the cervical spine. Electronically Signed   By: Edgardo Roys.D.  On: 08/14/2016 06:01    Cardiac Studies   Echocardiogram: 08/14/2016 Study Conclusions  - Left ventricle: The cavity size was normal. Wall thickness was   increased in a pattern of mild LVH. Systolic function was   vigorous. The estimated ejection fraction was in the range of 65%   to 70%. Wall motion was normal; there were no regional wall   motion abnormalities. Features are consistent with a pseudonormal   left ventricular filling pattern, with concomitant abnormal   relaxation and increased filling pressure (grade 2 diastolic   dysfunction). - Aortic valve: Trileaflet; moderately calcified leaflets.   Sclerosis without stenosis. - Mitral valve: There was no significant regurgitation. - Right ventricle: The cavity size was normal. Systolic  function   was normal. - Pulmonary arteries: No complete TR doppler jet so unable to   estimate PA systolic pressure. - Inferior vena cava: The vessel was normal in size. The   respirophasic diameter changes were in the normal range (= 50%),   consistent with normal central venous pressure. - Pericardium, extracardiac: A trivial pericardial effusion was   identified posterior to the heart.  Impressions:  - Normal LV size with mild LV hypertrophy. Vigorous systolic   function, EF 85-63%. Moderate diastolic dysfunction. Normal RV   size and systolic function. Aortic valve sclerosis without   significant stenosis.  Patient Profile     61 y.o. male with PMH of HTN, HLD, Stage 3 CKD, prior CVA (occuring in 2009 and 2016), and continued tobacco use who presented to [facility] ED on 08/14/2016 for evaluation of syncope.    Assessment & Plan    1. Syncope - he experienced two syncopal episodes during the early morning hours of 6/27, the first occurring when getting out of bed and the second one while using the restroom. LOC was < 1 minute with each episode. No bowel or bladder incontinence reported. No seizure-like activity witnessed. He denies any associated chest discomfort, palpitations, dyspnea, diaphoresis, nausea, or vomiting.  - creatinine mildly elevated on admission, now improved. Orthostatics normal. CT Head shows a small right parietal scalp contusion with age-indeterminate lacunar infarct with chronic infarcts in the left lentiform nucleus and periventricular white matter of the left frontal lobe.  - echo shows a preserved EF of 65-70% with Grade 2 DD and aortic sclerosis without stenosis. Carotid dopplers pending.  - telemetry continues to show occasional pauses, with the longest being 2.39 seconds. Recommend a 30-day event monitor at the time of discharge. If significant pauses are noted, will need an EP referral.   2. HTN - BP at 139/71 - 152/80 in the past 24 hours.  -  Lisinopril currently held in the setting of his AKI. Consider starting Amlodipine 5mg  daily in place of this.   3. HLD - remains on Atorvastatin 40mg  daily.   4. Acute on Chronic Stage 3 CKD - creatinine at 1.75 in 03/2016, elevated to 1.93 on admission. Trending down to 1.66 this AM.   5. Prior CVA - occurring in 2009 and 2016 with residual right-sided weakness.  - remains on ASA and statin therapy  6. Tobacco Use - has a 35+ pack-year history. - cessation advised.   7. OSA - compliance with CPAP strongly encouraged.    Arna Medici , PA-C 10:20 AM 08/15/2016 Pager: [phone]  Attending addendum:  History and all data above reviewed.  Patient examined.  I agree with the findings as above.  All available labs, radiology testing, previous records reviewed.  Agree with documented assessment and plan. Mr. Lineman is a 48M with prior stroke, carotid stenosis s/p stenting, hypertension, hyperlipidemia, prior stroke and ongoing tobacco abuse here with two syncopal episodes.  Echo reveals normal systolic function and grade 2 diastolic dysfunction.  Carotid Dopplers are pending.  Telemetry shows blocked PACs and Mobitz I 2nd degree AV block.  The longest pause was 2.5s and he is asymptomatic.  We will plan for outpatient 30 day event monitor.  Stable for discharge.    Providencia Hottenstein C. Oval Linsey, MD, [facility]  08/15/2016 1:12 PM

## 2016-08-15 NOTE — Telephone Encounter (Signed)
Hospital TOC per Dr Jari Favre, discharge 08/15/2016, appt 08/22/2016 @9 :45.

## 2016-08-16 LAB — HEMOGLOBIN A1C
HEMOGLOBIN A1C: 5.8 % — AB (ref 4.8–5.6)
MEAN PLASMA GLUCOSE: 120 mg/dL

## 2016-08-17 DIAGNOSIS — I6502 Occlusion and stenosis of left vertebral artery: Secondary | ICD-10-CM

## 2016-08-19 ENCOUNTER — Ambulatory Visit (INDEPENDENT_AMBULATORY_CARE_PROVIDER_SITE_OTHER): Payer: BLUE CROSS/BLUE SHIELD

## 2016-08-19 DIAGNOSIS — R55 Syncope and collapse: Secondary | ICD-10-CM | POA: Diagnosis not present

## 2016-08-22 ENCOUNTER — Ambulatory Visit (INDEPENDENT_AMBULATORY_CARE_PROVIDER_SITE_OTHER): Payer: BLUE CROSS/BLUE SHIELD | Admitting: Internal Medicine

## 2016-08-22 ENCOUNTER — Encounter: Payer: Self-pay | Admitting: Internal Medicine

## 2016-08-22 VITALS — BP 135/67 | HR 72 | Temp 98.2°F | Ht 72.0 in | Wt 303.6 lb

## 2016-08-22 DIAGNOSIS — M79672 Pain in left foot: Secondary | ICD-10-CM | POA: Diagnosis not present

## 2016-08-22 DIAGNOSIS — N179 Acute kidney failure, unspecified: Secondary | ICD-10-CM

## 2016-08-22 DIAGNOSIS — F17210 Nicotine dependence, cigarettes, uncomplicated: Secondary | ICD-10-CM

## 2016-08-22 DIAGNOSIS — Z5189 Encounter for other specified aftercare: Secondary | ICD-10-CM | POA: Diagnosis not present

## 2016-08-22 DIAGNOSIS — N189 Chronic kidney disease, unspecified: Secondary | ICD-10-CM

## 2016-08-22 DIAGNOSIS — M79671 Pain in right foot: Secondary | ICD-10-CM

## 2016-08-22 DIAGNOSIS — Z8673 Personal history of transient ischemic attack (TIA), and cerebral infarction without residual deficits: Secondary | ICD-10-CM

## 2016-08-22 DIAGNOSIS — I129 Hypertensive chronic kidney disease with stage 1 through stage 4 chronic kidney disease, or unspecified chronic kidney disease: Secondary | ICD-10-CM

## 2016-08-22 DIAGNOSIS — Z79899 Other long term (current) drug therapy: Secondary | ICD-10-CM

## 2016-08-22 DIAGNOSIS — E1122 Type 2 diabetes mellitus with diabetic chronic kidney disease: Secondary | ICD-10-CM

## 2016-08-22 DIAGNOSIS — E1140 Type 2 diabetes mellitus with diabetic neuropathy, unspecified: Secondary | ICD-10-CM

## 2016-08-22 DIAGNOSIS — R55 Syncope and collapse: Secondary | ICD-10-CM | POA: Diagnosis not present

## 2016-08-22 DIAGNOSIS — F1910 Other psychoactive substance abuse, uncomplicated: Secondary | ICD-10-CM

## 2016-08-22 DIAGNOSIS — E785 Hyperlipidemia, unspecified: Secondary | ICD-10-CM

## 2016-08-22 NOTE — Progress Notes (Signed)
CC: Follow up after syncopal episode  HPI:  [name] is a 61 y.o. with past medical history of polysubstance abuse, diet controlled type 2 diabetes mellitus, cerebrovascular accident, hypertension, hyperlipidemia who presents for hospital follow-up. Since hospital follow-up peak had no further syncopal episodes. He had a cardiac monitor placed and has cut back on illicit substance use.  Today his one concern is pain that he has in his feet. This pain began 14 years ago he describes it as electricity shooting from his mid foot through his toes. This pain is throbbing and worse at night. It is associated with an involuntary shaking of his toes. Today the pain and shaking is worse in his left foot. When this pain began he had had years of untreated poorly controlled diabetes however his last A1c is 5.8 and he has not needed any diabetic medications for the past 2 years. His pain has been treated with pregabalin 150 mg twice a day and amitriptyline 100 mg at night.  Past Medical History:  Diagnosis Date  . Anxiety   . Arthritis    "generalized; not too bad" (02/06/2015)  . Cancer ([place])    NEOPLASIM OF ILEUM  . Cerebrovascular accident ([place]) 11/07/2014  . Chronic kidney disease    abn labs; "related to Metformin I was taking"  . Cough   . CVA (cerebral vascular accident) [facility]) 2009,& September 03, 2014  . Depression   . Diabetes type 2, controlled ([place]) 2004   controlled by lifestyle modification  . Diabetic peripheral neuropathy ([place])   . History of gout   . Hx of adenomatous colonic polyps 01/15/2015  . Hyperlipemia   . Hypertension   . Left sided lacunar stroke [facility])    1st stroke in 2009 2nd stroke July 2016: Acute lacunar type infarct extending from the left corona radiata through the left external capsule Thought to be due to small vessel disease     . OSA on CPAP   . Pneumonia ~ 2013 & 2014 X 2  . Shortness of breath dyspnea   . Syncope 07/2016   Review of Systems:   Please refer to HPI and assessment and plans tab for pertinent review of systems, all others reviewed and negative.   Physical Exam:  Vitals:   08/22/16 0939  BP: 135/67  Pulse: 72  Temp: 98.2 F (36.8 C)  TempSrc: Oral  SpO2: 98%  Weight: (!) 303 lb 9.6 oz (137.7 kg)  Height: 6' (1.829 m)   Physical Exam  Constitutional: He appears well-developed and well-nourished. No distress.  HENT:  Head: Normocephalic and atraumatic.  Cardiovascular: Normal rate and regular rhythm.   No murmur heard. Pulmonary/Chest: Effort normal. No respiratory distress. He has no wheezes. He has no rales.  Neurological: He is alert.  He speaks fast, speech is difficult to understand at times   Skin: Skin is warm and dry. He is not diaphoretic.     Assessment & Plan:   Pain in feet  He describes an electrical shooting pain in both of his feet which is worse at night and is associated with an involuntary shaking of his feet. At night he is not able to sleep well because she is coughing getting up and trying to shake out his feet to rid the pain. Given his well controlled diabetes and 14 year history of this neuropathic pain I suspect if it was all due to diabetic neuropathy his nerves may have burned out by now. There are components  of his presentation which are consistent with restless leg syndrome and he is on lyrica and amitriptyline at maximum doses at this time but has not tried pramipexole. Iron studies were ordered today and they are WNL. - Serum ferritin ordered today, this was 19 which is not suggestive of iron deficiency - Trial of pramipexole 0.125 mg at bedtime - trial of alpha lipoic acid supplementation and capsaicin cream  -Return to clinic in one month I called the patient's cell phone to discuss these results, the call went to personal voicemail so I left a message about the lab result and medication adjustment and asked him to return a call to the clinic.   Hospitalization for syncopal  event He was hospitalized 6/27 - 6/28 after two witnessed syncopal episodes at home. He was found to have positive orthostatics which improved after fluid bolus. During the admission CT head and cervical spine, EKG and troponins were unremarkable . Telemetry showed premature atrial contractions in a Mobitz type I second-degree AV block which was asymptomatic. Carotid Doppler showed high-grade left vertebral artery stenosis. And urine drug screen was positive for benzos, opiates, cocaine and marijuana. He was forthcoming after the drug screen and reported taking all but the cocaine the night prior to the syncope. Since he admission he's had no further syncopal episodes. He has a cardiac event monitor placed now. He has cut back on most of the illicit substances but does continue to use marijuana.  -Continue cardiac Holter monitor, cardiology is following with you appreciate their recommendation.   Chronic kidney disease During the hospitalization he had an increase in his creatinine of 1.9 up from his baseline of 1.6-1.7, this resolved with holding his lisinopril and fluid resuscitation. Today his creatinine is 1.75.  I called the patient to discuss these results, the call went to voicemail so I left a message about the lab result and asked that he stay well hydrated. - Repeat BMP at follow up   See Encounters Tab for problem based charting.  Patient discussed with Dr. Dareen Piano

## 2016-08-22 NOTE — Patient Instructions (Addendum)
[name]   Try picking up an over the counter alpha lipoic acid supplement  Try an over the counter capsaicin cream (this is made with peppers so be cautious and wash your hands thoroughly after using it)  Continue taking the lyrica and amitriptyline prescriptions  Schedule a follow up with your primary care provider in 1 month

## 2016-08-23 DIAGNOSIS — E1140 Type 2 diabetes mellitus with diabetic neuropathy, unspecified: Secondary | ICD-10-CM | POA: Insufficient documentation

## 2016-08-23 LAB — BMP8+ANION GAP
ANION GAP: 15 mmol/L (ref 10.0–18.0)
BUN/Creatinine Ratio: 13 (ref 9–20)
BUN: 22 mg/dL (ref 6–24)
CALCIUM: 8.8 mg/dL (ref 8.7–10.2)
CHLORIDE: 106 mmol/L (ref 96–106)
CO2: 22 mmol/L (ref 20–29)
Creatinine, Ser: 1.75 mg/dL — ABNORMAL HIGH (ref 0.76–1.27)
GFR calc non Af Amer: 42 mL/min/{1.73_m2} — ABNORMAL LOW (ref >59)
GFR, EST AFRICAN AMERICAN: 49 mL/min/{1.73_m2} — AB (ref >59)
GLUCOSE: 103 mg/dL — AB (ref 65–99)
POTASSIUM: 4.9 mmol/L (ref 3.5–5.2)
Sodium: 143 mmol/L (ref 134–144)

## 2016-08-23 LAB — FERRITIN: FERRITIN: 76 ng/mL (ref 30–400)

## 2016-08-23 MED ORDER — PRAMIPEXOLE DIHYDROCHLORIDE 0.125 MG PO TABS: 0.1250 mg | ORAL_TABLET | Freq: Every day | ORAL | 0 refills | Status: DC

## 2016-08-23 NOTE — Progress Notes (Signed)
Internal Medicine Clinic Attending  Case discussed with Dr. Blum at the time of the visit.  We reviewed the resident's history and exam and pertinent patient test results.  I agree with the assessment, diagnosis, and plan of care documented in the resident's note. 

## 2016-08-23 NOTE — Assessment & Plan Note (Signed)
He describes an electrical shooting pain in both of his feet which is worse at night and is associated with an involuntary shaking of his feet. At night he is not able to sleep well because she is coughing getting up and trying to shake out his feet to rid the pain. Given his well controlled diabetes and 14 year history of this neuropathic pain I suspect if it was all due to diabetic neuropathy his nerves may have burned out by now. There are components of his presentation which are consistent with restless leg syndrome and he is on lyrica and amitriptyline at maximum doses at this time but has not tried pramipexole. Iron studies were ordered today and they are WNL. - Serum ferritin ordered today, this was 13 which is not suggestive of iron deficiency - Trial of pramipexole 0.125 mg at bedtime - trial of alpha lipoic acid supplementation and capsaicin cream  -Return to clinic in one month I called the patient's cell phone to discuss these results, the call went to personal voicemail so I left a message about the lab result and medication adjustment and asked him to return a call to the clinic.

## 2016-08-23 NOTE — Telephone Encounter (Signed)
Came to [facility] appt

## 2016-08-23 NOTE — Assessment & Plan Note (Signed)
He was hospitalized 6/27 - 6/28 after two witnessed syncopal episodes at home. He was found to have positive orthostatics which improved after fluid bolus. During the admission CT head and cervical spine, EKG and troponins were unremarkable . Telemetry showed premature atrial contractions in a Mobitz type I second-degree AV block which was asymptomatic. Carotid Doppler showed high-grade left vertebral artery stenosis. And urine drug screen was positive for benzos, opiates, cocaine and marijuana. He was forthcoming after the drug screen and reported taking all but the cocaine the night prior to the syncope. Since he admission he's had no further syncopal episodes. He has a cardiac event monitor placed now. He has cut back on most of the illicit substances but does continue to use marijuana.  -Continue cardiac Holter monitor, cardiology is following with you appreciate their recommendation.

## 2016-08-23 NOTE — Assessment & Plan Note (Signed)
During the hospitalization he had an increase in his creatinine of 1.9 up from his baseline of 1.6-1.7, this resolved with holding his lisinopril and fluid resuscitation. Today his creatinine is 1.75.  I called the patient to discuss these results, the call went to voicemail so I left a message about the lab result and asked that he stay well hydrated. - Repeat BMP at follow up

## 2016-09-03 ENCOUNTER — Other Ambulatory Visit: Payer: BLUE CROSS/BLUE SHIELD

## 2016-09-03 ENCOUNTER — Ambulatory Visit: Payer: BLUE CROSS/BLUE SHIELD | Admitting: Oncology

## 2016-09-13 ENCOUNTER — Other Ambulatory Visit: Payer: Self-pay | Admitting: *Deleted

## 2016-09-13 DIAGNOSIS — E1140 Type 2 diabetes mellitus with diabetic neuropathy, unspecified: Secondary | ICD-10-CM

## 2016-09-13 MED ORDER — PREGABALIN 150 MG PO CAPS: 150.0000 mg | ORAL_CAPSULE | Freq: Two times a day (BID) | ORAL | 2 refills | Status: DC

## 2016-09-16 NOTE — Telephone Encounter (Signed)
Lyrica called in to [facility]

## 2016-09-24 ENCOUNTER — Ambulatory Visit (HOSPITAL_BASED_OUTPATIENT_CLINIC_OR_DEPARTMENT_OTHER): Payer: BLUE CROSS/BLUE SHIELD | Admitting: Oncology

## 2016-09-24 ENCOUNTER — Other Ambulatory Visit: Payer: BLUE CROSS/BLUE SHIELD

## 2016-09-24 VITALS — BP 106/53 | HR 89 | Temp 98.3°F | Resp 18 | Ht 72.0 in | Wt 298.5 lb

## 2016-09-24 DIAGNOSIS — C7B8 Other secondary neuroendocrine tumors: Secondary | ICD-10-CM

## 2016-09-24 DIAGNOSIS — C7A8 Other malignant neuroendocrine tumors: Secondary | ICD-10-CM

## 2016-09-24 DIAGNOSIS — I10 Essential (primary) hypertension: Secondary | ICD-10-CM

## 2016-09-24 NOTE — Progress Notes (Signed)
  [name] OFFICE PROGRESS NOTE   Diagnosis: Carcinoid tumor  INTERVAL HISTORY:   [name] returns as scheduled. He was admitted with syncope in June. He has been evaluated by cardiology. No recurrent syncope. He denies flushing and diarrhea. He is smoking. Good appetite.  Objective:  Vital signs in last 24 hours:  Blood pressure (!) 106/53, pulse 89, temperature 98.3 F (36.8 C), temperature source Oral, resp. rate 18, height 6' (1.829 m), weight 298 lb 8 oz (135.4 kg), SpO2 96 %.    HEENT: Neck without mass, slight prominence of the left compared to the right submandibular gland. Oropharynx without visible mass lesion. Lymphatics: No cervical, supraclavicular, axillary, or inguinal nodes Resp: Lungs clear bilaterally Cardio: Regular rate and rhythm GI: No hepatosplenomegaly, no mass, nontender Vascular: No leg edema   Lab Results: Chromogranin A-pending  Medications: I have reviewed the patient's current medications.  Assessment/Plan: 1. 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 1 mesenteric nodule contained metastatic low-grade neuroendocrine tumor, presumably a replaced lymph node  2. Intermittent abdominal pain prior to surgery December 2016-resolved  3. History tobacco abuse  4. Hyperlipidemia  5. Hypertension  6.   Admission following syncope June 2018    Disposition:  [name] remains in clinic remission from the carcinoid tumor. We will follow-up on the chromogranin A level from today. He will return for an office visit in 9 months. I encouraged him to discontinue smoking.  15 minutes were spent with the patient today. The majority of the time was used for counseling and coordination of care.  Donneta Romberg, MD  09/24/2016  12:51 PM

## 2016-09-26 LAB — CHROMOGRANIN A: CHROMOGRAN A: 4 nmol/L (ref 0–5)

## 2016-09-27 ENCOUNTER — Telehealth: Payer: Self-pay | Admitting: *Deleted

## 2016-09-27 NOTE — Telephone Encounter (Signed)
-----   Message from Chad Pier, MD sent at 09/26/2016  5:27 PM EDT ----- Please call patient, chromogranin is normal, f/u as scheduled

## 2016-09-27 NOTE — Telephone Encounter (Signed)
Left message for return call to advise lab results as directed below.  

## 2016-10-02 ENCOUNTER — Other Ambulatory Visit: Payer: Self-pay | Admitting: Internal Medicine

## 2016-10-02 DIAGNOSIS — I10 Essential (primary) hypertension: Secondary | ICD-10-CM

## 2016-10-02 DIAGNOSIS — E1140 Type 2 diabetes mellitus with diabetic neuropathy, unspecified: Secondary | ICD-10-CM

## 2016-10-03 ENCOUNTER — Telehealth: Payer: Self-pay

## 2016-10-03 NOTE — Telephone Encounter (Signed)
Spoke with patient wife concerning upcoming appointment for 05/26/17

## 2016-10-22 ENCOUNTER — Encounter: Payer: Self-pay | Admitting: Cardiovascular Disease

## 2016-10-22 ENCOUNTER — Ambulatory Visit (INDEPENDENT_AMBULATORY_CARE_PROVIDER_SITE_OTHER): Payer: BLUE CROSS/BLUE SHIELD | Admitting: Cardiovascular Disease

## 2016-10-22 VITALS — BP 128/70 | HR 80 | Ht 72.0 in | Wt 301.0 lb

## 2016-10-22 DIAGNOSIS — Z79899 Other long term (current) drug therapy: Secondary | ICD-10-CM | POA: Diagnosis not present

## 2016-10-22 DIAGNOSIS — E785 Hyperlipidemia, unspecified: Secondary | ICD-10-CM

## 2016-10-22 DIAGNOSIS — Z720 Tobacco use: Secondary | ICD-10-CM | POA: Diagnosis not present

## 2016-10-22 DIAGNOSIS — R55 Syncope and collapse: Secondary | ICD-10-CM | POA: Diagnosis not present

## 2016-10-22 DIAGNOSIS — I10 Essential (primary) hypertension: Secondary | ICD-10-CM

## 2016-10-22 DIAGNOSIS — Z8673 Personal history of transient ischemic attack (TIA), and cerebral infarction without residual deficits: Secondary | ICD-10-CM

## 2016-10-22 LAB — COMPREHENSIVE METABOLIC PANEL
A/G RATIO: 1.6 (ref 1.2–2.2)
ALBUMIN: 4.3 g/dL (ref 3.5–5.5)
ALK PHOS: 105 IU/L (ref 39–117)
ALT: 16 IU/L (ref 0–44)
AST: 15 IU/L (ref 0–40)
BILIRUBIN TOTAL: 0.4 mg/dL (ref 0.0–1.2)
BUN / CREAT RATIO: 14 (ref 9–20)
BUN: 25 mg/dL — ABNORMAL HIGH (ref 6–24)
CHLORIDE: 101 mmol/L (ref 96–106)
CO2: 22 mmol/L (ref 20–29)
Calcium: 9.9 mg/dL (ref 8.7–10.2)
Creatinine, Ser: 1.73 mg/dL — ABNORMAL HIGH (ref 0.76–1.27)
GFR calc non Af Amer: 42 mL/min/{1.73_m2} — ABNORMAL LOW (ref >59)
GFR, EST AFRICAN AMERICAN: 49 mL/min/{1.73_m2} — AB (ref >59)
Globulin, Total: 2.7 g/dL (ref 1.5–4.5)
Glucose: 97 mg/dL (ref 65–99)
POTASSIUM: 5.2 mmol/L (ref 3.5–5.2)
Sodium: 141 mmol/L (ref 134–144)
TOTAL PROTEIN: 7 g/dL (ref 6.0–8.5)

## 2016-10-22 LAB — LIPID PANEL
CHOL/HDL RATIO: 4.1 ratio (ref 0.0–5.0)
Cholesterol, Total: 157 mg/dL (ref 100–199)
HDL: 38 mg/dL — AB (ref >39)
LDL Calculated: 103 mg/dL — ABNORMAL HIGH (ref 0–99)
TRIGLYCERIDES: 82 mg/dL (ref 0–149)
VLDL CHOLESTEROL CAL: 16 mg/dL (ref 5–40)

## 2016-10-22 NOTE — Patient Instructions (Signed)
Medication Instructions:  Your physician recommends that you continue on your current medications as directed. Please refer to the Current Medication list given to you today.  Labwork: LP/CMET TODAY   Testing/Procedures: NONE  Follow-Up: Your physician wants you to follow-up in: [place] will receive a reminder letter in the mail two months in advance. If you don't receive a letter, please call our office to schedule the follow-up appointment.  Any Other Special Instructions Will Be Listed Below (If Applicable). TRY REDUCING YOUR CIGARETTES BY 1 CIGARETTE EACH WEEK UNTIL YOU QUIT  If you need a refill on your cardiac medications before your next appointment, please call your pharmacy.

## 2016-10-22 NOTE — Progress Notes (Signed)
Cardiology Office Note   Date:  10/22/2016   ID:  [name], DOB 12/29/1955, MRN [id number]  PCP:  Chad Pile, MD  Cardiologist:   Skeet Latch, MD   Chief Complaint  Patient presents with  . Follow-up    Hospital follow up and syncope follow up      History of Present Illness: [name] is a 61 y.o. male with hypertension, OSA, diabetes, CKD, prior strokes (2009, 2016), mild carotid stenosis, prior carcinoid tumor in remission, and tobacco abuse who presents for follow up.  He was seen in the hospital 07/2016 for syncope.  The first occurred when getting out of bed and the second occurred while using the restroom. Loss of consciousness was less than 1 minute with each episode. There was no bowel or bladder incontinence or seizure-like activity. His creatinine was mildly elevated on admission. Orthostatics were normal. He had a head CT that showed a small right parietal scalp contusion and H-indeterminate infarcts. Echo that admission revealed LVEF 65-70% with grade 2 diastolic dysfunction and aortic sclerosis without stenosis. On telemetry he was noted to have occasional pauses, the longest being 2.39 seconds. He was referred for a 30 day event monitor 08/2016 that revealed sinus rhythm and sinus arrhythmia with occasional PVCs. There were no significant pauses. Carotid Dopplers revealed 1-39% ICA stenosis bilaterally.  Since his last appointment Mr. [name] Has been doing well. His main complaint is neuropathy in his left foot. This makes it difficult for him to sleep at night. It is also difficult for him to exercise. He is not able to swim. He denies any chest pain or shortness of breath. He also has not had any recurrent syncopal episodes. He continues to smoke one pack of cigarettes daily. In the past he was able to quit for 9 years with a cold [place] method. He is ready to try quitting again.  Nicotine patches and gum have not been helpful. His wife also  smokes.  Past Medical History:  Diagnosis Date  . Anxiety   . Arthritis    "generalized; not too bad" (02/06/2015)  . Cancer ([place])    NEOPLASIM OF ILEUM  . Cerebrovascular accident ([place]) 11/07/2014  . Chronic kidney disease    abn labs; "related to Metformin I was taking"  . Cough   . CVA (cerebral vascular accident) [facility]) 2009,& September 03, 2014  . Depression   . Diabetes type 2, controlled ([place]) 2004   controlled by lifestyle modification  . Diabetic peripheral neuropathy ([place])   . History of gout   . Hx of adenomatous colonic polyps 01/15/2015  . Hyperlipemia   . Hypertension   . Left sided lacunar stroke [facility])    1st stroke in 2009 2nd stroke July 2016: Acute lacunar type infarct extending from the left corona radiata through the left external capsule Thought to be due to small vessel disease     . OSA on CPAP   . Pneumonia ~ 2013 & 2014 X 2  . Shortness of breath dyspnea   . Syncope 07/2016    Past Surgical History:  Procedure Laterality Date  . APPENDECTOMY  1980's  . BOWEL RESECTION N/A 02/06/2015   Procedure: SMALL BOWEL RESECTION;  Surgeon: Chad Boston, MD;  Location: [place];  Service: General;  Laterality: N/A;  . CARDIAC CATHETERIZATION  2016  . CAROTID ENDARTERECTOMY Left 11/2007   mptes 6132-11  . HERNIA REPAIR    . KNEE ARTHROSCOPY Left X 2  .  LAPAROSCOPIC ASSISTED VENTRAL HERNIA REPAIR  02/06/2015  . LAPAROSCOPIC SMALL BOWEL RESECTION  02/06/2015  . LAPAROSCOPY N/A 02/06/2015   Procedure: LAPAROSCOPY DIAGNOSTIC;  Surgeon: Chad Boston, MD;  Location: [place];  Service: General;  Laterality: N/A;     Current Outpatient Prescriptions  Medication Sig Dispense Refill  . amitriptyline (ELAVIL) 100 MG tablet Take 1 tablet (100 mg total) by mouth at bedtime. 90 tablet 2  . aspirin EC 81 MG tablet Take 1 tablet (81 mg total) by mouth daily. 90 tablet 3  . atorvastatin (LIPITOR) 40 MG tablet Take 1 tablet (40 mg total) by mouth daily at 6 PM. 90 tablet 3  .  busPIRone (BUSPAR) 7.5 MG tablet TAKE ONE TABLET BY MOUTH TWICE DAILY (Patient taking differently: TAKE 7.5 MG BY MOUTH TWICE DAILY) 180 tablet 2  . citalopram (CELEXA) 40 MG tablet Take 1 tablet (40 mg total) by mouth daily. 90 tablet 2  . lisinopril (PRINIVIL,ZESTRIL) 30 MG tablet TAKE ONE TABLET BY MOUTH ONCE DAILY 90 tablet 2  . pramipexole (MIRAPEX) 0.125 MG tablet TAKE 1 TABLET BY MOUTH AT BEDTIME 30 tablet 0  . pregabalin (LYRICA) 150 MG capsule Take 1 capsule (150 mg total) by mouth 2 (two) times daily. 60 capsule 2   No current facility-administered medications for this visit.     Allergies:   Wellbutrin [bupropion]    Social History:  The patient  reports that he has been smoking Cigarettes.  He has a 30.00 pack-year smoking history. He has never used smokeless tobacco. He reports that he does not drink alcohol or use drugs.   Family History:  The patient's family history includes Arrhythmia in his mother; Colon polyps in his father and mother; Diabetes in his brother, father, mother, and sister; Heart attack in his father; Heart failure in his father and mother; Hypertension in his brother, father, mother, and sister; Stroke in his father.    ROS:  Please see the history of present illness.   Otherwise, review of systems are positive for none.   All other systems are reviewed and negative.    PHYSICAL EXAM: VS:  BP 128/70   Pulse 80   Ht 6' (1.829 m)   Wt (!) 136.5 kg (301 lb)   BMI 40.82 kg/m  , BMI Body mass index is 40.82 kg/m. GENERAL:  Well appearing HEENT:  Pupils equal round and reactive, fundi not visualized, oral mucosa unremarkable NECK:  No jugular venous distention, waveform within normal limits, carotid upstroke brisk and symmetric, no bruits, no thyromegaly LYMPHATICS:  No cervical adenopathy LUNGS:  Clear to auscultation bilaterally HEART:  RRR.  PMI not displaced or sustained,S1 and S2 within normal limits, no S3, no S4, no clicks, no rubs, no murmurs ABD:   Flat, positive bowel sounds normal in frequency in pitch, no bruits, no rebound, no guarding, no midline pulsatile mass, no hepatomegaly, no splenomegaly EXT:  2 plus pulses throughout, no edema, no cyanosis no clubbing SKIN:  No rashes no nodules NEURO:  Cranial nerves II through XII grossly intact, motor grossly intact throughout PSYCH:  Cognitively intact, oriented to person place and time   EKG:  EKG is not ordered today.   Echocardiogram: 08/14/2016 Study Conclusions  - Left ventricle: The cavity size was normal. Wall thickness was increased in a pattern of mild LVH. Systolic function was vigorous. The estimated ejection fraction was in the range of 65% to 70%. Wall motion was normal; there were no regional wall motion abnormalities. Features  are consistent with a pseudonormal left ventricular filling pattern, with concomitant abnormal relaxation and increased filling pressure (grade 2 diastolic dysfunction). - Aortic valve: Trileaflet; moderately calcified leaflets. Sclerosis without stenosis. - Mitral valve: There was no significant regurgitation. - Right ventricle: The cavity size was normal. Systolic function was normal. - Pulmonary arteries: No complete TR doppler jet so unable to estimate PA systolic pressure. - Inferior vena cava: The vessel was normal in size. The respirophasic diameter changes were in the normal range (= 50%), consistent with normal central venous pressure. - Pericardium, extracardiac: A trivial pericardial effusion was identified posterior to the heart.  Impressions:  - Normal LV size with mild LV hypertrophy. Vigorous systolic function, EF 62-70%. Moderate diastolic dysfunction. Normal RV size and systolic function. Aortic valve sclerosis without significant stenosis.  Recent Labs: 08/14/2016: Hemoglobin 14.5; Platelets 171 08/22/2016: BUN 22; Creatinine, Ser 1.75; Potassium 4.9; Sodium 143    Lipid  Panel    Component Value Date/Time   CHOL 203 (H) 09/04/2014 0405   TRIG 106 09/04/2014 0405   HDL 31 (L) 09/04/2014 0405   CHOLHDL 6.5 09/04/2014 0405   VLDL 21 09/04/2014 0405   LDLCALC 151 (H) 09/04/2014 0405      Wt Readings from Last 3 Encounters:  10/22/16 (!) 136.5 kg (301 lb)  09/24/16 135.4 kg (298 lb 8 oz)  08/22/16 (!) 137.7 kg (303 lb 9.6 oz)      ASSESSMENT AND PLAN:  # Hypertension: Blood pressure well-controlled on lisinopril. No changes  # Hyperlipidemia:  No recent lipids aren't file. We will check fasting lipids and a CMP. Continue atorvastatin.   # Syncope:  No recurrent episodes. Carotid Doppler showed mild carotid stenosis. Echo was unremarkable. No arrhythmias noted on Holter.   # Bradycardia:  Asymptomatic.  Avoid nodal agents.   # Prior stroke:  # Mild carotid stenosis: Continue aspirin and atorvastatin.  # Tobacco abuse: Mr. Hoecker is ready to quit smoking.  In the past patches and gum have not been helpful. We discussed reducing his cigarettes by one per week. Both he and his wife are going to try this. We discussed smoking cessation. 5   Current medicines are reviewed at length with the patient today.  The patient does not have concerns regarding medicines.  The following changes have been made:  no change  Labs/ tests ordered today include:   Orders Placed This Encounter  Procedures  . Lipid panel  . Comprehensive metabolic panel     Disposition:   FU with Makya Yurko C. Oval Linsey, MD, [facility] in 6 months.     This note was written with the assistance of speech recognition software.  Please excuse any transcriptional errors.  Signed, Landon Bassford C. Oval Linsey, [place], [facility]  10/22/2016 10:14 AM    [place]

## 2016-10-29 NOTE — Telephone Encounter (Signed)
-----   Message from Skeet Latch, MD sent at 10/24/2016  1:44 PM EDT ----- Cholesterol levels are better than in 2016.  Continue atorvastatin and increase intake of fish.  Limit fried foods, fatty food, red meat and cheese. Kidney function is stable.

## 2016-10-29 NOTE — Telephone Encounter (Signed)
Left message to call back  

## 2016-11-01 ENCOUNTER — Encounter: Payer: Self-pay | Admitting: Cardiovascular Disease

## 2016-11-01 NOTE — Telephone Encounter (Signed)
°  Follow Up   Returning call. OK to leave information of VM. Please call.

## 2016-11-01 NOTE — Telephone Encounter (Signed)
This encounter was created in error - please disregard.

## 2016-11-01 NOTE — Telephone Encounter (Signed)
Left message to call back  

## 2016-11-01 NOTE — Telephone Encounter (Signed)
Reviewed labs with wife

## 2016-11-08 ENCOUNTER — Other Ambulatory Visit: Payer: Self-pay | Admitting: Internal Medicine

## 2016-11-30 ENCOUNTER — Other Ambulatory Visit: Payer: Self-pay | Admitting: Internal Medicine

## 2016-12-07 ENCOUNTER — Other Ambulatory Visit: Payer: Self-pay | Admitting: Internal Medicine

## 2016-12-07 DIAGNOSIS — E1140 Type 2 diabetes mellitus with diabetic neuropathy, unspecified: Secondary | ICD-10-CM

## 2016-12-11 ENCOUNTER — Encounter: Payer: Self-pay | Admitting: Internal Medicine

## 2016-12-11 ENCOUNTER — Ambulatory Visit (INDEPENDENT_AMBULATORY_CARE_PROVIDER_SITE_OTHER): Payer: BLUE CROSS/BLUE SHIELD | Admitting: Internal Medicine

## 2016-12-11 VITALS — BP 135/70 | HR 82 | Temp 98.7°F | Ht 72.0 in | Wt 315.4 lb

## 2016-12-11 DIAGNOSIS — Z23 Encounter for immunization: Secondary | ICD-10-CM

## 2016-12-11 DIAGNOSIS — Z79899 Other long term (current) drug therapy: Secondary | ICD-10-CM

## 2016-12-11 DIAGNOSIS — R55 Syncope and collapse: Secondary | ICD-10-CM

## 2016-12-11 DIAGNOSIS — Z8679 Personal history of other diseases of the circulatory system: Secondary | ICD-10-CM | POA: Diagnosis not present

## 2016-12-11 DIAGNOSIS — I10 Essential (primary) hypertension: Secondary | ICD-10-CM | POA: Diagnosis not present

## 2016-12-11 DIAGNOSIS — E1140 Type 2 diabetes mellitus with diabetic neuropathy, unspecified: Secondary | ICD-10-CM

## 2016-12-11 DIAGNOSIS — E1142 Type 2 diabetes mellitus with diabetic polyneuropathy: Secondary | ICD-10-CM

## 2016-12-11 DIAGNOSIS — E669 Obesity, unspecified: Secondary | ICD-10-CM

## 2016-12-11 DIAGNOSIS — F17210 Nicotine dependence, cigarettes, uncomplicated: Secondary | ICD-10-CM | POA: Diagnosis not present

## 2016-12-11 DIAGNOSIS — Z0000 Encounter for general adult medical examination without abnormal findings: Secondary | ICD-10-CM

## 2016-12-11 DIAGNOSIS — Z6841 Body Mass Index (BMI) 40.0 and over, adult: Secondary | ICD-10-CM

## 2016-12-11 DIAGNOSIS — F17200 Nicotine dependence, unspecified, uncomplicated: Secondary | ICD-10-CM

## 2016-12-11 MED ORDER — VARENICLINE TARTRATE 0.5 MG PO TABS: 0.5000 mg | ORAL_TABLET | Freq: Two times a day (BID) | ORAL | 2 refills | Status: DC

## 2016-12-11 NOTE — Assessment & Plan Note (Addendum)
Still smoking about 1 PPD. Would like to try Chantix to help quit. Reports having Rx for Chantix starter pack from another provider.  A/P Start Chantix.

## 2016-12-11 NOTE — Assessment & Plan Note (Addendum)
Flu shot given today

## 2016-12-11 NOTE — Assessment & Plan Note (Addendum)
BP Readings from Last 3 Encounters:  12/11/16 135/70  10/22/16 128/70  09/24/16 (!) 106/53    Lab Results  Component Value Date   NA 141 10/22/2016   K 5.2 10/22/2016   CREATININE 1.73 (H) 10/22/2016   Currently on lisinopril 30 mg daily. Denies any dizziness/lightheadednss, chest pain, shortness of breath, palpitations, vision changes, headaches.   A/P: Continue current medications

## 2016-12-11 NOTE — Patient Instructions (Addendum)
Mr. [name],  I am going to send in a prescription for Chantix for you. Use the starter pack you have at home first and then start using the prescription I have sent in. You can increase the Pramipexole every week up to 0.5 mg total (4 pills) to see if you have any benefit. I will also get you set up with Podiatry.   Please follow up with me in 3 months.

## 2016-12-11 NOTE — Assessment & Plan Note (Addendum)
Currently on max doses of lyrica and amitriptyline. Last visit started on trial of pramipexole for possible restless leg syndrome component.  Reports L>R. States it is occurring earlier in the day and lasting longer. Reports no response to the prampiexole. Prior B12 and Iron studies wnl. ABI today wnl. Requesting referral to podiatry.  A/P Up-titrate Ppramipexole to max dose over the next month. Will get nerve conduction studies. Referral to podiatry

## 2016-12-11 NOTE — Assessment & Plan Note (Signed)
No subsequent episodes since June hospitalization. Followed up with cardiology with no arrhythmias noted on Holter monitor.

## 2016-12-11 NOTE — Assessment & Plan Note (Addendum)
Lab Results  Component Value Date   HGBA1C 5.8 (H) 08/15/2016   HGBA1C 5.9 12/20/2015   HGBA1C 5.4 01/25/2015    Diet controlled DM type II. Most recent Hgb A1c 08/16/2016 was 5.8.   A/P: Continue monitoring q82months

## 2016-12-11 NOTE — Progress Notes (Signed)
   CC: HTN follow up  HPI:  [name] is a 61 y.o. male with a past medical history listed below here today for follow up of his HTN.   For details of today's visit and the status of his chronic medical issues please refer to the assessment and plan.  Past Medical History:  Diagnosis Date  . Anxiety   . Arthritis    "generalized; not too bad" (02/06/2015)  . Cancer ([place])    NEOPLASIM OF ILEUM  . Cerebrovascular accident ([place]) 11/07/2014  . Chronic kidney disease    abn labs; "related to Metformin I was taking"  . Cough   . CVA (cerebral vascular accident) [facility]) 2009,& September 03, 2014  . Depression   . Diabetes type 2, controlled ([place]) 2004   controlled by lifestyle modification  . Diabetic peripheral neuropathy ([place])   . History of gout   . Hx of adenomatous colonic polyps 01/15/2015  . Hyperlipemia   . Hypertension   . Left sided lacunar stroke    1st stroke in 2009 2nd stroke July 2016: Acute lacunar type infarct extending from the left corona radiata through the left external capsule Thought to be due to small vessel disease     . OSA on CPAP   . Pneumonia ~ 2013 & 2014 X 2  . Shortness of breath dyspnea   . Syncope 07/2016   Review of Systems:   No chest pain or shortness of breath  Physical Exam:  Vitals:   12/11/16 1320  BP: 135/70  Pulse: 82  Temp: 98.7 F (37.1 C)  TempSrc: Oral  SpO2: 94%  Weight: (!) 315 lb 6.4 oz (143.1 kg)  Height: 6' (1.829 m)   GENERAL- alert, co-operative, obese male, appears as stated age, not in any distress. CARDIAC- RRR, no murmurs, rubs or gallops. RESP- Moving equal volumes of air, and clear to auscultation bilaterally ABDOMEN- Soft, nontender, bowel sounds present. EXTREMITIES- pulse 2+, symmetric, no pedal edema. SKIN- Warm, dry, No rash or lesion. PSYCH- Depressed mood and affect, appropriate thought content and speech.   Assessment & Plan:   See Encounters Tab for problem based charting.  Patient  discussed with Dr. Dareen Piano

## 2016-12-17 ENCOUNTER — Other Ambulatory Visit: Payer: Self-pay | Admitting: *Deleted

## 2016-12-17 DIAGNOSIS — E1140 Type 2 diabetes mellitus with diabetic neuropathy, unspecified: Secondary | ICD-10-CM

## 2016-12-18 MED ORDER — PREGABALIN 150 MG PO CAPS: 150.0000 mg | ORAL_CAPSULE | Freq: Two times a day (BID) | ORAL | 2 refills | Status: DC

## 2016-12-18 NOTE — Telephone Encounter (Signed)
Lyrica called in to [facility]

## 2016-12-22 NOTE — Progress Notes (Signed)
Internal Medicine Clinic Attending  Case discussed with Dr. Boswell at the time of the visit.  We reviewed the resident's history and exam and pertinent patient test results.  I agree with the assessment, diagnosis, and plan of care documented in the resident's note.  

## 2016-12-25 ENCOUNTER — Ambulatory Visit (INDEPENDENT_AMBULATORY_CARE_PROVIDER_SITE_OTHER): Payer: BLUE CROSS/BLUE SHIELD | Admitting: Podiatry

## 2016-12-25 ENCOUNTER — Encounter: Payer: Self-pay | Admitting: Podiatry

## 2016-12-25 VITALS — BP 129/69 | HR 85 | Ht 72.0 in | Wt 300.0 lb

## 2016-12-25 DIAGNOSIS — B351 Tinea unguium: Secondary | ICD-10-CM

## 2016-12-25 DIAGNOSIS — E0842 Diabetes mellitus due to underlying condition with diabetic polyneuropathy: Secondary | ICD-10-CM

## 2016-12-25 MED ORDER — HYDROCODONE-IBUPROFEN 7.5-200 MG PO TABS: 1.0000 | ORAL_TABLET | Freq: Four times a day (QID) | ORAL | 0 refills | Status: DC | PRN

## 2016-12-25 NOTE — Progress Notes (Signed)
SUBJECTIVE: 60 y.o. year old male presents complaining of bilateral nerve pain. Having difficulty sleeping at night from foot pain.  Been diabetic x 20 years. Blood sugar last A1c was 5.3. Also has lost weight. Not working at this time.  Positive history of stroke 2015 and 2009 that affected him with speech and weak right hand. Left knee surgery 1989 and Colon surgery 2015 for cancer removal. Left Carotid artery stent. Tanking Lyrica that is not helping like hoped for.  Patient is referred by Dr. Charlynn Grimes.   Review of Systems  Constitutional: Negative.   HENT: Negative.   Eyes: Negative.   Respiratory: Negative.   Cardiovascular: Positive for leg swelling. Negative for chest pain, palpitations, orthopnea and claudication.  Gastrointestinal: Negative.   Genitourinary: Negative.   Musculoskeletal: Negative.   Neurological: Positive for tingling, sensory change and speech change.  Endo/Heme/Allergies: Negative.     OBJECTIVE: DERMATOLOGIC EXAMINATION: Thick dystrophic nails x 10.  VASCULAR EXAMINATION OF LOWER LIMBS: Left foot pedal pulses are palpable with normal pulsation.  Right foot pedal pulses are not palpable. Positive of foot and ankle edema, non pitting left.  Temperature gradient from tibial crest to dorsum of foot is within normal bilateral.  NEUROLOGIC EXAMINATION OF THE LOWER LIMBS: Achilles DTR is present and within normal. Monofilament (Semmes-Weinstein 10-gm) sensory testing positive 3 out of 6, bilateral. Vibratory sensations(128Hz  turning fork) intact at medial and lateral forefoot bilateral.  Sharp and Dull discriminatory sensations at the plantar ball of hallux is intact bilateral.   MUSCULOSKELETAL EXAMINATION: No gross deformity.  ASSESSMENT: Onychomycosis bilateral. Diabetic neuropathy. Pain in both feet.  PLAN: Reviewed findings and available treatment options. Discussed possible benefit of Compound cream for neuralgic pain and prescribed..  All  nails debrided. May take pain medication if needed.

## 2016-12-25 NOTE — Patient Instructions (Signed)
Seen for diabetic foot and neuropathy pain. Reviewed findings and available treatment options. Compound cream and Vicodin 7.5 prescribed. All nails debrided. Return in 3 months.

## 2016-12-29 ENCOUNTER — Other Ambulatory Visit: Payer: Self-pay | Admitting: Internal Medicine

## 2016-12-29 DIAGNOSIS — F329 Major depressive disorder, single episode, unspecified: Secondary | ICD-10-CM

## 2016-12-29 DIAGNOSIS — F32A Depression, unspecified: Secondary | ICD-10-CM

## 2017-01-13 ENCOUNTER — Telehealth: Payer: Self-pay | Admitting: *Deleted

## 2017-01-13 NOTE — Telephone Encounter (Signed)
01/13/2017 [name]'s wife called and ask can he get another rx for foot pain, They would only give him 20 pills and he needs another prescription to get the remaining.

## 2017-01-14 ENCOUNTER — Telehealth: Payer: Self-pay | Admitting: *Deleted

## 2017-01-14 MED ORDER — HYDROCODONE-IBUPROFEN 7.5-200 MG PO TABS: 1.0000 | ORAL_TABLET | Freq: Four times a day (QID) | ORAL | 0 refills | Status: DC | PRN

## 2017-01-14 NOTE — Telephone Encounter (Signed)
01/13/17 Patient's wife called and ask can he get another rx for foot pain, they would only fill part of the last prescription given.

## 2017-01-15 ENCOUNTER — Other Ambulatory Visit: Payer: Self-pay | Admitting: *Deleted

## 2017-01-15 MED ORDER — AMITRIPTYLINE HCL 100 MG PO TABS: 100.0000 mg | ORAL_TABLET | Freq: Every day | ORAL | 2 refills | Status: DC

## 2017-01-24 ENCOUNTER — Other Ambulatory Visit: Payer: Self-pay | Admitting: *Deleted

## 2017-01-29 MED ORDER — PRAMIPEXOLE DIHYDROCHLORIDE 0.125 MG PO TABS: 0.1250 mg | ORAL_TABLET | Freq: Every day | ORAL | 2 refills | Status: DC

## 2017-02-20 ENCOUNTER — Telehealth: Payer: Self-pay

## 2017-02-20 ENCOUNTER — Other Ambulatory Visit: Payer: Self-pay | Admitting: Podiatry

## 2017-02-20 MED ORDER — HYDROCODONE-IBUPROFEN 7.5-200 MG PO TABS: 1.0000 | ORAL_TABLET | Freq: Two times a day (BID) | ORAL | 0 refills | Status: DC | PRN

## 2017-02-20 NOTE — Telephone Encounter (Signed)
Pts wife called requesting a refill of hydrocodone. Please advise.

## 2017-02-21 NOTE — Telephone Encounter (Signed)
rx refilled by provider. Pt notified.

## 2017-03-17 ENCOUNTER — Other Ambulatory Visit: Payer: Self-pay | Admitting: *Deleted

## 2017-03-17 DIAGNOSIS — F329 Major depressive disorder, single episode, unspecified: Secondary | ICD-10-CM

## 2017-03-17 DIAGNOSIS — F32A Depression, unspecified: Secondary | ICD-10-CM

## 2017-03-18 MED ORDER — BUSPIRONE HCL 7.5 MG PO TABS: 7.5000 mg | ORAL_TABLET | Freq: Two times a day (BID) | ORAL | 2 refills | Status: DC

## 2017-03-19 ENCOUNTER — Other Ambulatory Visit: Payer: Self-pay | Admitting: *Deleted

## 2017-03-19 DIAGNOSIS — E1140 Type 2 diabetes mellitus with diabetic neuropathy, unspecified: Secondary | ICD-10-CM

## 2017-03-20 MED ORDER — PREGABALIN 150 MG PO CAPS: 150.0000 mg | ORAL_CAPSULE | Freq: Two times a day (BID) | ORAL | 2 refills | Status: DC

## 2017-03-20 NOTE — Telephone Encounter (Signed)
Lyrica rx called to [facility] on [address].

## 2017-03-21 ENCOUNTER — Telehealth: Payer: Self-pay | Admitting: Internal Medicine

## 2017-03-27 ENCOUNTER — Ambulatory Visit: Payer: BLUE CROSS/BLUE SHIELD | Admitting: Podiatry

## 2017-03-27 DIAGNOSIS — B351 Tinea unguium: Secondary | ICD-10-CM | POA: Diagnosis not present

## 2017-03-27 DIAGNOSIS — E0842 Diabetes mellitus due to underlying condition with diabetic polyneuropathy: Secondary | ICD-10-CM | POA: Diagnosis not present

## 2017-03-27 NOTE — Patient Instructions (Signed)
Seen for painful feet. Was treated with compound cream for painful neuropathy condition. During the visit noted of cold feet with none palpable right foot pulse, and rest pain. Foot pain may be due to Microangiopathy, lacking blood flow to extremity, especially tip of toes. Findings and available options discussed. All nails debrided. May benefit from Vascular consult for none palpable pedal pulse on right foot.  Condition may improve from ceasing smoking. Return in 3 month.

## 2017-03-27 NOTE — Progress Notes (Signed)
Subjective: 62 y.o. year old male patient presents complaining of painful feet. He has tried compound cream with some relief. Been diabetic for 20 years and last A1c was 5.3.  Positive history of stroke 2015 and 2009 that affected him with speech and weak right hand. Left knee surgery 1989 and Colon surgery 2015 for cancer removal. Left Carotid artery stent. Tanking Lyrica that is not helping like hoped for.   Objective: Dermatologic: Thick yellow deformed nails x 10. Vascular: Pedal pulses are all palpable. Orthopedic: No gross deformities. Neurologic: Subjective burning nerve pain bilateral.  Assessment: Dystrophic mycotic nails x 10. Diabetic neuropathy. Bilateral foot pain.  Treatment: All mycotic nails debrided.  Continue with compound cream. Return in 3 months or as needed.

## 2017-03-31 ENCOUNTER — Encounter: Payer: Self-pay | Admitting: Podiatry

## 2017-04-23 ENCOUNTER — Encounter: Payer: BLUE CROSS/BLUE SHIELD | Admitting: Internal Medicine

## 2017-05-07 ENCOUNTER — Telehealth: Payer: Self-pay | Admitting: *Deleted

## 2017-05-07 NOTE — Telephone Encounter (Signed)
Patient's spouse called requesting a refill on pain medication for the patient. Advised that patient would need an appointment and per new guidelines Dr. Caffie Pinto cannot manage chronic pain. Routing for provider review

## 2017-05-09 NOTE — Telephone Encounter (Signed)
Pt wife called requesting an update on the status of the refill request. Advised that pt would need to be seen for pain as well and hydrocodone refill due to new guidelines on writing controlled substances. Pts wife declined scheduling an appointment and stated that she would contact his PCP regarding that matter.

## 2017-05-13 NOTE — Telephone Encounter (Signed)
Thank you :)

## 2017-05-21 ENCOUNTER — Encounter: Payer: BLUE CROSS/BLUE SHIELD | Admitting: Internal Medicine

## 2017-05-26 ENCOUNTER — Ambulatory Visit: Payer: BLUE CROSS/BLUE SHIELD | Admitting: Oncology

## 2017-05-26 ENCOUNTER — Other Ambulatory Visit: Payer: BLUE CROSS/BLUE SHIELD

## 2017-06-20 ENCOUNTER — Other Ambulatory Visit: Payer: Self-pay | Admitting: *Deleted

## 2017-06-20 DIAGNOSIS — E1140 Type 2 diabetes mellitus with diabetic neuropathy, unspecified: Secondary | ICD-10-CM

## 2017-06-21 MED ORDER — PREGABALIN 150 MG PO CAPS: 150.0000 mg | ORAL_CAPSULE | Freq: Two times a day (BID) | ORAL | 2 refills | Status: DC

## 2017-06-23 NOTE — Telephone Encounter (Signed)
Rx for Lyrica called to [place]. Hubbard Hartshorn, RN, BSN

## 2017-06-24 ENCOUNTER — Ambulatory Visit: Payer: BLUE CROSS/BLUE SHIELD | Admitting: Podiatry

## 2017-06-24 ENCOUNTER — Other Ambulatory Visit: Payer: Self-pay | Admitting: *Deleted

## 2017-06-24 ENCOUNTER — Encounter: Payer: Self-pay | Admitting: Podiatry

## 2017-06-24 DIAGNOSIS — M79672 Pain in left foot: Secondary | ICD-10-CM

## 2017-06-24 DIAGNOSIS — E0842 Diabetes mellitus due to underlying condition with diabetic polyneuropathy: Secondary | ICD-10-CM | POA: Diagnosis not present

## 2017-06-24 DIAGNOSIS — M79671 Pain in right foot: Secondary | ICD-10-CM

## 2017-06-24 DIAGNOSIS — B351 Tinea unguium: Secondary | ICD-10-CM

## 2017-06-24 MED ORDER — HYDROCODONE-IBUPROFEN 7.5-200 MG PO TABS: 1.0000 | ORAL_TABLET | Freq: Four times a day (QID) | ORAL | 0 refills | Status: AC | PRN

## 2017-06-24 NOTE — Progress Notes (Signed)
Subjective: 62 y.o. year old male patient presents accompanied by his wife complaining of painful feet. Patient requests toe nails trimmed and pain medication refilled. He was prescribed with Hydrocodone 7.5/200 #60 on 03/27/17. Compound cream is helping some with foot pain. Still in need of pain medication to help him at bed time.  Been diabetic for 20 years and last A1c was 5.3. Next one is due tomorrow.   Positive history ofstroke 2015 and 2009 that affected him with speech and weak right hand. Left knee surgery 1989 and Colon surgery 2015 for cancer removal. Left Carotid artery stent. Tanking Lyrica that is not helping like hoped for.  Objective: Dermatologic: Thick yellow deformed nails x 10. Vascular: Pedal pulses are all palpable except right DP. Orthopedic: No gross osseous deformities noted. Neurologic: Burning foot pain bilateral.  Assessment: Dystrophic mycotic nails x 10. Peripheral neuropathy. Pain in both feet.  Treatment: Reviewed findings and available treatment options, such as being treated by pain clinic to manage chronic foot pain. All mycotic nails debrided.  Pain medication prescribed for 5 day supply. Referral placed for Pain clinic. Continue with Compound cream as needed. Return in 3 months for routine foot care.

## 2017-06-24 NOTE — Patient Instructions (Signed)
Seen for hypertrophic nails and painful feet. All nails debrided. Pain medication prescribed for 5 days. Pain clinic referral placed. Return in 3 months or as needed.

## 2017-06-24 NOTE — Telephone Encounter (Signed)
lyrica refilled.  

## 2017-06-25 ENCOUNTER — Ambulatory Visit: Payer: BLUE CROSS/BLUE SHIELD | Admitting: Internal Medicine

## 2017-06-25 ENCOUNTER — Other Ambulatory Visit: Payer: Self-pay

## 2017-06-25 ENCOUNTER — Encounter: Payer: Self-pay | Admitting: Internal Medicine

## 2017-06-25 VITALS — BP 103/58 | HR 95 | Temp 98.1°F | Ht 72.0 in | Wt 322.4 lb

## 2017-06-25 DIAGNOSIS — E1140 Type 2 diabetes mellitus with diabetic neuropathy, unspecified: Secondary | ICD-10-CM

## 2017-06-25 DIAGNOSIS — F419 Anxiety disorder, unspecified: Secondary | ICD-10-CM

## 2017-06-25 DIAGNOSIS — I10 Essential (primary) hypertension: Secondary | ICD-10-CM | POA: Diagnosis not present

## 2017-06-25 DIAGNOSIS — F339 Major depressive disorder, recurrent, unspecified: Secondary | ICD-10-CM | POA: Diagnosis not present

## 2017-06-25 DIAGNOSIS — Z79899 Other long term (current) drug therapy: Secondary | ICD-10-CM

## 2017-06-25 DIAGNOSIS — M79673 Pain in unspecified foot: Secondary | ICD-10-CM | POA: Diagnosis not present

## 2017-06-25 DIAGNOSIS — F17210 Nicotine dependence, cigarettes, uncomplicated: Secondary | ICD-10-CM | POA: Diagnosis not present

## 2017-06-25 DIAGNOSIS — F329 Major depressive disorder, single episode, unspecified: Secondary | ICD-10-CM

## 2017-06-25 DIAGNOSIS — Z0000 Encounter for general adult medical examination without abnormal findings: Secondary | ICD-10-CM

## 2017-06-25 DIAGNOSIS — R7303 Prediabetes: Secondary | ICD-10-CM

## 2017-06-25 DIAGNOSIS — F32A Depression, unspecified: Secondary | ICD-10-CM

## 2017-06-25 DIAGNOSIS — F17200 Nicotine dependence, unspecified, uncomplicated: Secondary | ICD-10-CM

## 2017-06-25 LAB — POCT GLYCOSYLATED HEMOGLOBIN (HGB A1C): Hemoglobin A1C: 6

## 2017-06-25 LAB — GLUCOSE, CAPILLARY: Glucose-Capillary: 170 mg/dL — ABNORMAL HIGH (ref 65–99)

## 2017-06-25 MED ORDER — LISINOPRIL 5 MG PO TABS: 5.0000 mg | ORAL_TABLET | Freq: Every day | ORAL | 1 refills | Status: DC

## 2017-06-25 NOTE — Progress Notes (Signed)
   CC: HTN follow up  HPI:  [name] is a 62 y.o. male with a past medical history listed below here today for follow up of his HTN.  For details of today's visit and the status of his chronic medical issues please refer to the assessment and plan.   Past Medical History:  Diagnosis Date  . Anxiety   . Arthritis    "generalized; not too bad" (02/06/2015)  . Cancer ([place])    NEOPLASIM OF ILEUM  . Cerebrovascular accident ([place]) 11/07/2014  . Chronic kidney disease    abn labs; "related to Metformin I was taking"  . Cough   . CVA (cerebral vascular accident) [facility]) 2009,& September 03, 2014  . Depression   . Diabetes type 2, controlled ([place]) 2004   controlled by lifestyle modification  . Diabetic peripheral neuropathy ([place])   . History of gout   . Hx of adenomatous colonic polyps 01/15/2015  . Hyperlipemia   . Hypertension   . Left sided lacunar stroke [facility])    1st stroke in 2009 2nd stroke July 2016: Acute lacunar type infarct extending from the left corona radiata through the left external capsule Thought to be due to small vessel disease     . OSA on CPAP   . Pneumonia ~ 2013 & 2014 X 2  . Shortness of breath dyspnea   . Syncope 07/2016   Review of Systems:   No chest pain or shortness of breath  Physical Exam:  Vitals:   06/25/17 1413 06/25/17 1428 06/25/17 1443 06/25/17 1446  BP: (!) 103/58     Pulse: 95     Temp: 98.1 F (36.7 C)     TempSrc: Oral     SpO2: 100% 98% 96% 96%  Weight: (!) 322 lb 6.4 oz (146.2 kg)     Height: 6' (1.829 m)      GENERAL- alert, co-operative, appears as stated age, not in any distress. HEENT- Atraumatic, normocephalic, PERRL, EOMI, oral mucosa appears moist CARDIAC- RRR, no murmurs, rubs or gallops. RESP- Moving equal volumes of air, and clear to auscultation bilaterally, no wheezes or crackles. ABDOMEN- Soft, nontender, bowel sounds present. NEURO- No obvious Cr N abnormality. EXTREMITIES- pulse 2+, symmetric, no pedal  edema. SKIN- Warm, dry, No rash or lesion. PSYCH- Depressed mood and affect, appropriate thought content and speech.   Assessment & Plan:   See Encounters Tab for problem based charting.  Patient discussed with Dr. Beryle Beams

## 2017-06-25 NOTE — Patient Instructions (Signed)
I am going to drop your lisinopril from 30 mg to 5 mg.  Please follow-up with me in [facility] next month for recheck of your blood pressure.  If you have any problems the meantime please let us know.

## 2017-06-26 LAB — BMP8+ANION GAP
ANION GAP: 14 mmol/L (ref 10.0–18.0)
BUN/Creatinine Ratio: 17 (ref 9–20)
BUN: 32 mg/dL — AB (ref 6–24)
CALCIUM: 10.3 mg/dL — AB (ref 8.7–10.2)
CO2: 21 mmol/L (ref 20–29)
Chloride: 105 mmol/L (ref 96–106)
Creatinine, Ser: 1.87 mg/dL — ABNORMAL HIGH (ref 0.76–1.27)
GFR calc Af Amer: 44 mL/min/{1.73_m2} — ABNORMAL LOW (ref >59)
GFR calc non Af Amer: 38 mL/min/{1.73_m2} — ABNORMAL LOW (ref >59)
GLUCOSE: 97 mg/dL (ref 65–99)
POTASSIUM: 5.5 mmol/L — AB (ref 3.5–5.2)
Sodium: 140 mmol/L (ref 134–144)

## 2017-06-29 NOTE — Assessment & Plan Note (Signed)
BP Readings from Last 3 Encounters:  06/25/17 (!) 103/58  12/25/16 129/69  12/11/16 135/70    Lab Results  Component Value Date   NA 140 06/25/2017   K 5.5 (H) 06/25/2017   CREATININE 1.87 (H) 06/25/2017   Patient currently prescribed lisinopril 30 mg daily.  Blood pressure today is low at 103/58.  He denies any symptoms; no lightheadedness, dizziness, vision changes.  Orthostatic vitals today positive for diastolic blood pressure.  He experienced no symptoms going from lying to standing.  Does not check his blood pressure at home.  A/P We will decrease his lisinopril to 5 mg daily and follow-up in a month for a recheck.

## 2017-06-29 NOTE — Assessment & Plan Note (Signed)
Lab Results  Component Value Date   HGBA1C 6.0 06/25/2017   HGBA1C 5.8 (H) 08/15/2016   HGBA1C 5.9 12/20/2015    Patient is currently on no medication is diet controlled.  A1c today is 6.0 from previous 5.8.  He denies any symptoms today including no polyuria or polydipsia.  A/P Continue to monitor at least annually

## 2017-06-29 NOTE — Assessment & Plan Note (Signed)
Patient is currently on citalopram 40 mg daily as well as BuSpar 7.5 mg twice daily for his anxiety.  PHQ 9 today is 12.  He denies any suicidal ideation.    A/P This is a significant change from his prior scores.  He reports that a lot of his issues stem from his ongoing foot pain.  He is been seen by podiatry and started on a short course of hydrocodone for his chronic pain and has been referred to a pain management center by the podiatrist.  He is tried numerous medications including gabapentin, Lyrica, pramipexole, amitriptyline and has not seen any significant improvement in his foot pain.  After discussion with him and his wife he does feel that he is been improved on the citalopram and would not like to change from this.  We will continue citalopram 40 mg daily.  Hopefully with some better control of his pain his depression will improve.

## 2017-06-29 NOTE — Assessment & Plan Note (Signed)
Due for diabetic eye exam.  Patient will contact his ophthalmologist.

## 2017-06-29 NOTE — Assessment & Plan Note (Signed)
Patient continues to smoke half to 1 pack/day.  He tried Chantix with some benefit but has since stopped taking the medication.  He reports his biggest barrier is habit as well as his wife smoking.  A/P Encourage cessation discussed various techniques for help in quitting smoking

## 2017-06-30 NOTE — Progress Notes (Signed)
Medicine attending: Medical history, presenting problems, physical findings, and medications, reviewed with resident physician Dr Nathan Boswell on the day of the patient visit and I concur with his evaluation and management plan. 

## 2017-07-17 ENCOUNTER — Ambulatory Visit: Payer: BLUE CROSS/BLUE SHIELD | Admitting: Cardiovascular Disease

## 2017-07-17 ENCOUNTER — Encounter: Payer: Self-pay | Admitting: Cardiovascular Disease

## 2017-07-17 VITALS — BP 116/70 | HR 75 | Ht 72.0 in | Wt 326.4 lb

## 2017-07-17 DIAGNOSIS — E785 Hyperlipidemia, unspecified: Secondary | ICD-10-CM

## 2017-07-17 DIAGNOSIS — Z720 Tobacco use: Secondary | ICD-10-CM | POA: Diagnosis not present

## 2017-07-17 DIAGNOSIS — I739 Peripheral vascular disease, unspecified: Secondary | ICD-10-CM

## 2017-07-17 DIAGNOSIS — I10 Essential (primary) hypertension: Secondary | ICD-10-CM | POA: Diagnosis not present

## 2017-07-17 DIAGNOSIS — I2721 Secondary pulmonary arterial hypertension: Secondary | ICD-10-CM

## 2017-07-17 NOTE — Progress Notes (Signed)
Cardiology Office Note   Date:  07/17/2017   ID:  [name], DOB 08/16/1955, MRN [id number]  PCP:  [name], MD  Cardiologist:   Skeet Latch, MD   No chief complaint on file.     History of Present Illness: [name] is a 62 y.o. male with hypertension, OSA, diabetes, CKD, prior strokes (2009, 2016), mild carotid stenosis, prior carcinoid tumor in remission, and tobacco abuse who presents for follow up.  He was seen in the hospital 07/2016 for syncope.  The first occurred when getting out of bed and the second occurred while using the restroom. Loss of consciousness was less than 1 minute with each episode. There was no bowel or bladder incontinence or seizure-like activity. His creatinine was mildly elevated on admission. Orthostatics were normal. He had a head CT that showed a small right parietal scalp contusion and H-indeterminate infarcts. Echo that admission revealed LVEF 65-70% with grade 2 diastolic dysfunction and aortic sclerosis without stenosis. On telemetry he was noted to have occasional pauses, the longest being 2.39 seconds. He was referred for a 30 day event monitor 08/2016 that revealed sinus rhythm and sinus arrhythmia with occasional PVCs. There were no significant pauses. Carotid Dopplers revealed 1-39% ICA stenosis bilaterally.  Since his last appointment [name] has been OK.  He continues to struggle with pain in his legs 2/2 neuropathy.  He doesn't get much exercise due to this pain. Lyrica doesn't help.  He also reports pain and occasional swelling in his R LE.  It is worse with ambulation.  He denies chest pain or shortness of breath.  He has been unable to exercise because of the pain.  He cut back on smoking from a pack a daily to 1/2 pack.  He is unsure whether he wants to cut back anymore.  He has not noted any orthopnea or PND.  Lisinopril was recently reduced because his blood pressure was getting low.  Since that time it is  been stable.   Past Medical History:  Diagnosis Date  . Anxiety   . Arthritis    "generalized; not too bad" (02/06/2015)  . Cancer ([place])    NEOPLASIM OF ILEUM  . Cerebrovascular accident ([place]) 11/07/2014  . Chronic kidney disease    abn labs; "related to Metformin I was taking"  . Cough   . CVA (cerebral vascular accident) [facility]) 2009,& September 03, 2014  . Depression   . Diabetes type 2, controlled ([place]) 2004   controlled by lifestyle modification  . Diabetic peripheral neuropathy ([place])   . History of gout   . Hx of adenomatous colonic polyps 01/15/2015  . Hyperlipemia   . Hypertension   . Left sided lacunar stroke [facility])    1st stroke in 2009 2nd stroke July 2016: Acute lacunar type infarct extending from the left corona radiata through the left external capsule Thought to be due to small vessel disease     . OSA on CPAP   . Pneumonia ~ 2013 & 2014 X 2  . Shortness of breath dyspnea   . Syncope 07/2016    Past Surgical History:  Procedure Laterality Date  . APPENDECTOMY  1980's  . BOWEL RESECTION N/A 02/06/2015   Procedure: SMALL BOWEL RESECTION;  Surgeon: Michael Boston, MD;  Location: [facility];  Service: General;  Laterality: N/A;  . CARDIAC CATHETERIZATION  2016  . CAROTID ENDARTERECTOMY Left 11/2007   mptes 6132-11  . HERNIA REPAIR    . KNEE  ARTHROSCOPY Left X 2  . LAPAROSCOPIC ASSISTED VENTRAL HERNIA REPAIR  02/06/2015  . LAPAROSCOPIC SMALL BOWEL RESECTION  02/06/2015  . LAPAROSCOPY N/A 02/06/2015   Procedure: LAPAROSCOPY DIAGNOSTIC;  Surgeon: Michael Boston, MD;  Location: [place];  Service: General;  Laterality: N/A;     Current Outpatient Medications  Medication Sig Dispense Refill  . amitriptyline (ELAVIL) 100 MG tablet Take 1 tablet (100 mg total) by mouth at bedtime. 90 tablet 2  . aspirin EC 81 MG tablet Take 1 tablet (81 mg total) by mouth daily. 90 tablet 3  . atorvastatin (LIPITOR) 40 MG tablet TAKE ONE TABLET BY MOUTH ONCE DAILY AT  6PM 90 tablet 3  . busPIRone  (BUSPAR) 7.5 MG tablet Take 1 tablet (7.5 mg total) by mouth 2 (two) times daily. 180 tablet 2  . citalopram (CELEXA) 40 MG tablet TAKE ONE TABLET BY MOUTH ONCE DAILY 90 tablet 2  . HYDROcodone-ibuprofen (VICOPROFEN) 7.5-200 MG tablet Take 1 tablet by mouth every 6 (six) hours as needed for moderate pain. 20 tablet 0  . lisinopril (PRINIVIL,ZESTRIL) 5 MG tablet Take 1 tablet (5 mg total) by mouth daily. 30 tablet 1  . pregabalin (LYRICA) 150 MG capsule Take 1 capsule (150 mg total) by mouth 2 (two) times daily. 60 capsule 2   No current facility-administered medications for this visit.     Allergies:   Wellbutrin [bupropion]    Social History:  The patient  reports that he has been smoking cigarettes.  He has a 30.00 pack-year smoking history. He has never used smokeless tobacco. He reports that he does not drink alcohol or use drugs.   Family History:  The patient's family history includes Arrhythmia in his mother; Colon polyps in his father and mother; Diabetes in his brother, father, mother, and sister; Heart attack in his father; Heart failure in his father and mother; Hypertension in his brother, father, mother, and sister; Stroke in his father.    ROS:  Please see the history of present illness.   Otherwise, review of systems are positive for none.   All other systems are reviewed and negative.    PHYSICAL EXAM: VS:  BP 116/70   Pulse 75   Ht 6' (1.829 m)   Wt (!) 326 lb 6.4 oz (148.1 kg)   BMI 44.27 kg/m  , BMI Body mass index is 44.27 kg/m. GENERAL:  Well appearing HEENT: Pupils equal round and reactive, fundi not visualized, oral mucosa unremarkable NECK:  No jugular venous distention, waveform within normal limits, carotid upstroke brisk and symmetric, no bruits LUNGS:  Clear to auscultation bilaterally HEART:  RRR.  PMI not displaced or sustained,S1 and S2 within normal limits, no S3, no S4, no clicks, no rubs, no murmurs ABD:  Flat, positive bowel sounds normal in  frequency in pitch, no bruits, no rebound, no guarding, no midline pulsatile mass, no hepatomegaly, no splenomegaly EXT:  2 plus L DP/PD.  Unable to palpate R LE pulses.  Trace edema, no cyanosis no clubbing SKIN:  No rashes no nodules NEURO:  Cranial nerves II through XII grossly intact, motor grossly intact throughout PSYCH:  Cognitively intact, oriented to person place and time   EKG:  EKG is ordered today. 07/17/17: Sinus rhythm.  Rate 75 bpm.  Prior anteroseptal infarct.  First degree AV block.  Low voltage.   Echocardiogram: 08/14/2016 Study Conclusions  - Left ventricle: The cavity size was normal. Wall thickness was increased in a pattern of mild LVH. Systolic function  was vigorous. The estimated ejection fraction was in the range of 65% to 70%. Wall motion was normal; there were no regional wall motion abnormalities. Features are consistent with a pseudonormal left ventricular filling pattern, with concomitant abnormal relaxation and increased filling pressure (grade 2 diastolic dysfunction). - Aortic valve: Trileaflet; moderately calcified leaflets. Sclerosis without stenosis. - Mitral valve: There was no significant regurgitation. - Right ventricle: The cavity size was normal. Systolic function was normal. - Pulmonary arteries: No complete TR doppler jet so unable to estimate PA systolic pressure. - Inferior vena cava: The vessel was normal in size. The respirophasic diameter changes were in the normal range (= 50%), consistent with normal central venous pressure. - Pericardium, extracardiac: A trivial pericardial effusion was identified posterior to the heart.  Impressions:  - Normal LV size with mild LV hypertrophy. Vigorous systolic function, EF 53-61%. Moderate diastolic dysfunction. Normal RV size and systolic function. Aortic valve sclerosis without significant stenosis.  Recent Labs: 08/14/2016: Hemoglobin 14.5; Platelets  171 10/22/2016: ALT 16 06/25/2017: BUN 32; Creatinine, Ser 1.87; Potassium 5.5; Sodium 140    Lipid Panel    Component Value Date/Time   CHOL 157 10/22/2016 1101   TRIG 82 10/22/2016 1101   HDL 38 (L) 10/22/2016 1101   CHOLHDL 4.1 10/22/2016 1101   CHOLHDL 6.5 09/04/2014 0405   VLDL 21 09/04/2014 0405   LDLCALC 103 (H) 10/22/2016 1101      Wt Readings from Last 3 Encounters:  07/17/17 (!) 326 lb 6.4 oz (148.1 kg)  06/25/17 (!) 322 lb 6.4 oz (146.2 kg)  12/25/16 300 lb (136.1 kg)      ASSESSMENT AND PLAN:  # Hypertension: Blood pressure well-controlled on lisinopril. No changes  # Hyperlipidemia:  He will get lipids checked with his PCP next month.    # Bradycardia:  Asymptomatic.  Avoid nodal agents.   # Prior stroke:  # Mild carotid stenosis: Continue aspirin and atorvastatin.  LDL should be <70.  # Tobacco abuse: [name] is not ready to quit.  Encouraged him and his wife to cut back by 1 cigarette every 2 weeks.  He will consider this.  Chantix did not help.  Smoking cessation discussed for 5 min.  # Claudication: R LE pain.  Check ABIs.  Current medicines are reviewed at length with the patient today.  The patient does not have concerns regarding medicines.  The following changes have been made:  no change  Labs/ tests ordered today include:   Orders Placed This Encounter  Procedures  . EKG 12-Lead     Disposition:   FU with Eleasha Cataldo C. Oval Linsey, MD, [facility] in 6 months.       Signed, Arkeem Harts C. Oval Linsey, MD, [facility]  07/17/2017 9:57 AM    [name]

## 2017-07-17 NOTE — Patient Instructions (Addendum)
Medication Instructions:  Your physician recommends that you continue on your current medications as directed. Please refer to the Current Medication list given to you today.  Labwork: NONE  Testing/Procedures: Your physician has requested that you have an ankle brachial index (ABI). During this test an ultrasound and blood pressure cuff are used to evaluate the arteries that supply the arms and legs with blood. Allow thirty minutes for this exam. There are no restrictions or special instructions.  Your physician has requested that you have a lower or upper extremity arterial duplex. This test is an ultrasound of the arteries in the legs or arms. It looks at arterial blood flow in the legs and arms. Allow one hour for Lower and Upper Arterial scans. There are no restrictions or special instructions LOWER   Follow-Up: Your physician wants you to follow-up in: 6 MONTHS  You will receive a reminder letter in the mail two months in advance. If you don't receive a letter, please call our office to schedule the follow-up appointment.  If you need a refill on your cardiac medications before your next appointment, please call your pharmacy.   Ankle-Brachial Index Test The ankle-brachial index (ABI) test is used to find peripheral vascular disease (PVD). PVD is also known as peripheral arterial disease (PAD). PVD is the blocking or hardening of the arteries anywhere within the circulatory system beyond the heart. PVD is caused by cholesterol deposits in your blood vessels (atherosclerosis). These deposits cause arteries to narrow. The delivery of oxygen to your tissues is impaired as a result. This can cause muscle pain and fatigue. This is called claudication. PVD means there may also be buildup of cholesterol in your:  Heart. This increases the risk of heart attacks.  Brain. This increases the risk of strokes.  The ankle-brachial index test measures the blood flow in your arms and legs. This test  also determines if blood vessels in your leg are narrowed by cholesterol deposits. There are additional causes of a reduced ankle-brachial index, such as inflammation of vessels or a clot in the vessels. However, these are much less common than narrowing due to cholesterol deposits. What is being tested? The test is done while you are lying down and resting. Measurements are taken of the systolic pressure:  In your arm (brachial).  In your ankle at several points along your leg.  Systolic pressure is the pressure inside your arteries when your heart pumps. The measurements are taken several times on both sides. Then, the highest systolic pressure of the ankle is divided by the highest brachial systolic pressure. The result is the ankle-brachial pressure ratio, or ABI. Sometimes this test is repeated after you have exercised on a treadmill for five minutes. You may have leg pain during the exercise portion of the test if you suffer from PAD. If the index number drops after exercise, this may show that PAD is present. A normal ABI ratio is between 0.9 and 1.4. A value below 0.9 is considered abnormal. This information is not intended to replace advice given to you by your health care provider. Make sure you discuss any questions you have with your health care provider. Document Released: 02/09/2004 Document Revised: 07/13/2015 Document Reviewed: 09/10/2013 Elsevier Interactive Patient Education  [employer].

## 2017-07-25 ENCOUNTER — Ambulatory Visit (HOSPITAL_COMMUNITY)
Admission: RE | Admit: 2017-07-25 | Discharge: 2017-07-25 | Disposition: A | Discharge status: Home / Self Care | Payer: BLUE CROSS/BLUE SHIELD | Source: Ambulatory Visit | Attending: Cardiology | Admitting: Cardiology

## 2017-07-25 DIAGNOSIS — R9439 Abnormal result of other cardiovascular function study: Secondary | ICD-10-CM | POA: Insufficient documentation

## 2017-07-25 DIAGNOSIS — I739 Peripheral vascular disease, unspecified: Secondary | ICD-10-CM | POA: Diagnosis not present

## 2017-08-17 ENCOUNTER — Encounter: Payer: Self-pay | Admitting: *Deleted

## 2017-08-20 ENCOUNTER — Ambulatory Visit: Payer: BLUE CROSS/BLUE SHIELD | Admitting: Podiatry

## 2017-08-20 ENCOUNTER — Encounter: Payer: Self-pay | Admitting: Podiatry

## 2017-08-20 VITALS — Temp 98.5°F

## 2017-08-20 DIAGNOSIS — E0842 Diabetes mellitus due to underlying condition with diabetic polyneuropathy: Secondary | ICD-10-CM

## 2017-08-20 DIAGNOSIS — B353 Tinea pedis: Secondary | ICD-10-CM

## 2017-08-20 MED ORDER — TERBINAFINE HCL 250 MG PO TABS: 250.0000 mg | ORAL_TABLET | Freq: Every day | ORAL | 0 refills | Status: AC

## 2017-08-20 NOTE — Progress Notes (Signed)
Subjective: 62 y.o. year old male patient presents complaining of red skin lesions on left foot.  His wife noted the lesion 4 days ago.  Objective: Dermatologic: Thick yellow deformed nails mildly hypertrophic. Red dry blistered skin over great toe left. Macerated and oozing skin in between lesser digits left foot. Vascular: Pedal pulses are all palpable. Orthopedic: Tight interdigital spaces bilateral. Neurologic: Diagnosed with Diabetic neuropathy.  Assessment: Acute skin blister with macerated inter digital space left foot. Tinea pedis. Diaagnosed with Diabetic neuropathy.  Treatment: Reviewed findings and available treatment options. Lamisil prescribed. Instructed to soak in Vinegar water, use Tinactin foot powder after dry them out and place small pieces of cotton balls. Return in 2 weeks.

## 2017-08-20 NOTE — Patient Instructions (Addendum)
Seen for skin lesion on left foot over the big toe and 2nd inter digital space. Possible acute fungal infection. Take Lamisil pill one a day. Soak in Vinegar water for 15 minutes, then dry well between the toes. Apply Tinactic foot power and place a small piece of Cotton ball to aerate the area. Use open toed Sandals as often as possible.  All nails debrided. Return in 2 weeks.

## 2017-08-23 ENCOUNTER — Other Ambulatory Visit: Payer: Self-pay | Admitting: Internal Medicine

## 2017-08-23 DIAGNOSIS — I10 Essential (primary) hypertension: Secondary | ICD-10-CM

## 2017-08-23 DIAGNOSIS — E1140 Type 2 diabetes mellitus with diabetic neuropathy, unspecified: Secondary | ICD-10-CM

## 2017-09-03 ENCOUNTER — Encounter: Payer: Self-pay | Admitting: Podiatry

## 2017-09-03 ENCOUNTER — Ambulatory Visit: Payer: BLUE CROSS/BLUE SHIELD | Admitting: Podiatry

## 2017-09-03 DIAGNOSIS — E0842 Diabetes mellitus due to underlying condition with diabetic polyneuropathy: Secondary | ICD-10-CM

## 2017-09-03 DIAGNOSIS — B353 Tinea pedis: Secondary | ICD-10-CM

## 2017-09-03 MED ORDER — AMOXICILLIN-POT CLAVULANATE 875-125 MG PO TABS: 1.0000 | ORAL_TABLET | Freq: Two times a day (BID) | ORAL | 0 refills | Status: AC

## 2017-09-03 NOTE — Progress Notes (Signed)
Subjective: 62 y.o. year old male patient accompanied by his mother presents for follow up on inter digital skin lesion left foot. Been taking Lamisil as prescribed for the last 2 weeks. Patient states the foot is sore.   Objective: Dermatologic: Dry thick crust in between the first web space left foot. Forefoot is mildly erythematous. No active drainage noted. Vascular: Pedal pulses are all palpable. Orthopedic: No gross deformities. Neurologic: All epicritic and tactile sensations grossly intact.  Assessment: Acute dermatitis left interdigital space. Possible Tinea pedis.  Treatment: Continue with Vinegar soak and wash. Continue with Lamisil. Take new antibiotics. Ok to use Antifungal cream on hard crusted area. Return in 2 weeks.

## 2017-09-03 NOTE — Patient Instructions (Addendum)
Follow up on left foot wound in between digits. Noted of crusted lesions with mild forefoot erythema. Continue with Vinegar soak and wash. Keep the area dry. Continue with Lamisil. Take new antibiotics. Ok to use Antifungal cream on hard crusted area. Return in 2-3 weeks.

## 2017-09-22 ENCOUNTER — Other Ambulatory Visit: Payer: Self-pay | Admitting: *Deleted

## 2017-09-22 DIAGNOSIS — E1140 Type 2 diabetes mellitus with diabetic neuropathy, unspecified: Secondary | ICD-10-CM

## 2017-09-22 MED ORDER — PREGABALIN 150 MG PO CAPS: 150.0000 mg | ORAL_CAPSULE | Freq: Two times a day (BID) | ORAL | 0 refills | Status: DC

## 2017-09-24 ENCOUNTER — Ambulatory Visit: Payer: BLUE CROSS/BLUE SHIELD | Admitting: Podiatry

## 2017-09-25 ENCOUNTER — Ambulatory Visit: Payer: BLUE CROSS/BLUE SHIELD | Admitting: Podiatry

## 2017-10-01 ENCOUNTER — Encounter: Payer: Self-pay | Admitting: Podiatry

## 2017-10-01 ENCOUNTER — Ambulatory Visit: Payer: BLUE CROSS/BLUE SHIELD | Admitting: Podiatry

## 2017-10-01 DIAGNOSIS — B351 Tinea unguium: Secondary | ICD-10-CM

## 2017-10-01 DIAGNOSIS — E0842 Diabetes mellitus due to underlying condition with diabetic polyneuropathy: Secondary | ICD-10-CM | POA: Diagnosis not present

## 2017-10-01 DIAGNOSIS — B353 Tinea pedis: Secondary | ICD-10-CM

## 2017-10-01 DIAGNOSIS — M79672 Pain in left foot: Secondary | ICD-10-CM | POA: Diagnosis not present

## 2017-10-01 DIAGNOSIS — M79671 Pain in right foot: Secondary | ICD-10-CM | POA: Diagnosis not present

## 2017-10-01 NOTE — Patient Instructions (Signed)
Follow up on interdigital ulcer. Making improvement and still has open lesion at 2nd interdigital space left foot. All nails need trimming. All nails and calluses debrided. Rx for Antibiotic soak placed. Return in 2 month for follow up.

## 2017-10-01 NOTE — Progress Notes (Signed)
Subjective: 62 y.o. year old male patient presents for follow up on interdigital ulceration. He is accompanied by his wife.  HPI: He has developed acute dermatitis with maceration at interdigital spaces both feet in early July, 2019. Been treated with Lamisil Pills x 3 weeks, Vinegar soaks, another dose of antibiotics, antifungal creams for the last 4 weeks.   Objective: Dermatologic: Thick dystrophic nails x 10. All other interdigital spaces are cleared from lesion except the 2nd interspace left foot. Macerated 2nd web space with crust and clear drainage left foot. Vascular: Pedal pulses are all palpable. No edema or associated ascending cellulitis noted. Orthopedic: No gross deformities. Neurologic: Diagnosed with Diabetic polyneuropathy.  Assessment: Overall improvement noted at the interdigital spaces compared with the last visit 2 weeks ago. Acute dermatitis interdigital space with mixed Tinea pedis infection. Macerated interdigital space 2nd left. Diabetic polyneuropathy.  Treatment: All mycotic nails, corns, calluses debrided.  Rx. Antibiotic foot soak prescribed. Instructed to re order the supply if the lesion is still open after 4 weeks treatment. Return in 2 month or sooner if needed.

## 2017-10-24 ENCOUNTER — Other Ambulatory Visit: Payer: Self-pay | Admitting: Internal Medicine

## 2017-10-24 ENCOUNTER — Other Ambulatory Visit: Payer: Self-pay | Admitting: Oncology

## 2017-10-24 DIAGNOSIS — F32A Depression, unspecified: Secondary | ICD-10-CM

## 2017-10-24 DIAGNOSIS — E1140 Type 2 diabetes mellitus with diabetic neuropathy, unspecified: Secondary | ICD-10-CM

## 2017-10-24 DIAGNOSIS — I10 Essential (primary) hypertension: Secondary | ICD-10-CM

## 2017-10-24 DIAGNOSIS — F329 Major depressive disorder, single episode, unspecified: Secondary | ICD-10-CM

## 2017-10-29 NOTE — Telephone Encounter (Signed)
Dr. Dareen Piano would you mind helping me refill this prescription? I still am not authorized to e-prescribe control substances and am having issues getting in touch with this pharmacy. Thanks!

## 2017-10-30 NOTE — Telephone Encounter (Signed)
No problem. I filled it.

## 2017-11-20 ENCOUNTER — Other Ambulatory Visit: Payer: Self-pay | Admitting: Internal Medicine

## 2017-11-20 DIAGNOSIS — E1140 Type 2 diabetes mellitus with diabetic neuropathy, unspecified: Secondary | ICD-10-CM

## 2017-11-21 NOTE — Telephone Encounter (Signed)
No worries. I signed it

## 2017-11-21 NOTE — Telephone Encounter (Signed)
Dr. Dareen Piano,   Do you mind helping me fill this prescription one more time? I am so sorry to ask you to help me prescribe lyrica for this patient again -- I am going to get set up for e-prescribing Monday!

## 2017-12-01 ENCOUNTER — Ambulatory Visit: Payer: BLUE CROSS/BLUE SHIELD | Admitting: Podiatry

## 2017-12-09 ENCOUNTER — Ambulatory Visit: Payer: BLUE CROSS/BLUE SHIELD | Admitting: Podiatry

## 2017-12-09 ENCOUNTER — Encounter: Payer: Self-pay | Admitting: Podiatry

## 2017-12-09 DIAGNOSIS — B351 Tinea unguium: Secondary | ICD-10-CM

## 2017-12-09 DIAGNOSIS — E0842 Diabetes mellitus due to underlying condition with diabetic polyneuropathy: Secondary | ICD-10-CM

## 2017-12-09 NOTE — Progress Notes (Signed)
Subjective: 62 y.o. year old male patient presents accompanied by his wife for follow up on interdigital lesion and problematic toe nails.  Stated that he was using antibiotic foot soak for the past 3 weeks, which was prescribed last month.   HPI: He has developed acute dermatitis with maceration at interdigital spaces both feet in early July, 2019. Been treated with Lamisil Pills x 3 weeks, Vinegar soaks, another dose of antibiotics, antifungal creams for the last 4 weeks.   Objective: Dermatologic: Thick dystrophic nails x 10. Clean interdigital spaces except the 2nd interspace left foot that has residual scab, 0.5 x 1 cm at distal dorsal edge.  2nd web space crust is free of drainage or erythema.  Vascular: Pedal pulses are all palpable. No edema or associated ascending cellulitis noted. Orthopedic: No gross deformities. Neurologic: Diagnosed with Diabetic polyneuropathy.  Assessment: Improving Macerated interdigital space 2nd left with residual crust. Mycotic nails x 10. Diabetic polyneuropathy.  Treatment: All mycotic nails debrided.  Continue with foot soak. Advised to seek another podiatrist for continuation of foot care.

## 2017-12-09 NOTE — Patient Instructions (Signed)
Seen for hypertrophic nails. All nails debrided. Interdigital lesions are healing well. Continue current level of care.

## 2017-12-12 ENCOUNTER — Other Ambulatory Visit: Payer: Self-pay | Admitting: *Deleted

## 2017-12-12 DIAGNOSIS — F32A Depression, unspecified: Secondary | ICD-10-CM

## 2017-12-12 DIAGNOSIS — F329 Major depressive disorder, single episode, unspecified: Secondary | ICD-10-CM

## 2017-12-12 MED ORDER — BUSPIRONE HCL 7.5 MG PO TABS: 7.5000 mg | ORAL_TABLET | Freq: Two times a day (BID) | ORAL | 2 refills | Status: AC

## 2017-12-25 ENCOUNTER — Other Ambulatory Visit: Payer: Self-pay | Admitting: *Deleted

## 2017-12-25 DIAGNOSIS — E1140 Type 2 diabetes mellitus with diabetic neuropathy, unspecified: Secondary | ICD-10-CM

## 2017-12-25 DIAGNOSIS — I10 Essential (primary) hypertension: Secondary | ICD-10-CM

## 2017-12-25 MED ORDER — LISINOPRIL 5 MG PO TABS: 5.0000 mg | ORAL_TABLET | Freq: Every day | ORAL | 1 refills | Status: DC

## 2018-01-08 ENCOUNTER — Other Ambulatory Visit: Payer: Self-pay | Admitting: Internal Medicine

## 2018-01-08 DIAGNOSIS — E1140 Type 2 diabetes mellitus with diabetic neuropathy, unspecified: Secondary | ICD-10-CM

## 2018-01-08 MED ORDER — PREGABALIN 150 MG PO CAPS: 150.0000 mg | ORAL_CAPSULE | Freq: Two times a day (BID) | ORAL | 0 refills | Status: DC

## 2018-02-07 ENCOUNTER — Other Ambulatory Visit: Payer: Self-pay | Admitting: Internal Medicine

## 2018-02-07 DIAGNOSIS — E1140 Type 2 diabetes mellitus with diabetic neuropathy, unspecified: Secondary | ICD-10-CM

## 2018-02-21 ENCOUNTER — Other Ambulatory Visit: Payer: Self-pay | Admitting: Internal Medicine

## 2018-02-21 DIAGNOSIS — I10 Essential (primary) hypertension: Secondary | ICD-10-CM

## 2018-02-21 DIAGNOSIS — E1140 Type 2 diabetes mellitus with diabetic neuropathy, unspecified: Secondary | ICD-10-CM

## 2018-02-24 ENCOUNTER — Other Ambulatory Visit: Payer: Self-pay | Admitting: Internal Medicine

## 2018-02-24 DIAGNOSIS — E1140 Type 2 diabetes mellitus with diabetic neuropathy, unspecified: Secondary | ICD-10-CM

## 2018-02-24 DIAGNOSIS — I10 Essential (primary) hypertension: Secondary | ICD-10-CM

## 2018-02-24 MED ORDER — LISINOPRIL 5 MG PO TABS: 5.0000 mg | ORAL_TABLET | Freq: Every day | ORAL | 3 refills | Status: AC

## 2018-02-24 MED ORDER — ATORVASTATIN CALCIUM 40 MG PO TABS: ORAL_TABLET | ORAL | 0 refills | Status: DC

## 2018-03-21 ENCOUNTER — Other Ambulatory Visit: Payer: Self-pay | Admitting: Internal Medicine

## 2018-03-21 DIAGNOSIS — E1140 Type 2 diabetes mellitus with diabetic neuropathy, unspecified: Secondary | ICD-10-CM

## 2018-03-23 ENCOUNTER — Telehealth: Payer: Self-pay | Admitting: Internal Medicine

## 2018-03-23 NOTE — Telephone Encounter (Signed)
Patient is overdue for checkup with pcp.  Called to schedule an appt for this Wednesday but no answer.  Left detailed message asking to please all back to make an appt.

## 2018-05-26 ENCOUNTER — Telehealth: Payer: Self-pay | Admitting: *Deleted

## 2018-05-26 NOTE — Telephone Encounter (Signed)
Patient called from recall list, past due for follow up visit with Dr [place] Edwards Called to try to arrange virtual visit. Left message to call back   

## 2018-05-26 NOTE — Telephone Encounter (Signed)
Left message to call back  

## 2018-05-26 NOTE — Telephone Encounter (Signed)
Follow Up:   Pt's wife returning your call.

## 2018-06-06 ENCOUNTER — Other Ambulatory Visit: Payer: Self-pay | Admitting: Internal Medicine

## 2018-06-06 DIAGNOSIS — E1140 Type 2 diabetes mellitus with diabetic neuropathy, unspecified: Secondary | ICD-10-CM

## 2018-06-24 NOTE — Telephone Encounter (Signed)
Left message to call back  Patient needs follow up with PA/NP or Dr Oval Linsey

## 2018-07-14 ENCOUNTER — Telehealth: Payer: Self-pay | Admitting: Cardiology

## 2018-07-14 NOTE — Telephone Encounter (Signed)
Spoke w/pts spouse, because we are out of network, they want to cancel appt, they do not want to create a bill that they will not be able to pay. Will sent a message to [facility] to let her know. ttf

## 2018-07-15 NOTE — Telephone Encounter (Signed)
Left detailed message on patients machine informing them that I would cancel his appt with [facility] tomorrow. And stated our office would be happy to send his records to his cardiologist. Advised pt to contact office with any questions or concerns.

## 2018-07-16 ENCOUNTER — Telehealth: Payer: Self-pay | Admitting: Cardiology

## 2018-07-19 ENCOUNTER — Other Ambulatory Visit: Payer: Self-pay | Admitting: Internal Medicine

## 2018-07-19 DIAGNOSIS — E1140 Type 2 diabetes mellitus with diabetic neuropathy, unspecified: Secondary | ICD-10-CM

## 2018-07-19 DIAGNOSIS — I10 Essential (primary) hypertension: Secondary | ICD-10-CM

## 2018-07-20 NOTE — Telephone Encounter (Signed)
When he sch appt, I will refill to get him to his appt

## 2018-07-22 NOTE — Telephone Encounter (Signed)
[name]  Unable to reach patient, left message  Thanks  [name]

## 2018-08-03 ENCOUNTER — Other Ambulatory Visit: Payer: Self-pay | Admitting: *Deleted

## 2018-08-03 NOTE — Telephone Encounter (Signed)
Called pt - no answer; Donna's recording on vm, left message to call the office to schedule an appt.  Received refill requests from [facility].

## 2018-08-18 NOTE — Telephone Encounter (Signed)
Opened in error

## 2018-12-21 DIAGNOSIS — Z23 Encounter for immunization: Secondary | ICD-10-CM | POA: Diagnosis not present

## 2018-12-21 DIAGNOSIS — E7800 Pure hypercholesterolemia, unspecified: Secondary | ICD-10-CM | POA: Diagnosis not present

## 2018-12-21 DIAGNOSIS — E1140 Type 2 diabetes mellitus with diabetic neuropathy, unspecified: Secondary | ICD-10-CM | POA: Diagnosis not present

## 2018-12-21 DIAGNOSIS — I10 Essential (primary) hypertension: Secondary | ICD-10-CM | POA: Diagnosis not present

## 2018-12-21 DIAGNOSIS — I6930 Unspecified sequelae of cerebral infarction: Secondary | ICD-10-CM | POA: Diagnosis not present

## 2019-05-01 ENCOUNTER — Other Ambulatory Visit: Payer: Self-pay | Admitting: Family Medicine

## 2019-05-01 DIAGNOSIS — Z87891 Personal history of nicotine dependence: Secondary | ICD-10-CM

## 2019-07-23 ENCOUNTER — Ambulatory Visit
Admission: RE | Admit: 2019-07-23 | Discharge: 2019-07-23 | Disposition: A | Discharge status: Home / Self Care | Payer: BLUE CROSS/BLUE SHIELD | Source: Ambulatory Visit | Attending: Family Medicine | Admitting: Family Medicine

## 2019-07-23 DIAGNOSIS — Z87891 Personal history of nicotine dependence: Secondary | ICD-10-CM

## 2020-06-13 ENCOUNTER — Encounter: Payer: Self-pay | Admitting: Internal Medicine

## 2020-10-25 IMAGING — CT CT CHEST LUNG CANCER SCREENING LOW DOSE W/O CM
2 of 5 series · 15 of 40 positions shown, 18 images · non-contrast
Comparison: CT chest dated 10/04/2014

CLINICAL DATA: 63-year-old male current smoker, with 47 pack-year
history of smoking

EXAM:
CT CHEST WITHOUT CONTRAST LOW-DOSE FOR LUNG CANCER SCREENING
TECHNIQUE: Multidetector CT imaging of the chest was performed following the
standard protocol without IV contrast.

[Series 4: lung 1.00 br44 cor · coronal · 0.69mm/px · 3 of 446 slices shown]
[im 90/446  lung]
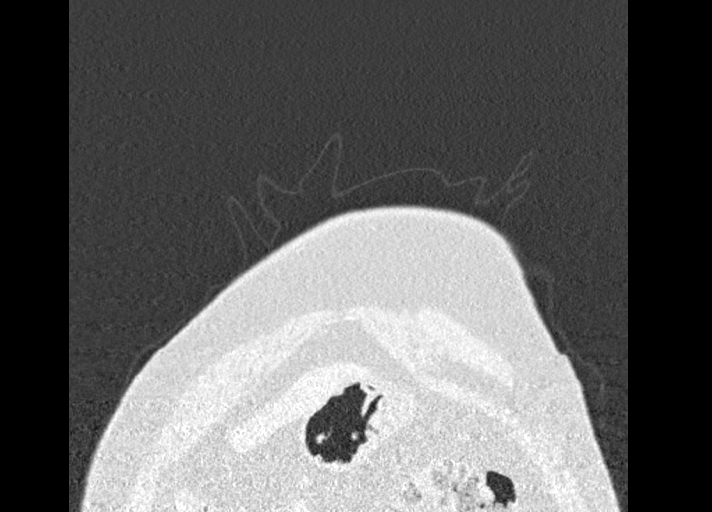
[im 179/446  lung]
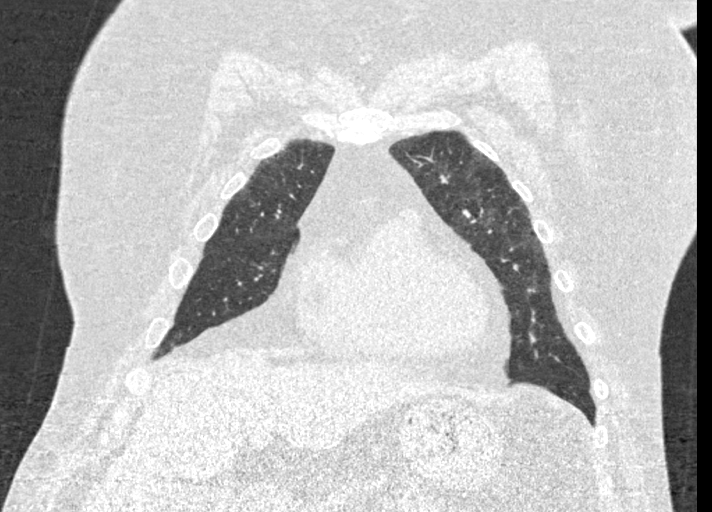
[im 268/446  lung]
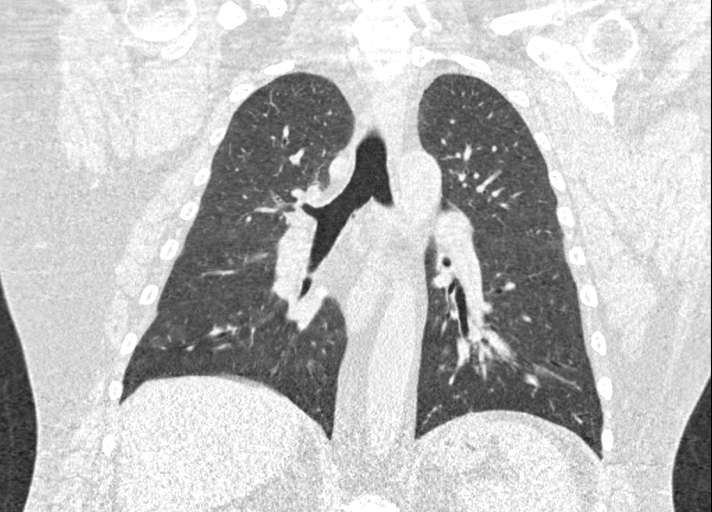

[Series 9: lung 1.00 br60 axial · axial · 0.96mm/px · z∈[-1278,-960]mm · 12 of 352 slices shown, 15 images]
[im 17/352  mediastinal]
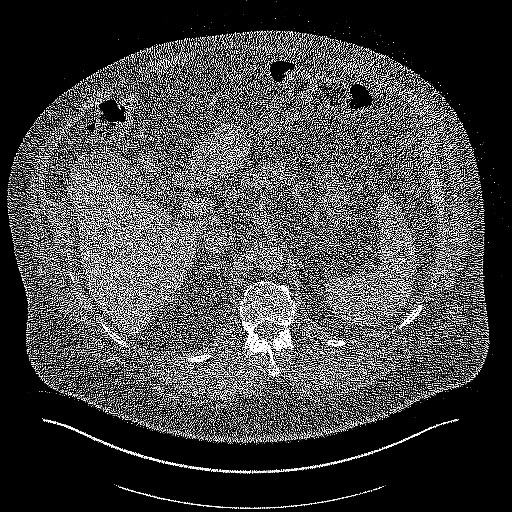
[im 17/352  lung]
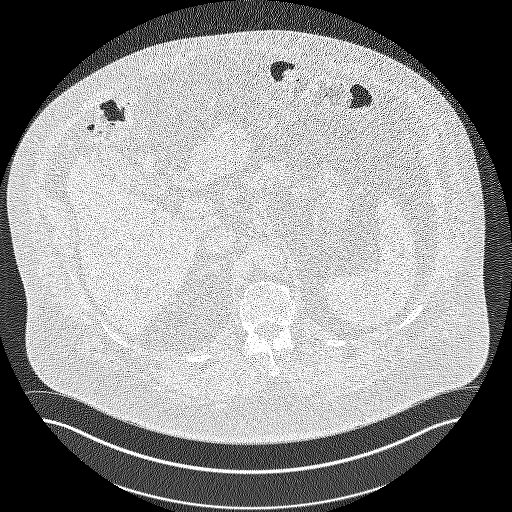
[im 51/352  lung]
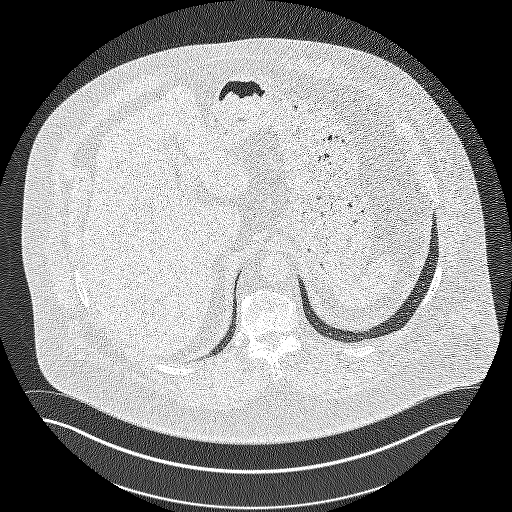
[im 84/352  lung]
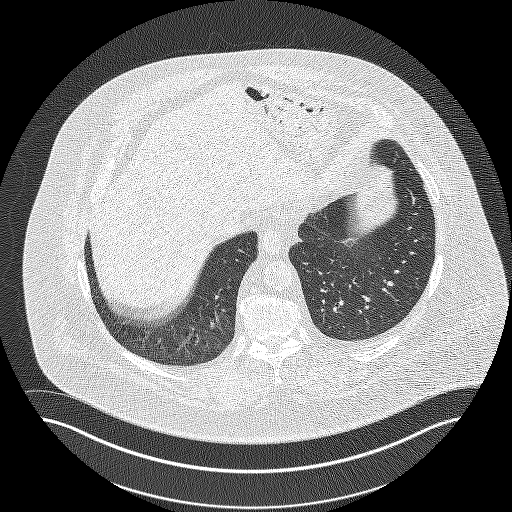
[im 101/352  lung]
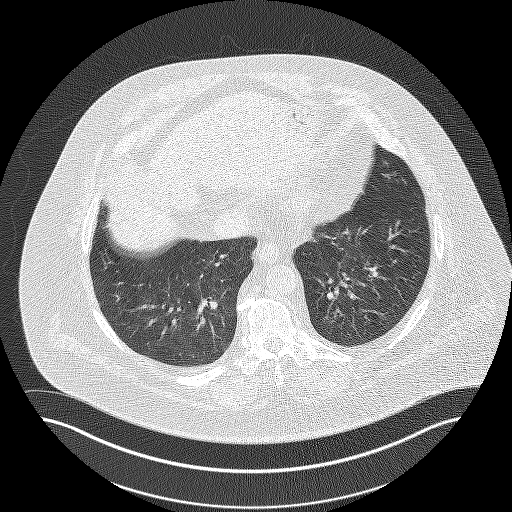
[im 134/352  mediastinal]
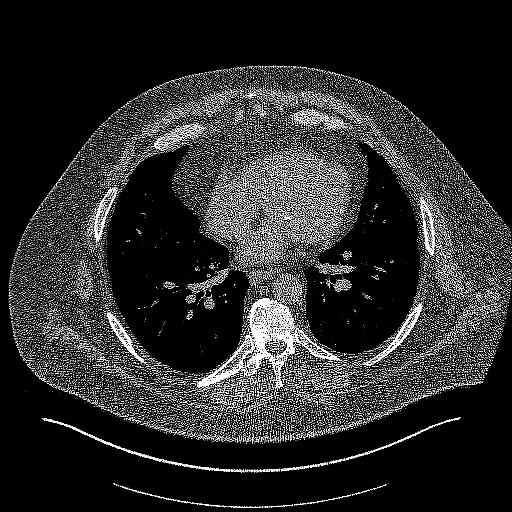
[im 134/352  lung]
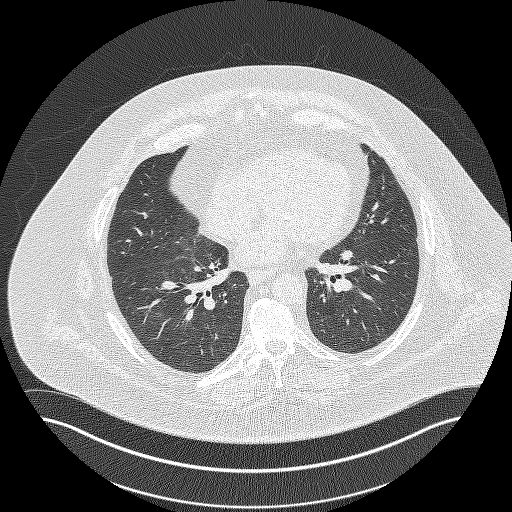
[im 168/352  lung]
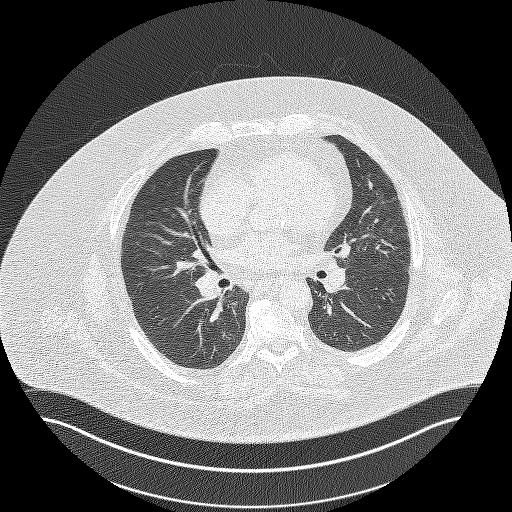
[im 184/352  lung]
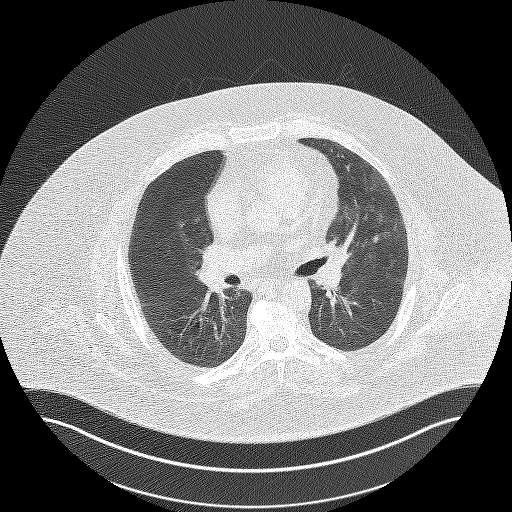
[im 218/352  lung]
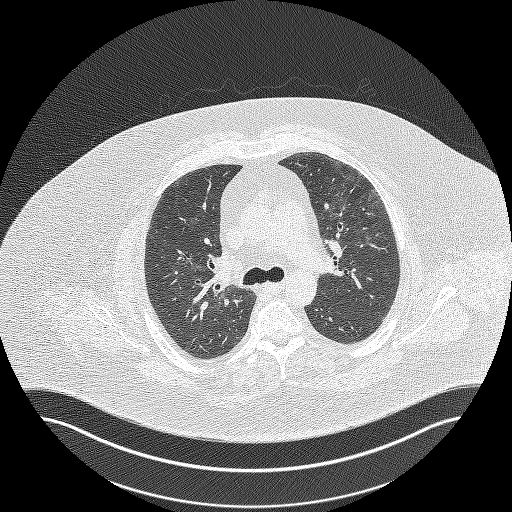
[im 251/352  mediastinal]
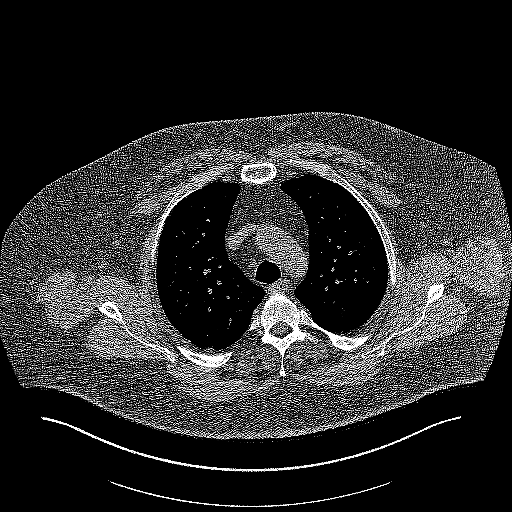
[im 251/352  lung]
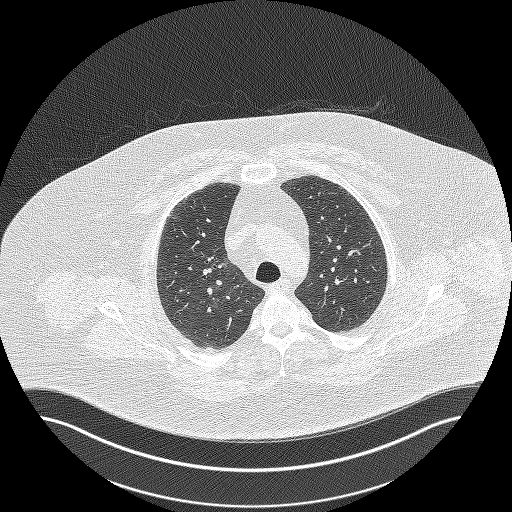
[im 268/352  lung]
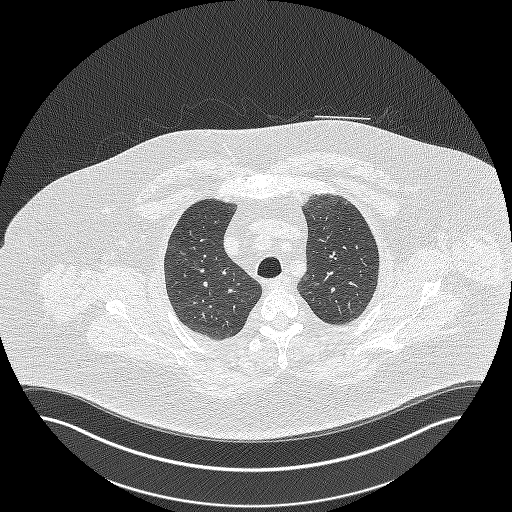
[im 301/352  lung]
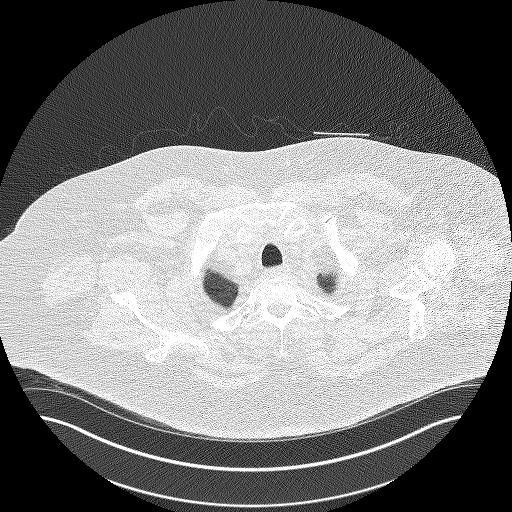
[im 335/352  lung]
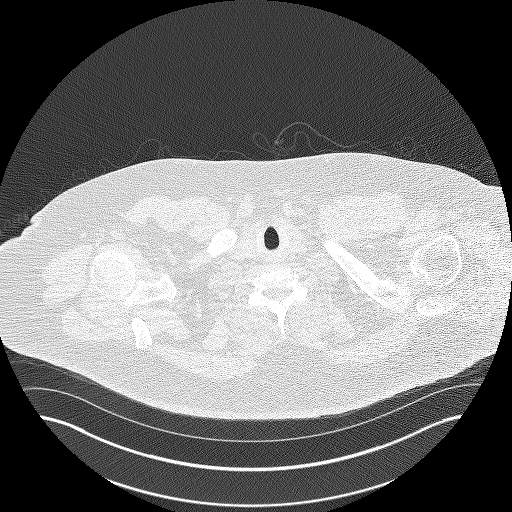

[15 of 40 positions shown; findings below may reference images not displayed]

FINDINGS: Cardiovascular: The heart is normal in size. No pericardial
effusion.

No evidence of thoracic aortic aneurysm. Mild atherosclerotic
calcifications of the aortic arch.

Mediastinum/Nodes: No suspicious mediastinal lymphadenopathy.

Visualized thyroid is unremarkable.

Lungs/Pleura: Mild peribronchovascular ground-glass opacities in the
lungs bilaterally, nonspecific. A combination of post
infectious/inflammatory scarring and air trapping is favored.

Evaluation of the lung parenchyma is constrained by respiratory
motion. Within that constraint, there are no suspicious pulmonary
nodules. Specifically, the 2 mm subpleural right lower lobe nodules
noted on the prior are not currently visualized.

No focal consolidation.

No pleural effusion or pneumothorax.

Upper Abdomen: Visualized upper abdomen is grossly unremarkable.

Musculoskeletal: Degenerative changes of the visualized
thoracolumbar spine.
IMPRESSION: Lung-RADS 1, negative. Continue annual screening with low-dose chest
CT without contrast in 12 months.

Aortic Atherosclerosis (11PQ4-9ME.E).

## 2021-06-18 ENCOUNTER — Other Ambulatory Visit: Payer: Self-pay | Admitting: Family Medicine

## 2021-06-18 DIAGNOSIS — Z87891 Personal history of nicotine dependence: Secondary | ICD-10-CM

## 2021-08-27 ENCOUNTER — Ambulatory Visit
Admission: RE | Admit: 2021-08-27 | Discharge: 2021-08-27 | Disposition: A | Discharge status: Home / Self Care | Payer: Commercial Managed Care - HMO | Source: Ambulatory Visit | Attending: Family Medicine | Admitting: Family Medicine

## 2021-08-27 DIAGNOSIS — Z87891 Personal history of nicotine dependence: Secondary | ICD-10-CM

## 2022-06-07 DIAGNOSIS — Z125 Encounter for screening for malignant neoplasm of prostate: Secondary | ICD-10-CM | POA: Diagnosis not present

## 2022-06-07 DIAGNOSIS — I700 Atherosclerosis of aorta: Secondary | ICD-10-CM | POA: Diagnosis not present

## 2022-06-07 DIAGNOSIS — E7800 Pure hypercholesterolemia, unspecified: Secondary | ICD-10-CM | POA: Diagnosis not present

## 2022-06-07 DIAGNOSIS — M109 Gout, unspecified: Secondary | ICD-10-CM | POA: Diagnosis not present

## 2022-06-07 DIAGNOSIS — I10 Essential (primary) hypertension: Secondary | ICD-10-CM | POA: Diagnosis not present

## 2022-06-07 DIAGNOSIS — E1140 Type 2 diabetes mellitus with diabetic neuropathy, unspecified: Secondary | ICD-10-CM | POA: Diagnosis not present

## 2022-06-07 DIAGNOSIS — Z6841 Body Mass Index (BMI) 40.0 and over, adult: Secondary | ICD-10-CM | POA: Diagnosis not present

## 2022-06-07 DIAGNOSIS — I739 Peripheral vascular disease, unspecified: Secondary | ICD-10-CM | POA: Diagnosis not present

## 2022-06-07 DIAGNOSIS — I69351 Hemiplegia and hemiparesis following cerebral infarction affecting right dominant side: Secondary | ICD-10-CM | POA: Diagnosis not present

## 2022-06-07 DIAGNOSIS — F418 Other specified anxiety disorders: Secondary | ICD-10-CM | POA: Diagnosis not present

## 2022-06-07 DIAGNOSIS — N1832 Chronic kidney disease, stage 3b: Secondary | ICD-10-CM | POA: Diagnosis not present

## 2022-08-20 ENCOUNTER — Telehealth: Payer: Self-pay

## 2022-08-20 NOTE — Patient Outreach (Signed)
  Care Coordination   08/20/2022 Name: DONNIVIN CUPIT MRN: [id number] DOB: 08-02-1955   Care Coordination Outreach Attempts:  An unsuccessful telephone outreach was attempted today to offer the patient information about available care coordination services.  Follow Up Plan:  Additional outreach attempts will be made to offer the patient care coordination information and services.   Encounter Outcome:  No Answer   Care Coordination Interventions:  No, not indicated    Rowe Pavy, RN, BSN, [facility] [employer] [phone]

## 2022-08-21 ENCOUNTER — Telehealth: Payer: Self-pay

## 2022-08-21 NOTE — Patient Outreach (Signed)
  Care Coordination   08/21/2022 Name: [name] MRN: [id number] DOB: [birth year]   Care Coordination Outreach Attempts:  A second unsuccessful outreach was attempted today to offer the patient with information about available care coordination services.  Follow Up Plan:  Additional outreach attempts will be made to offer the patient care coordination information and services.   Encounter Outcome:  No Answer   Care Coordination Interventions:  No, not indicated    Rowe Pavy, RN, BSN, [facility] [employer] [phone]

## 2022-12-13 DIAGNOSIS — E1140 Type 2 diabetes mellitus with diabetic neuropathy, unspecified: Secondary | ICD-10-CM | POA: Diagnosis not present

## 2022-12-13 DIAGNOSIS — M109 Gout, unspecified: Secondary | ICD-10-CM | POA: Diagnosis not present

## 2022-12-13 DIAGNOSIS — E7800 Pure hypercholesterolemia, unspecified: Secondary | ICD-10-CM | POA: Diagnosis not present

## 2022-12-13 DIAGNOSIS — Z9181 History of falling: Secondary | ICD-10-CM | POA: Diagnosis not present

## 2022-12-13 DIAGNOSIS — I700 Atherosclerosis of aorta: Secondary | ICD-10-CM | POA: Diagnosis not present

## 2022-12-13 DIAGNOSIS — I739 Peripheral vascular disease, unspecified: Secondary | ICD-10-CM | POA: Diagnosis not present

## 2022-12-13 DIAGNOSIS — I10 Essential (primary) hypertension: Secondary | ICD-10-CM | POA: Diagnosis not present

## 2022-12-13 DIAGNOSIS — N1832 Chronic kidney disease, stage 3b: Secondary | ICD-10-CM | POA: Diagnosis not present

## 2022-12-13 DIAGNOSIS — Z23 Encounter for immunization: Secondary | ICD-10-CM | POA: Diagnosis not present

## 2022-12-13 DIAGNOSIS — I69351 Hemiplegia and hemiparesis following cerebral infarction affecting right dominant side: Secondary | ICD-10-CM | POA: Diagnosis not present

## 2022-12-25 DIAGNOSIS — R053 Chronic cough: Secondary | ICD-10-CM | POA: Diagnosis not present

## 2022-12-25 DIAGNOSIS — J439 Emphysema, unspecified: Secondary | ICD-10-CM | POA: Diagnosis not present

## 2022-12-30 ENCOUNTER — Other Ambulatory Visit: Payer: Self-pay | Admitting: Family Medicine

## 2022-12-30 ENCOUNTER — Ambulatory Visit
Admission: RE | Admit: 2022-12-30 | Discharge: 2022-12-30 | Disposition: A | Discharge status: Home / Self Care | Payer: Medicare Other | Source: Ambulatory Visit | Attending: Family Medicine | Admitting: Family Medicine

## 2022-12-30 DIAGNOSIS — R053 Chronic cough: Secondary | ICD-10-CM | POA: Diagnosis not present

## 2022-12-30 DIAGNOSIS — R918 Other nonspecific abnormal finding of lung field: Secondary | ICD-10-CM | POA: Diagnosis not present

## 2023-01-27 ENCOUNTER — Other Ambulatory Visit: Payer: Self-pay | Admitting: Family Medicine

## 2023-01-27 DIAGNOSIS — Z87891 Personal history of nicotine dependence: Secondary | ICD-10-CM

## 2023-04-28 ENCOUNTER — Ambulatory Visit
Admission: RE | Admit: 2023-04-28 | Discharge: 2023-04-28 | Disposition: A | Discharge status: Home / Self Care | Payer: Medicare Other | Source: Ambulatory Visit | Attending: Family Medicine | Admitting: Family Medicine

## 2023-04-28 DIAGNOSIS — F17210 Nicotine dependence, cigarettes, uncomplicated: Secondary | ICD-10-CM | POA: Diagnosis not present

## 2023-04-28 DIAGNOSIS — Z122 Encounter for screening for malignant neoplasm of respiratory organs: Secondary | ICD-10-CM | POA: Diagnosis not present

## 2023-04-28 DIAGNOSIS — Z87891 Personal history of nicotine dependence: Secondary | ICD-10-CM

## 2023-06-24 DIAGNOSIS — Z139 Encounter for screening, unspecified: Secondary | ICD-10-CM | POA: Diagnosis not present

## 2023-06-24 DIAGNOSIS — I69351 Hemiplegia and hemiparesis following cerebral infarction affecting right dominant side: Secondary | ICD-10-CM | POA: Diagnosis not present

## 2023-06-24 DIAGNOSIS — M109 Gout, unspecified: Secondary | ICD-10-CM | POA: Diagnosis not present

## 2023-06-24 DIAGNOSIS — E7800 Pure hypercholesterolemia, unspecified: Secondary | ICD-10-CM | POA: Diagnosis not present

## 2023-06-24 DIAGNOSIS — I739 Peripheral vascular disease, unspecified: Secondary | ICD-10-CM | POA: Diagnosis not present

## 2023-06-24 DIAGNOSIS — E1140 Type 2 diabetes mellitus with diabetic neuropathy, unspecified: Secondary | ICD-10-CM | POA: Diagnosis not present

## 2023-06-24 DIAGNOSIS — Z23 Encounter for immunization: Secondary | ICD-10-CM | POA: Diagnosis not present

## 2023-06-24 DIAGNOSIS — I10 Essential (primary) hypertension: Secondary | ICD-10-CM | POA: Diagnosis not present

## 2023-06-24 DIAGNOSIS — N1832 Chronic kidney disease, stage 3b: Secondary | ICD-10-CM | POA: Diagnosis not present

## 2023-07-02 DIAGNOSIS — E875 Hyperkalemia: Secondary | ICD-10-CM | POA: Diagnosis not present

## 2023-09-29 DIAGNOSIS — M5459 Other low back pain: Secondary | ICD-10-CM | POA: Diagnosis not present

## 2024-01-07 ENCOUNTER — Ambulatory Visit: Admitting: Podiatry

## 2024-01-09 ENCOUNTER — Encounter: Payer: Self-pay | Admitting: Podiatry

## 2024-01-09 ENCOUNTER — Ambulatory Visit: Admitting: Podiatry

## 2024-01-09 DIAGNOSIS — M21621 Bunionette of right foot: Secondary | ICD-10-CM

## 2024-01-09 DIAGNOSIS — E1140 Type 2 diabetes mellitus with diabetic neuropathy, unspecified: Secondary | ICD-10-CM | POA: Diagnosis not present

## 2024-01-09 DIAGNOSIS — M79674 Pain in right toe(s): Secondary | ICD-10-CM

## 2024-01-09 DIAGNOSIS — E1149 Type 2 diabetes mellitus with other diabetic neurological complication: Secondary | ICD-10-CM | POA: Diagnosis not present

## 2024-01-09 DIAGNOSIS — M79675 Pain in left toe(s): Secondary | ICD-10-CM

## 2024-01-09 DIAGNOSIS — L84 Corns and callosities: Secondary | ICD-10-CM | POA: Diagnosis not present

## 2024-01-09 DIAGNOSIS — B351 Tinea unguium: Secondary | ICD-10-CM

## 2024-01-12 NOTE — Progress Notes (Signed)
 Subjective:   Patient ID: [name], male   DOB: 69 y.o.   MRN: [id number]   HPI Patient presents having had long-term issues with nails and lesions does have diabetes that he tries to keep under good control does get some tingling and burning in his feet in general.  Patient still smokes and smoking approximately a pack a day   Review of Systems  All other systems reviewed and are negative.       Objective:  Physical Exam Vitals and nursing note reviewed.  Constitutional:      Appearance: He is well-developed.  Pulmonary:     Effort: Pulmonary effort is normal.  Musculoskeletal:        General: Normal range of motion.  Skin:    General: Skin is warm.  Neurological:     Mental Status: He is alert.     Vascular intact neurologically diminishment of sharp dull vibratory bilateral.  Patient is noted to have chronic keratotic lesion subfirst and fifth metatarsal of both feet and also thickened nailbeds 1-5 both feet with incurvated corners.  He does have structural deformity moderate bunion tailor's bunion deformity bilateral and has good digital perfusion well-oriented x 3     Assessment:  Patient with numerous conditions foot structure issues mycotic nail infection lesion formation pain and moderate diabetic neuropathy     Plan:  H&P all conditions reviewed discussed and I did review with him diabetic education and daily inspections.  Sharp sterile debridement of lesions bilateral no iatrogenic bleeding debridement of nailbeds 1-5 both feet no iatrogenic bleeding and will be seen back for routine care or if any other issues were to occur

## 2024-03-02 NOTE — Progress Notes (Signed)
 [name]                                          MRN: [id number]   03/02/2024   The [facility] Quality Team Specialist reviewed this patient medical record for the purposes of chart review for care gap closure. The following were reviewed: chart review for care gap closure-glycemic status assessment.    [facility] Quality Team
